# Patient Record
Sex: Female | Born: 1965
Health system: Southern US, Community
[De-identification: ages and names within clinical notes are randomized; demographics above are authoritative.]

## PROBLEM LIST (undated history)

## (undated) DIAGNOSIS — Z72820 Sleep deprivation: Secondary | ICD-10-CM

## (undated) DIAGNOSIS — K589 Irritable bowel syndrome without diarrhea: Secondary | ICD-10-CM

## (undated) DIAGNOSIS — R5382 Chronic fatigue, unspecified: Secondary | ICD-10-CM

## (undated) DIAGNOSIS — K648 Other hemorrhoids: Secondary | ICD-10-CM

## (undated) DIAGNOSIS — N3010 Interstitial cystitis (chronic) without hematuria: Secondary | ICD-10-CM

## (undated) DIAGNOSIS — G8929 Other chronic pain: Secondary | ICD-10-CM

## (undated) DIAGNOSIS — M6289 Other specified disorders of muscle: Secondary | ICD-10-CM

## (undated) DIAGNOSIS — K219 Gastro-esophageal reflux disease without esophagitis: Secondary | ICD-10-CM

## (undated) DIAGNOSIS — F32A Depression, unspecified: Secondary | ICD-10-CM

## (undated) DIAGNOSIS — C539 Malignant neoplasm of cervix uteri, unspecified: Secondary | ICD-10-CM

## (undated) DIAGNOSIS — I10 Essential (primary) hypertension: Secondary | ICD-10-CM

## (undated) DIAGNOSIS — M797 Fibromyalgia: Secondary | ICD-10-CM

## (undated) DIAGNOSIS — F329 Major depressive disorder, single episode, unspecified: Secondary | ICD-10-CM

## (undated) HISTORY — DX: Irritable bowel syndrome without diarrhea: K58.9

## (undated) HISTORY — PX: BLADDER SURGERY: SHX569

## (undated) HISTORY — DX: Essential (primary) hypertension: I10

## (undated) HISTORY — DX: Interstitial cystitis (chronic) without hematuria: N30.10

## (undated) HISTORY — DX: Gastro-esophageal reflux disease without esophagitis: K21.9

## (undated) HISTORY — DX: Sleep deprivation: Z72.820

## (undated) HISTORY — DX: Fibromyalgia: M79.7

## (undated) HISTORY — DX: Chronic fatigue, unspecified: R53.82

## (undated) HISTORY — DX: Malignant neoplasm of cervix uteri, unspecified: C53.9

## (undated) HISTORY — DX: Major depressive disorder, single episode, unspecified: F32.9

## (undated) HISTORY — DX: Other specified disorders of muscle: M62.89

## (undated) HISTORY — PX: ABDOMINAL HYSTERECTOMY: SHX81

## (undated) HISTORY — DX: Irritable bowel syndrome, unspecified: K58.9

## (undated) HISTORY — PX: APPENDECTOMY: SHX54

## (undated) HISTORY — DX: Depression, unspecified: F32.A

## (undated) HISTORY — DX: Other hemorrhoids: K64.8

## (undated) HISTORY — DX: Other chronic pain: G89.29

---

## 1998-12-06 ENCOUNTER — Emergency Department (HOSPITAL_COMMUNITY): Admission: EM | Admit: 1998-12-06 | Discharge: 1998-12-06 | Payer: Self-pay | Admitting: Emergency Medicine

## 1998-12-06 ENCOUNTER — Encounter: Payer: Self-pay | Admitting: Emergency Medicine

## 2001-08-15 ENCOUNTER — Encounter: Payer: Self-pay | Admitting: Urology

## 2001-08-15 ENCOUNTER — Encounter: Admission: RE | Admit: 2001-08-15 | Discharge: 2001-08-15 | Payer: Self-pay | Admitting: Urology

## 2001-12-09 ENCOUNTER — Ambulatory Visit (HOSPITAL_BASED_OUTPATIENT_CLINIC_OR_DEPARTMENT_OTHER): Admission: RE | Admit: 2001-12-09 | Discharge: 2001-12-09 | Payer: Self-pay | Admitting: Urology

## 2001-12-09 ENCOUNTER — Encounter (INDEPENDENT_AMBULATORY_CARE_PROVIDER_SITE_OTHER): Payer: Self-pay | Admitting: *Deleted

## 2004-10-21 ENCOUNTER — Ambulatory Visit (HOSPITAL_COMMUNITY): Admission: RE | Admit: 2004-10-21 | Discharge: 2004-10-21 | Payer: Self-pay | Admitting: Urology

## 2004-10-21 ENCOUNTER — Ambulatory Visit (HOSPITAL_BASED_OUTPATIENT_CLINIC_OR_DEPARTMENT_OTHER): Admission: RE | Admit: 2004-10-21 | Discharge: 2004-10-21 | Payer: Self-pay | Admitting: Urology

## 2007-05-20 ENCOUNTER — Ambulatory Visit (HOSPITAL_BASED_OUTPATIENT_CLINIC_OR_DEPARTMENT_OTHER): Admission: RE | Admit: 2007-05-20 | Discharge: 2007-05-20 | Payer: Self-pay | Admitting: Urology

## 2008-08-07 DIAGNOSIS — I10 Essential (primary) hypertension: Secondary | ICD-10-CM | POA: Diagnosis present

## 2010-01-07 ENCOUNTER — Ambulatory Visit: Payer: Self-pay | Admitting: Internal Medicine

## 2010-01-23 ENCOUNTER — Ambulatory Visit: Payer: Self-pay | Admitting: Internal Medicine

## 2010-01-23 ENCOUNTER — Ambulatory Visit (HOSPITAL_COMMUNITY): Admission: RE | Admit: 2010-01-23 | Discharge: 2010-01-23 | Payer: Self-pay | Admitting: Internal Medicine

## 2010-04-02 ENCOUNTER — Encounter: Payer: Self-pay | Admitting: Cardiology

## 2010-04-17 ENCOUNTER — Encounter: Payer: Self-pay | Admitting: Cardiology

## 2010-05-03 ENCOUNTER — Encounter: Payer: Self-pay | Admitting: Cardiology

## 2010-05-26 ENCOUNTER — Encounter: Payer: Self-pay | Admitting: Cardiology

## 2010-05-27 ENCOUNTER — Ambulatory Visit (INDEPENDENT_AMBULATORY_CARE_PROVIDER_SITE_OTHER): Payer: Medicare Other | Admitting: Cardiology

## 2010-05-27 ENCOUNTER — Encounter: Payer: Self-pay | Admitting: Cardiology

## 2010-05-27 DIAGNOSIS — I498 Other specified cardiac arrhythmias: Secondary | ICD-10-CM

## 2010-05-27 DIAGNOSIS — R002 Palpitations: Secondary | ICD-10-CM

## 2010-05-27 DIAGNOSIS — R000 Tachycardia, unspecified: Secondary | ICD-10-CM | POA: Insufficient documentation

## 2010-05-27 DIAGNOSIS — R001 Bradycardia, unspecified: Secondary | ICD-10-CM | POA: Insufficient documentation

## 2010-05-27 DIAGNOSIS — I10 Essential (primary) hypertension: Secondary | ICD-10-CM | POA: Insufficient documentation

## 2010-05-27 NOTE — Progress Notes (Addendum)
Clinical Summary Carol Thompson is a 45 y.o.female referred for cardiology consultation. She reports a history of hypertension dating back over the last several years, actually evaluated by a hypertension specialist at [facility] back in 2009, by her report. She states that she has been on a combination of Toprol-XL, Cardizem CD, and Maxzide since that time, at different doses over the years. It seems the doses have changed based on both fluctuating heart rate and blood pressure concerns.  She reports a general sense of palpitations, describing fairly sudden onset of fast heartbeats that occur at rest, nearly on a daily basis. She also has occasional dizziness, reports syncope at times, although the description almost sounds more neurocardiogenic in etiology. She states that when her heart rate is in the "40s" she feels dizzy, although on the other hand when she has passed out at times, it seems to be situationally related, and not necessarily associated with frank hypotension.  More recently, she has noted slow heart rates resulting in a change in her Cardizem CD dosing to 120 mg p.o. T.i.d., rather than all at one dose. She seems to be tolerating this better. She states that her systolic blood pressure is typically no higher than "140" and that her diastolic blood pressure is typically in the "90s." She reports no exertional chest pain or unusual shortness of breath, although indicates that she is functionally limited related to her chronic illnesses and pain. She does not exercise regularly.  Today we discussed the importance of sodium restriction and diet as it relates to her blood pressure control. I reviewed her ECG, which shows no clear evidence of conduction system disease. We also discussed placing a Holter monitor to get a better sense of her true heart rate variability, as this may help Dr. Dimas Aguas to further craft her medical regimen going forward. I note that she has undergone screening for  pheochromocytoma, with negative results.  Allergies  Allergen Reactions  . Ciprofloxacin     Current Outpatient Prescriptions on File Prior to Visit  Medication Sig Dispense Refill  . albuterol (VENTOLIN HFA) 108 (90 BASE) MCG/ACT inhaler Inhale 2 puffs into the lungs every 6 (six) hours as needed.        . ALPRAZolam (XANAX) 1 MG tablet Take 1/2 by mouth in morning and afternoon, and 1 at bedtime       . amitriptyline (ELAVIL) 25 MG tablet Take 25 mg by mouth at bedtime.        Marland Kitchen APAP-Isometheptene-Dichloral (MIDRIN) 325-65-100 MG CAPS As needed        . baclofen (LIORESAL) 20 MG tablet Take 20 mg by mouth 3 (three) times daily.        Marland Kitchen estradiol (ESTRACE) 2 MG tablet Take 2 mg by mouth daily.        . Fluticasone-Salmeterol (ADVAIR DISKUS) 100-50 MCG/DOSE AEPB Inhale 1 puff into the lungs every 12 (twelve) hours.        . furosemide (LASIX) 20 MG tablet Take one by mouth daily        . Lidocaine 0.5 % GEL Apply topically.        . metoprolol (TOPROL-XL) 100 MG 24 hr tablet Take 100 mg by mouth daily.        Marland Kitchen omeprazole (PRILOSEC) 20 MG capsule Take one by mouth twice daily        . oxyCODONE (OXYCONTIN) 20 MG 12 hr tablet Take one by mouth three times daily        .  pentosan polysulfate (ELMIRON) 100 MG capsule Take one by mouth four times daily        . sodium chloride 0.9 % SOLN 50 mL with bcg vaccine 81 MG/VIAL SUSR 81 mg 2 times a day       . triamterene-hydrochlorothiazide (MAXZIDE-25) 37.5-25 MG per tablet Take 1 tablet by mouth daily.        Marland Kitchen trimethoprim (TRIMPEX) 100 MG tablet Take one by mouth daily        . zolpidem (AMBIEN) 10 MG tablet Take 10 mg by mouth at bedtime as needed.        Marland Kitchen DISCONTD: diltiazem (CARDIZEM CD) 360 MG 24 hr capsule Take 360 mg by mouth daily.          Past Medical History  Diagnosis Date  . Cystitis, interstitial   . Fibromyalgia   . Hypertension   . Migraine   . Sleep deprivation   . Acid reflux   . Asthma   . Pelvic floor dysfunction    . Vulvodynia   . Chronic pain   . Chronic fatigue   . IBS (irritable bowel syndrome)   . Internal hemorrhoids     Colonoscopy 11/11 - Dr. Karilyn Cota   No past surgical history on file.  Family History  Problem Relation Age of Onset  . Coronary artery disease Father   . Lung cancer Father   . Kidney failure Mother     s/p renal transplantation  . Parkinsonism Brother     Social History Ms. Romaniello reports that she has never smoked. She has never used smokeless tobacco. Ms. Asher reports that she does not drink alcohol.  Review of Systems No fevers, chills, or unusual weight change. No chest pain, progressive shortness of breath, cough, hemoptysis, or wheezing. No dysphasia or odynophagia. Stable appetite with intermittent abdominal pain, No recent melena or hematochezia. No orthopnea or PND. Occasional mild dependent lower extremity edema. She reports intermittent bladder and pelvic pain. No focal motor weakness, memory problems, or speech deficits. Otherwise systems reviewed and negative except as already outlined.  Physical Examination Filed Vitals:   05/27/10 0911  BP: 131/85  Pulse: 80  Overweight woman in no acute distress. HEENT: Conjunctiva and lids normal, oropharynx clear. Neck: Supple, no elevated JVP or carotid bruits, no thyromegaly. Lungs: Clear to auscultation, nonlabored. Cardiac: Regular rate and rhythm, no significant systolic murmur, no gallop or rub. Abdomen: Soft, nontender, bowel sounds present. Skin: Warm and dry. Musculoskeletal: No kyphosis. Extremities: No pitting edema, distal pulses 1-2+. Neuropsychiatric: Alert and oriented x3, affect grossly appropriate.  ECG Normal sinus rhythm at 74 beats per minute with normal intervals.  Problem List and Plan Bradycardia Patient reports bradycardia, heart rates down to the 40s and 50s at times, associated with dizziness. Her heart rate today is essentially normal as is her ECG, showing no clear evidence of  conduction system disease. Certainly, she is on medications that would slow her heart rate, although I suspect that some of her previously described syncopal events are actually more neurocardiogenic in etiology. As noted above, a Holter monitor will be placed to get a better sense of true heart rate variability, and to exclude any pauses.  Unspecified essential hypertension Today's blood pressure is only mildly elevated, and it sounds as if Dr. Dimas Aguas has made careful medication adjustments over time. I am therefore reluctant to make any medication modifications empirically today. Once more heart rate variability information is available, it may be that this will provide useful  information regarding future medication adjustments. Otherwise I did discuss diet and sodium restriction with her.  Palpitations She has a reported history of "tachycardia" based on record review, and she does describe episodic palpitations. It is not clear whether this represents sinus tachycardia, or perhaps some other SVT. A Holter monitor will be placed to get a better sense of this, and her heart rate variability. No medication changes will be made at this point. Our office will contact her with the results, and these will be forwarded to Dr. Dimas Aguas.

## 2010-05-27 NOTE — Assessment & Plan Note (Signed)
She has a reported history of "tachycardia" based on record review, and she does describe episodic palpitations. It is not clear whether this represents sinus tachycardia, or perhaps some other SVT. A Holter monitor will be placed to get a better sense of this, and her heart rate variability. No medication changes will be made at this point. Our office will contact her with the results, and these will be forwarded to Dr. Dimas Aguas.

## 2010-05-27 NOTE — Patient Instructions (Signed)
Your physician has recommended that you wear a holter monitor. Holter monitors are medical devices that record the heart's electrical activity. Doctors most often use these monitors to diagnose arrhythmias. Arrhythmias are problems with the speed or rhythm of the heartbeat. The monitor is a small, portable device. You can wear one while you do your normal daily activities. This is usually used to diagnose what is causing palpitations/syncope (passing out). Your follow up will be based on your test results. If the results of your test are normal or stable, you will receive a letter. If they are abnormal, the nurse will contact you by phone.

## 2010-05-27 NOTE — Assessment & Plan Note (Addendum)
Today's blood pressure is only mildly elevated, and it sounds as if Dr. Dimas Aguas has made careful medication adjustments over time. I am therefore reluctant to make any medication modifications empirically today. Once more heart rate variability information is available, it may be that this will provide useful information regarding future medication adjustments. Otherwise I did discuss diet and sodium restriction with her.

## 2010-05-27 NOTE — Assessment & Plan Note (Signed)
Patient reports bradycardia, heart rates down to the 40s and 50s at times, associated with dizziness. Her heart rate today is essentially normal as is her ECG, showing no clear evidence of conduction system disease. Certainly, she is on medications that would slow her heart rate, although I suspect that some of her previously described syncopal events are actually more neurocardiogenic in etiology. As noted above, a Holter monitor will be placed to get a better sense of true heart rate variability, and to exclude any pauses.

## 2010-06-05 NOTE — Letter (Signed)
Summary: External Correspondence/  [facility]  External Correspondence/  [facility]   Imported By: Dorise Hiss 05/26/2010 16:17:02  _____________________________________________________________________  External Attachment:    Type:   Image     Comment:   External Document

## 2010-06-05 NOTE — Medication Information (Signed)
Summary: RX Folder/  [facility]  RX Folder/  [facility]   Imported By: Dorise Hiss 05/26/2010 16:20:30  _____________________________________________________________________  External Attachment:    Type:   Image     Comment:   External Document

## 2010-06-05 NOTE — Letter (Signed)
Summary: External Correspondence/  [facility]  External Correspondence/  [facility]   Imported By: Dorise Hiss 05/26/2010 16:15:27  _____________________________________________________________________  External Attachment:    Type:   Image     Comment:   External Document

## 2010-06-05 NOTE — Letter (Signed)
Summary: External Correspondence/  [facility]  External Correspondence/  [facility]   Imported By: Dorise Hiss 05/26/2010 16:14:09  _____________________________________________________________________  External Attachment:    Type:   Image     Comment:   External Document

## 2010-07-03 DIAGNOSIS — D649 Anemia, unspecified: Secondary | ICD-10-CM | POA: Insufficient documentation

## 2010-07-03 DIAGNOSIS — K219 Gastro-esophageal reflux disease without esophagitis: Secondary | ICD-10-CM

## 2010-07-03 DIAGNOSIS — G5600 Carpal tunnel syndrome, unspecified upper limb: Secondary | ICD-10-CM | POA: Insufficient documentation

## 2010-07-03 DIAGNOSIS — N946 Dysmenorrhea, unspecified: Secondary | ICD-10-CM | POA: Insufficient documentation

## 2010-07-03 DIAGNOSIS — K5900 Constipation, unspecified: Secondary | ICD-10-CM | POA: Insufficient documentation

## 2010-07-22 NOTE — Op Note (Signed)
Carol Thompson, Carol Thompson NO.:  [id number]   MEDICAL RECORD NO.:  [id number]          PATIENT TYPE:  AMB   LOCATION:  [facility]                         FACILITY:  [facility]   PHYSICIAN:  Jamison Neighbor, M.D.  DATE OF BIRTH:  05-12-1965   DATE OF PROCEDURE:  05/20/2007  DATE OF DISCHARGE:                               OPERATIVE REPORT   PREOPERATIVE DIAGNOSES:  1. Interstitial cystitis.  2. Urgency incontinence.   POSTOPERATIVE DIAGNOSES:  1. Interstitial cystitis.  2. Urgency incontinence.   PROCEDURE:  Cystoscopy, urethral calibration, hydrodistention of the  bladder, Marcaine and Pyridium installation, Marcaine and Kenalog  injection, Botox injections x2.   SURGEON:  Dr. Logan Bores.   ANESTHESIA:  Was general.   COMPLICATIONS:  None.   DRAINS:  None.   BRIEF HISTORY:  This patient has had longstanding problems with not only  interstitial cystitis and associated pelvic pain but very poorly  controlled urgency and frequency.  She has failed to respond  particularly well to oral therapy or installation therapy.  She has  requested repeat hydrodistention be performed, and she has had good  response to that in the past, so far as pain is concerned.  She is also  interested in a trial of Botox injections for her frequency and urgency.  We discussed the various options including neural modulation therapy as  well as ongoing anticholinergic therapy, but she would like to try the  Botox injection.  She had been fully appraised as to the potential risk  of urinary retention but does realize that the Botox will wear off in  the range of 4 to 6 months and that any problem that she has with  retention will certainly not be permanent.  She is willing to self-  catheterization if indicated.  The patient understands the risks and  benefits of the procedure and gave full informed consent.   PROCEDURE:  After successful induction of general anesthesia, the  patient was placed in  the dorsal position, prepped with Betadine, and  draped in the usual sterile fashion.  Careful bimanual examination  revealed an unremarkable urethra.  There were no signs of the  diverticula.  The patient had minimal cystocele.  She had no vault  prolapse or rectocele.  There was no enterocele.  There were no palpable  masses of any kind.  Urethra was calibrated to 32-French with female  urethral sounds with no evidence of stenosis or stricture.  The  cystoscope was inserted.  The bladder was carefully inspected.  No  tumors or stones could be seen.  An old biopsy site was identified but  was unremarkable.  The urine coming out of each ureter was clear .  Hydrodistention of bladder was performed.  The bladder was distended at  a pressure of 170 units of water for 5 minutes.  When the bladder was  drained, glomerulations could be seen throughout the bladder.  Bladder  capacity of 475 was slightly less than the average IC capacity of 575  and markedly less than the expected normal bladder capacity of 1150 mL  under anesthesia.  The patient had nothing that required biopsy.  There  was no sign of any Hunner's ulcers that would require fulguration.  The  patient underwent Botox injection.  She received a total of 2 ampules  injected throughout the base of the bladder.  The patient tolerated the  procedure without difficulty.  Marcaine and Pyridium were left in the  bladder.  Marcaine and Kenalog were injected periurethrally.  The  patient tolerated the procedure well and was taken to the recovery in  good condition.  She was given Pyridium Plus for symptom management as  well as doxycycline for antibiotic coverage.  She will return to see me  in follow-up in 2 weeks' time.  The patient did receive intraoperative  Toradol, Zofran, and a B&O suppository for symptom management.      Jamison Neighbor, M.D.  Electronically Signed     RJE/MEDQ  D:  05/20/2007  T:  05/21/2007  Job:  [id number]

## 2010-07-25 NOTE — Op Note (Signed)
NAME:  Carol Thompson, Carol Thompson                         ACCOUNT NO.:  [id number]   MEDICAL RECORD NO.:  [id number]                   PATIENT TYPE:  AMB   LOCATION:  [facility]                                 FACILITY:  [facility]   PHYSICIAN:  Jamison Neighbor, M.D.               DATE OF BIRTH:  04-Jul-1965   DATE OF PROCEDURE:  12/09/2001  DATE OF DISCHARGE:                                 OPERATIVE REPORT   SERVICE:  Urology.   PREOPERATIVE DIAGNOSES:  Chronic pelvic pain, rule out interstitial  cystitis.   POSTOPERATIVE DIAGNOSES:  Interstitial cystitis.   PROCEDURE:  Cystoscopy, urethral calibration, hydrodistention of the  bladder, Marcaine and Pyridium installation, Marcaine and Kenalog injection.   SURGEON:  Jamison Neighbor, M.D.   ANESTHESIA:  General.   COMPLICATIONS:  None.   DRAINS:  None.   BRIEF HISTORY:  This 45 year old female has had lower urinary tract  symptoms, she had been on antibiotic suppression and is no longer infected  but still has no change in her urgency, frequency, and pelvic pain. The  patient is to undergo diagnostic cystoscopy under anesthesia. She is aware  that this is primarily being done to confirm a diagnosis of IC.  She was  given the opportunity to alternative forms of therapy such as potassium  provocation. The patient understands that there is no guarantee that she  will have any relief from this procedure but there is certainly a reasonably  chance that she may have relief that can last as long as several months, she  gave full and informed consent for the procedure.   DESCRIPTION OF PROCEDURE:  After successful induction of general anesthesia,  the patient was placed in the dorsal lithotomy position, prepped with  Betadine and draped in the usual sterile fashion. Careful bimanual  examination revealed no cystocele, rectocele or enterocele. There were no  masses on bimanual exam. There was no evidence of diverticulum. The urethra  was calibrated to  a 11 [place] with female urethral sounds with no evidence  of stenosis or stricture. The cystoscope was inserted, the bladder was  carefully inspected and was free of any tumor or stones. Both ureteral  orifices were normal in configuration and location. The patient underwent  hydrodistention at a pressure of 100 cm of water for five minutes when the  bladder was drained. Glomerulations could seen throughout the bladder and  bladder capacity was 700 cc. The patient had a bladder biopsy done. This  biopsy was sent for mast cell analysis. The biopsy site was cauterized, the  bladder was drained and a mixture of Marcaine and Pyridium was left within  the bladder, Marcaine and Kenalog were injected periurethrally. The patient  received intraoperative  Toradol and Zofran as well as a B&O suppository. She will be sent home with  Lorcet 10 and UroMax and she will continue on her Macrodantin for  prophylaxis. When she returns  to the office in follow-up, she will be given  the opportunity for installation therapy or oral therapy for the management  of her condition.                                               Jamison Neighbor, M.D.    RJE/MEDQ  D:  12/09/2001  T:  12/09/2001  Job:  [id number]   cc:   Selinda Flavin  [address]  [place]  [place] [address]  Fax: [phone]

## 2010-07-25 NOTE — Op Note (Signed)
Carol Thompson, BINEGAR NO.:  [id number]   MEDICAL RECORD NO.:  [id number]          PATIENT TYPE:  AMB   LOCATION:  [facility]                         [facility]ACILITY:  [facility]   PHYSICIAN:  Jamison Neighbor, M.D.  DATE OF BIRTH:  1965/10/09   DATE OF PROCEDURE:  10/21/2004  DATE OF DISCHARGE:                                 OPERATIVE REPORT   SERVICE:  Urology.   PREOPERATIVE DIAGNOSES:  Interstitial cystitis.   POSTOPERATIVE DIAGNOSES:  Interstitial cystitis.   PROCEDURE:  Cystoscopy, urethral calibration, hydrodistention of the  bladder, Marcaine and Pyridium installation, Marcaine and Kenalog injection.   SURGEON:  Jamison Neighbor, M.D.   ANESTHESIA:  General.   COMPLICATIONS:  None.   DRAINS:  None.   BRIEF HISTORY:  This 45 year old female has well documented interstitial  cystitis having undergone previous cystoscopy and hydrodistention with good  results. The patient has been on a combination of Elmiron and Atarax along  with pain management and has done reasonably well but has had deterioration  in her symptoms. She has requested a repeat hydrodistention be performed to  determine if there has been any change in her bladder as well as to  hopefully decrease her pain. The patient understands the risks and benefits  of the procedure and gave full and informed consent.   DESCRIPTION OF PROCEDURE:  After successful induction of general anesthesia,  the patient was placed in the dorsal lithotomy position, prepped with  Betadine and draped in the usual sterile fashion. Careful bimanual  examination revealed no significant cystocele, rectocele or enterocele.  There were no masses on bimanual exam. The urethra was calibrated to 63  [place] with female urethral sounds with no evidence of stenosis or  stricture. The cystoscope was inserted, the bladder was carefully inspected  and was free of any tumor or stones. Both ureteral orifices were normal in  configuration and  location. There is very minimal unremarkable appearing  squamous metaplasia in the midline. The urine coming out of each ureter was  clear. Hydrodistention of the bladder was then performed, the bladder was  distended at a pressure of 100 cmH2O for 5 minutes and when the bladder was  drained, glomerulations could be seen but they are significantly less than  had been seen previously. The bladder capacity is now at 700 mL which is a  little better than the average for interstitial cystitis which is 575 but  somewhat less than  the normal bladder capacity of 1150. The bladder was drained and a mixture  of Marcaine and Pyridium was left within the bladder,  Marcaine and Kenalog  mixture was injected periurethrally. The patient tolerated the procedure  well and was taken to the recovery room in good condition.           ______________________________  Jamison Neighbor, M.D.  Electronically Signed     RJE/MEDQ  D:  10/21/2004  T:  10/21/2004  Job:  [id number]   cc:   Selinda Flavin  [address]  [place]  [place] [address]  Fax: [phone]

## 2010-12-01 LAB — POCT HEMOGLOBIN-HEMACUE: Hemoglobin: 13.2

## 2010-12-17 DIAGNOSIS — N3941 Urge incontinence: Secondary | ICD-10-CM | POA: Insufficient documentation

## 2011-04-30 DIAGNOSIS — R079 Chest pain, unspecified: Secondary | ICD-10-CM | POA: Insufficient documentation

## 2011-05-07 ENCOUNTER — Encounter: Payer: Self-pay | Admitting: Family Medicine

## 2011-05-07 DIAGNOSIS — R079 Chest pain, unspecified: Secondary | ICD-10-CM

## 2011-06-02 ENCOUNTER — Ambulatory Visit (INDEPENDENT_AMBULATORY_CARE_PROVIDER_SITE_OTHER): Payer: Medicare Other | Admitting: Cardiology

## 2011-06-02 ENCOUNTER — Encounter: Payer: Self-pay | Admitting: Cardiology

## 2011-06-02 VITALS — BP 109/76 | HR 71 | Ht 61.0 in | Wt 148.0 lb

## 2011-06-02 DIAGNOSIS — R002 Palpitations: Secondary | ICD-10-CM

## 2011-06-02 DIAGNOSIS — R0789 Other chest pain: Secondary | ICD-10-CM | POA: Insufficient documentation

## 2011-06-02 DIAGNOSIS — R079 Chest pain, unspecified: Secondary | ICD-10-CM

## 2011-06-02 DIAGNOSIS — I10 Essential (primary) hypertension: Secondary | ICD-10-CM

## 2011-06-02 NOTE — Assessment & Plan Note (Signed)
As noted above, patient feels that these are more prominent. No actual syncope.

## 2011-06-02 NOTE — Assessment & Plan Note (Signed)
Etiology not clear. ECG is reviewed and overall nonspecific. Recent Lexiscan Cardiolite demonstrated no active ischemia. We plan to obtain an event recorder for 7 days to exclude any recurring arrhythmia that might be related in light of her progressive sense of palpitations and fatigue, also a complete echocardiogram for assessment of cardiac structure and function as well as valvular status, also to exclude elevated pulmonary pressures. Office follow up arranged to review.

## 2011-06-02 NOTE — Patient Instructions (Signed)
Follow up in 1 month. Your physician recommends that you continue on your current medications as directed. Please refer to the Current Medication list given to you today. Your physician has requested that you have an echocardiogram. Echocardiography is a painless test that uses sound waves to create images of your heart. It provides your doctor with information about the size and shape of your heart and how well your heart's chambers and valves are working. This procedure takes approximately one hour. There are no restrictions for this procedure. Your physician has recommended that you wear an event monitor. Event monitors are medical devices that record the heart's electrical activity. Doctors most often [place] these monitors to diagnose arrhythmias. Arrhythmias are problems with the speed or rhythm of the heartbeat. The monitor is a small, portable device. You can wear one while you do your normal daily activities. This is usually used to diagnose what is causing palpitations/syncope (passing out). If the results of your test are normal or stable, you will receive a letter. If they are abnormal, the nurse will contact you by phone.

## 2011-06-02 NOTE — Progress Notes (Signed)
Clinical Summary Carol Thompson is a 46 y.o.female referred back to the office by Dr. Dimas Aguas for evaluation of chest pain and palpitations. She was seen with palpitations in March of last year at which time she wore a 48 hour Holter monitor, read by Dr. Andee Lineman to show sinus rhythm with no significant arrhythmias or pauses. She had only rare PACs, heart rates noted in the high 40s to 50s at times in sinus rhythm.  I see that she was also evaluated by Dr. Dimas Aguas with a recent [facility] in February of this year. This study revealed no evidence of ischemia with normal LVEF. Lab work at that time showed BUN 13, creatinine 0.6, sodium 139, potassium 4.1, AST 16, ALT 14, hemoglobin 13.7, platelets 232, cholesterol 206, triglycerides 116, HDL 90, LDL 93, TSH 1.2, ESR normal.  She states that she experienced a chest discomfort in the evening several weeks ago, followed by feeling of fatigue that has persisted since February when she originally saw Dr. Dimas Aguas. She has also had intermittent palpitations. Chest pain symptoms are new by report, somewhat sharp in quality. She reports no cough or hemoptysis.  Today we reviewed the results of her recent testing as well as prior cardiac monitor. She has not had a recent echocardiogram, indicates her palpitations are worse.   Allergies  Allergen Reactions  . Ciprofloxacin Rash    Current Outpatient Prescriptions  Medication Sig Dispense Refill  . albuterol (VENTOLIN HFA) 108 (90 BASE) MCG/ACT inhaler Inhale 2 puffs into the lungs every 6 (six) hours as needed.        . ALPRAZolam (XANAX) 0.5 MG tablet Take 0.5 mg by mouth 2 (two) times daily.      . baclofen (LIORESAL) 20 MG tablet Take 20 mg by mouth 3 (three) times daily.        . cyproheptadine (PERIACTIN) 4 MG tablet Take 4 mg by mouth daily.      Marland Kitchen diltiazem (CARDIZEM) 30 MG tablet Take 30 mg by mouth 3 (three) times daily.      Marland Kitchen estradiol (ESTRACE) 2 MG tablet Take 2 mg by mouth daily.        .  Fluticasone-Salmeterol (ADVAIR DISKUS) 100-50 MCG/DOSE AEPB Inhale 1 puff into the lungs every 12 (twelve) hours.        . furosemide (LASIX) 20 MG tablet Take 20 mg by mouth daily as needed.       . hydrOXYzine (ATARAX) 25 MG tablet Take 50 mg by mouth at bedtime.       . Lidocaine 0.5 % GEL Apply topically.        . metoprolol (TOPROL-XL) 100 MG 24 hr tablet Take 100 mg by mouth daily.        . NON FORMULARY 2 (two) times daily. ANESTHETIC COCKTAIL      . omeprazole (PRILOSEC) 20 MG capsule Take 20 mg by mouth daily.       Marland Kitchen oxyCODONE-acetaminophen (PERCOCET) 10-325 MG per tablet Take 1 tablet by mouth Every 4 hours as needed.      . phenazopyridine (PYRIDIUM) 200 MG tablet Take 200 mg by mouth 3 (three) times daily as needed.      . trimethoprim (TRIMPEX) 100 MG tablet Take one by mouth daily        . zolpidem (AMBIEN) 10 MG tablet Take 10 mg by mouth at bedtime as needed.          Past Medical History  Diagnosis Date  . Cystitis, interstitial   .  Fibromyalgia   . Hypertension   . Migraine   . Sleep deprivation   . Acid reflux   . Asthma   . Pelvic floor dysfunction   . Vulvodynia   . Chronic pain   . Chronic fatigue   . IBS (irritable bowel syndrome)   . Internal hemorrhoids     Colonoscopy 11/11 - Dr. Karilyn Cota    History reviewed. No pertinent past surgical history.  Family History  Problem Relation Age of Onset  . Coronary artery disease Father   . Lung cancer Father   . Kidney failure Mother     s/p renal transplantation  . Parkinsonism Brother     Social History Carol Thompson reports that she has never smoked. She has never used smokeless tobacco. Carol Thompson reports that she does not drink alcohol.  Review of Systems Reports chronic problems with intermittent bladder and pelvic pain. Has occasional mild ankle edema. Feels progressively fatigued. Has had no syncope, occasionally feels lightheaded with palpitations. No bleeding problems. No fevers or chills. Otherwise  negative.  Physical Examination Filed Vitals:   06/02/11 1537  BP: 109/76  Pulse: 71   Overweight woman in no acute distress.  HEENT: Conjunctiva and lids normal, oropharynx clear.  Neck: Supple, no elevated JVP or carotid bruits, no thyromegaly.  Lungs: Clear to auscultation, nonlabored.  Cardiac: Regular rate and rhythm, no significant systolic murmur, no gallop or rub.  Abdomen: Soft, nontender, bowel sounds present.  Skin: Warm and dry.  Musculoskeletal: No kyphosis.  Extremities: No pitting edema, distal pulses 1-2+.  Neuropsychiatric: Alert and oriented x3, affect grossly appropriate.   ECG Normal sinus rhythm at 80 beats per minute, possible right atrial enlargement.   Problem List and Plan

## 2011-06-02 NOTE — Assessment & Plan Note (Signed)
Blood pressure is normal today. She reports that medical therapy is unchanged.

## 2011-06-10 ENCOUNTER — Telehealth: Payer: Self-pay | Admitting: *Deleted

## 2011-06-10 NOTE — Telephone Encounter (Signed)
Pt's insurance information in Epic is incorrect. Pt was asked last week to bring updated insurance card so that insurance information in Epic would be correct and cardiac monitor could be ordered. Spoke with pt today who forgot to bring card. She will bring this tomorrow.

## 2011-06-16 DIAGNOSIS — R002 Palpitations: Secondary | ICD-10-CM

## 2011-06-18 ENCOUNTER — Other Ambulatory Visit: Payer: Self-pay

## 2011-06-18 ENCOUNTER — Other Ambulatory Visit (INDEPENDENT_AMBULATORY_CARE_PROVIDER_SITE_OTHER): Payer: Medicare Other

## 2011-06-18 DIAGNOSIS — R079 Chest pain, unspecified: Secondary | ICD-10-CM

## 2011-06-18 DIAGNOSIS — R002 Palpitations: Secondary | ICD-10-CM

## 2011-06-18 DIAGNOSIS — R0789 Other chest pain: Secondary | ICD-10-CM

## 2011-06-23 ENCOUNTER — Encounter: Payer: Self-pay | Admitting: *Deleted

## 2011-06-25 ENCOUNTER — Encounter: Payer: Self-pay | Admitting: *Deleted

## 2011-07-07 ENCOUNTER — Ambulatory Visit: Payer: Medicare Other | Admitting: Cardiology

## 2011-09-02 DIAGNOSIS — R350 Frequency of micturition: Secondary | ICD-10-CM | POA: Insufficient documentation

## 2011-09-02 DIAGNOSIS — K589 Irritable bowel syndrome without diarrhea: Secondary | ICD-10-CM | POA: Insufficient documentation

## 2011-10-09 DIAGNOSIS — Z8679 Personal history of other diseases of the circulatory system: Secondary | ICD-10-CM | POA: Insufficient documentation

## 2011-10-09 DIAGNOSIS — J45902 Unspecified asthma with status asthmaticus: Secondary | ICD-10-CM | POA: Insufficient documentation

## 2012-09-01 DIAGNOSIS — J019 Acute sinusitis, unspecified: Secondary | ICD-10-CM | POA: Insufficient documentation

## 2012-11-15 DIAGNOSIS — N390 Urinary tract infection, site not specified: Secondary | ICD-10-CM | POA: Insufficient documentation

## 2012-11-15 DIAGNOSIS — R300 Dysuria: Secondary | ICD-10-CM | POA: Insufficient documentation

## 2013-01-23 DIAGNOSIS — J309 Allergic rhinitis, unspecified: Secondary | ICD-10-CM | POA: Insufficient documentation

## 2013-03-28 ENCOUNTER — Emergency Department (HOSPITAL_COMMUNITY): Payer: Medicare PPO

## 2013-03-28 ENCOUNTER — Emergency Department (HOSPITAL_COMMUNITY)
Admission: EM | Admit: 2013-03-28 | Discharge: 2013-03-28 | Disposition: A | Discharge status: Home / Self Care | Payer: Medicare PPO | Attending: Emergency Medicine | Admitting: Emergency Medicine

## 2013-03-28 ENCOUNTER — Encounter (HOSPITAL_COMMUNITY): Payer: Self-pay | Admitting: Emergency Medicine

## 2013-03-28 DIAGNOSIS — Z87448 Personal history of other diseases of urinary system: Secondary | ICD-10-CM | POA: Insufficient documentation

## 2013-03-28 DIAGNOSIS — Y929 Unspecified place or not applicable: Secondary | ICD-10-CM | POA: Insufficient documentation

## 2013-03-28 DIAGNOSIS — G8929 Other chronic pain: Secondary | ICD-10-CM | POA: Insufficient documentation

## 2013-03-28 DIAGNOSIS — S61209A Unspecified open wound of unspecified finger without damage to nail, initial encounter: Secondary | ICD-10-CM | POA: Insufficient documentation

## 2013-03-28 DIAGNOSIS — S61218A Laceration without foreign body of other finger without damage to nail, initial encounter: Secondary | ICD-10-CM

## 2013-03-28 DIAGNOSIS — W260XXA Contact with knife, initial encounter: Secondary | ICD-10-CM | POA: Insufficient documentation

## 2013-03-28 DIAGNOSIS — Z23 Encounter for immunization: Secondary | ICD-10-CM | POA: Insufficient documentation

## 2013-03-28 DIAGNOSIS — G43909 Migraine, unspecified, not intractable, without status migrainosus: Secondary | ICD-10-CM | POA: Insufficient documentation

## 2013-03-28 DIAGNOSIS — Z8742 Personal history of other diseases of the female genital tract: Secondary | ICD-10-CM | POA: Insufficient documentation

## 2013-03-28 DIAGNOSIS — K219 Gastro-esophageal reflux disease without esophagitis: Secondary | ICD-10-CM | POA: Insufficient documentation

## 2013-03-28 DIAGNOSIS — Z79899 Other long term (current) drug therapy: Secondary | ICD-10-CM | POA: Insufficient documentation

## 2013-03-28 DIAGNOSIS — I10 Essential (primary) hypertension: Secondary | ICD-10-CM | POA: Insufficient documentation

## 2013-03-28 DIAGNOSIS — IMO0002 Reserved for concepts with insufficient information to code with codable children: Secondary | ICD-10-CM | POA: Insufficient documentation

## 2013-03-28 DIAGNOSIS — Z72820 Sleep deprivation: Secondary | ICD-10-CM | POA: Insufficient documentation

## 2013-03-28 DIAGNOSIS — W261XXA Contact with sword or dagger, initial encounter: Secondary | ICD-10-CM

## 2013-03-28 DIAGNOSIS — Y939 Activity, unspecified: Secondary | ICD-10-CM | POA: Insufficient documentation

## 2013-03-28 DIAGNOSIS — J45909 Unspecified asthma, uncomplicated: Secondary | ICD-10-CM | POA: Insufficient documentation

## 2013-03-28 DIAGNOSIS — IMO0001 Reserved for inherently not codable concepts without codable children: Secondary | ICD-10-CM | POA: Insufficient documentation

## 2013-03-28 DIAGNOSIS — R51 Headache: Secondary | ICD-10-CM | POA: Insufficient documentation

## 2013-03-28 MED ORDER — OXYCODONE-ACETAMINOPHEN 5-325 MG PO TABS
1.0000 | ORAL_TABLET | Freq: Once | ORAL | Status: AC
  Administered 2013-03-28: 1 via ORAL
  Filled 2013-03-28: qty 1

## 2013-03-28 MED ORDER — LIDOCAINE HCL (PF) 2 % IJ SOLN
INTRAMUSCULAR | Status: AC
  Administered 2013-03-28: 20:00:00
  Filled 2013-03-28: qty 10

## 2013-03-28 MED ORDER — PROMETHAZINE HCL 12.5 MG PO TABS
12.5000 mg | ORAL_TABLET | Freq: Once | ORAL | Status: AC
  Administered 2013-03-28: 12.5 mg via ORAL
  Filled 2013-03-28: qty 1

## 2013-03-28 MED ORDER — TETANUS-DIPHTH-ACELL PERTUSSIS 5-2.5-18.5 LF-MCG/0.5 IM SUSP
0.5000 mL | Freq: Once | INTRAMUSCULAR | Status: AC
  Administered 2013-03-28: 0.5 mL via INTRAMUSCULAR
  Filled 2013-03-28: qty 0.5

## 2013-03-28 MED ORDER — OXYCODONE-ACETAMINOPHEN 5-325 MG PO TABS: 1.0000 | ORAL_TABLET | ORAL | Status: DC | PRN

## 2013-03-28 NOTE — ED Notes (Signed)
Lac to lt middle finger with carpet knife.

## 2013-03-28 NOTE — ED Notes (Signed)
Lac to LMF with carpet knife.  Active bleeding with small pumper.  Alert,

## 2013-03-28 NOTE — ED Provider Notes (Signed)
CSN: [id number]     Arrival date & time 03/28/13  1936 History   First MD Initiated Contact with Patient 03/28/13 2024     Chief Complaint  Patient presents with  . Laceration   (Consider location/radiation/quality/duration/timing/severity/associated sxs/prior Treatment) Patient is a 49 y.o. female presenting with skin laceration. The history is provided by the patient.  Laceration Location:  Hand Hand laceration location:  L finger Length (cm):  2.8 Depth:  Through dermis Quality: straight   Bleeding: arterial   Time since incident:  30 minutes Laceration mechanism:  Knife Pain details:    Quality:  Aching and throbbing   Severity:  Severe   Timing:  Constant   Progression:  Worsening Foreign body present:  No foreign bodies Ineffective treatments:  Pressure Tetanus status:  Out of date   Past Medical History  Diagnosis Date  . Cystitis, interstitial   . Fibromyalgia   . Hypertension   . Migraine   . Sleep deprivation   . Acid reflux   . Asthma   . Pelvic floor dysfunction   . Vulvodynia   . Chronic pain   . Chronic fatigue   . IBS (irritable bowel syndrome)   . Internal hemorrhoids     Colonoscopy 11/11 - Dr. Laural Golden   Past Surgical History  Procedure Laterality Date  . Bladder surgery    . Abdominal hysterectomy    . Appendectomy     Family History  Problem Relation Age of Onset  . Coronary artery disease Father   . Lung cancer Father   . Kidney failure Mother     s/p renal transplantation  . Parkinsonism Brother    History  Substance Use Topics  . Smoking status: Never Smoker   . Smokeless tobacco: Never Used  . Alcohol Use: No   OB History   Grav Para Term Preterm Abortions TAB SAB Ect Mult Living                 Review of Systems  Constitutional: Negative for activity change.       All ROS Neg except as noted in HPI  HENT: Negative for nosebleeds.   Eyes: Negative for photophobia and discharge.  Respiratory: Negative for cough, shortness  of breath and wheezing.   Cardiovascular: Negative for chest pain and palpitations.  Gastrointestinal: Negative for abdominal pain and blood in stool.  Genitourinary: Negative for dysuria, frequency and hematuria.  Musculoskeletal: Positive for myalgias. Negative for arthralgias, back pain and neck pain.  Skin: Negative.   Neurological: Positive for headaches. Negative for dizziness, seizures and speech difficulty.  Psychiatric/Behavioral: Negative for hallucinations and confusion.    Allergies  Ciprofloxacin  Home Medications   Current Outpatient Rx  Name  Route  Sig  Dispense  Refill  . albuterol (VENTOLIN HFA) 108 (90 BASE) MCG/ACT inhaler   Inhalation   Inhale 2 puffs into the lungs every 6 (six) hours as needed.           . ALPRAZolam (XANAX) 0.5 MG tablet   Oral   Take 0.5 mg by mouth 2 (two) times daily.         . baclofen (LIORESAL) 20 MG tablet   Oral   Take 20 mg by mouth 3 (three) times daily.           Marland Kitchen diltiazem (CARDIZEM) 30 MG tablet   Oral   Take 30 mg by mouth 3 (three) times daily.         Marland Kitchen estradiol (  ESTRACE) 2 MG tablet   Oral   Take 2 mg by mouth daily.           . Fluticasone-Salmeterol (ADVAIR DISKUS) 100-50 MCG/DOSE AEPB   Inhalation   Inhale 1 puff into the lungs every 12 (twelve) hours.           . furosemide (LASIX) 20 MG tablet   Oral   Take 20 mg by mouth daily as needed.          . heparin 5000 UNIT/ML injection   Intravenous   Inject 8 mLs into the vein every 4 (four) hours.         . hydrOXYzine (ATARAX) 25 MG tablet   Oral   Take 25 mg by mouth at bedtime.          . lidocaine (XYLOCAINE) 2 % injection   Infiltration   10 mLs by Infiltration route every 4 (four) hours.         . Lidocaine 0.5 % GEL   Apply externally   Apply topically.           . metoprolol (TOPROL-XL) 100 MG 24 hr tablet   Oral   Take 100 mg by mouth daily.           Marland Kitchen omeprazole (PRILOSEC) 20 MG capsule   Oral   Take 20 mg  by mouth daily.          Marland Kitchen oxyCODONE-acetaminophen (PERCOCET) 10-325 MG per tablet   Oral   Take 1 tablet by mouth Every 4 hours as needed for pain.          . phenazopyridine (PYRIDIUM) 200 MG tablet   Oral   Take 200 mg by mouth 3 (three) times daily as needed.         . sodium bicarbonate 1 mEq/mL injection   Intraperitoneal   Inject 5 mLs into the peritoneum every 4 (four) hours.         Marland Kitchen trimethoprim (TRIMPEX) 100 MG tablet      Take one by mouth daily           . zolpidem (AMBIEN) 10 MG tablet   Oral   Take 10 mg by mouth at bedtime as needed for sleep.          Marland Kitchen oxyCODONE-acetaminophen (PERCOCET/ROXICET) 5-325 MG per tablet   Oral   Take 1 tablet by mouth every 4 (four) hours as needed for severe pain.   6 tablet   0    BP 131/90  Pulse 91  Temp(Src) 98.1 F (36.7 C) (Oral)  Resp 18  Ht 5\' 1"  (1.549 m)  Wt 138 lb (62.596 kg)  BMI 26.09 kg/m2  SpO2 99% Physical Exam  Nursing note and vitals reviewed. Constitutional: She is oriented to person, place, and time. She appears well-developed and well-nourished.  Non-toxic appearance.  HENT:  Head: Normocephalic.  Right Ear: Tympanic membrane and external ear normal.  Left Ear: Tympanic membrane and external ear normal.  Eyes: EOM and lids are normal. Pupils are equal, round, and reactive to light.  Neck: Normal range of motion. Neck supple. Carotid bruit is not present.  Cardiovascular: Normal rate, regular rhythm, normal heart sounds, intact distal pulses and normal pulses.   Pulmonary/Chest: Breath sounds normal. No respiratory distress.  Abdominal: Soft. Bowel sounds are normal. There is no tenderness. There is no guarding.  Musculoskeletal: Normal range of motion.  Patient has a laceration to the palmar surface of the left third  finger. There is a small arterial bleeder noted. There is good range of motion of the finger. Visualization is limited at this point, but no tendon involvement seen.   Lymphadenopathy:       Head (right side): No submandibular adenopathy present.       Head (left side): No submandibular adenopathy present.    She has no cervical adenopathy.  Neurological: She is alert and oriented to person, place, and time. She has normal strength. No cranial nerve deficit or sensory deficit.  Skin: Skin is warm and dry.  Psychiatric: She has a normal mood and affect. Her speech is normal.    ED Course  LACERATION REPAIR Date/Time: 03/28/2013 10:31 PM Performed by: Lenox Ahr Authorized by: Lenox Ahr Consent: Verbal consent obtained. Risks and benefits: risks, benefits and alternatives were discussed Consent given by: patient Patient understanding: patient states understanding of the procedure being performed Patient identity confirmed: arm band Time out: Immediately prior to procedure a "time out" was called to verify the correct patient, procedure, equipment, support staff and site/side marked as required. Body area: upper extremity Location details: left long finger Laceration length: 2.8 cm Foreign bodies: no foreign bodies Tendon involvement: none Nerve involvement: none Anesthesia: local infiltration Local anesthetic: lidocaine 2% without epinephrine Patient sedated: no Preparation: Patient was prepped and draped in the usual sterile fashion. Irrigation solution: saline Amount of cleaning: standard Debridement: none Degree of undermining: none Skin closure: 4-0 nylon Wound subcutaneous closure material used: 2 arterial pumpers ligated with four sutures of 5-0 chromic. Number of sutures: 11 Technique: simple Approximation: close Approximation difficulty: complex Dressing: gauze roll Patient tolerance: Patient tolerated the procedure well with no immediate complications.    Labs Review Labs Reviewed - No data to display Imaging Review Dg Finger Middle Left  03/28/2013   CLINICAL DATA:  Laceration to the left third finger.  EXAM:  LEFT MIDDLE FINGER 2+V  COMPARISON:  None.  FINDINGS: There is soft tissue disruption at the third digit, without evidence of osseous disruption. Visualized joint spaces are preserved. No radiopaque foreign bodies are seen.  IMPRESSION: No evidence of osseous disruption. No radiopaque foreign bodies seen.   Electronically Signed   By: Garald Balding M.D.   On: 03/28/2013 21:44    EKG Interpretation   None       MDM  No diagnosis found. **I have reviewed nursing notes, vital signs, and all appropriate lab and imaging results for this patient.*  X-ray of the left third finger is negative for fracture or foreign body. The wound was repaired with 4-0 nylon. The 2 arterial pumpers were ligated with 5-0 chromic sutures.  Patient's tetanus status was updated. Prescription for Percocet one every 6 hours given to the patient. Patient is to have sutures removed in 7 days. She will return solar if any changes or problems. Will  Lenox Ahr, PA-C 03/29/13 1555

## 2013-03-28 NOTE — ED Notes (Signed)
6 pack of percocet disp to pt.

## 2013-03-28 NOTE — Discharge Instructions (Signed)
Please keep the wound clean and dry. Please change dressing daily. Please have sutures removed in 7-10 days. Please use Tylenol or ibuprofen for mild pain, use Percocet for more severe pain. This medication may cause drowsiness, and/or constipation. Please use with caution. Please see your primary physician, or return to the emergency department if any signs of infection. Your x-ray is negative for fracture or bony involvement. Sutured Wound Care Sutures are stitches that can be used to close wounds. Wound care helps prevent pain and infection.  HOME CARE INSTRUCTIONS   Rest and elevate the injured area until all the pain and swelling are gone.  Only take over-the-counter or prescription medicines for pain, discomfort, or fever as directed by your caregiver.  After 48 hours, gently wash the area with mild soap and water once a day, or as directed. Rinse off the soap. Pat the area dry with a clean towel. Do not rub the wound. This may cause bleeding.  Follow your caregiver's instructions for how often to change the bandage (dressing). Stop using a dressing after 2 days or after the wound stops draining.  If the dressing sticks, moisten it with soapy water and gently remove it.  Apply ointment on the wound as directed.  Avoid stretching a sutured wound.  Drink enough fluids to keep your urine clear or pale yellow.  Follow up with your caregiver for suture removal as directed.  Use sunscreen on your wound for the next 3 to 6 months so the scar will not darken. SEEK IMMEDIATE MEDICAL CARE IF:   Your wound becomes red, swollen, hot, or tender.  You have increasing pain in the wound.  You have a red streak that extends from the wound.  There is pus coming from the wound.  You have a fever.  You have shaking chills.  There is a bad smell coming from the wound.  You have persistent bleeding from the wound. MAKE SURE YOU:   Understand these instructions.  Will watch your  condition.  Will get help right away if you are not doing well or get worse. Document Released: 04/02/2004 Document Revised: 05/18/2011 Document Reviewed: 06/29/2010 [facility] Patient Information 2014 [place], [place].

## 2013-03-31 NOTE — ED Provider Notes (Signed)
Medical screening examination/treatment/procedure(s) were performed by non-physician practitioner and as supervising physician I was immediately available for consultation/collaboration.  EKG Interpretation   None         Maudry Diego, MD 03/31/13 1505

## 2013-04-05 DIAGNOSIS — S61209A Unspecified open wound of unspecified finger without damage to nail, initial encounter: Secondary | ICD-10-CM | POA: Insufficient documentation

## 2013-04-05 MED FILL — Oxycodone w/ Acetaminophen Tab 5-325 MG: ORAL | Qty: 6 | Status: AC

## 2013-07-06 DIAGNOSIS — R059 Cough, unspecified: Secondary | ICD-10-CM | POA: Insufficient documentation

## 2013-07-19 DIAGNOSIS — M797 Fibromyalgia: Secondary | ICD-10-CM | POA: Diagnosis present

## 2013-08-07 DIAGNOSIS — S6440XA Injury of digital nerve of unspecified finger, initial encounter: Secondary | ICD-10-CM | POA: Insufficient documentation

## 2013-08-07 DIAGNOSIS — G90512 Complex regional pain syndrome I of left upper limb: Secondary | ICD-10-CM | POA: Insufficient documentation

## 2014-02-19 DIAGNOSIS — R7689 Other specified abnormal immunological findings in serum: Secondary | ICD-10-CM | POA: Insufficient documentation

## 2014-02-19 DIAGNOSIS — R768 Other specified abnormal immunological findings in serum: Secondary | ICD-10-CM | POA: Insufficient documentation

## 2014-03-02 ENCOUNTER — Encounter (HOSPITAL_COMMUNITY): Payer: Self-pay | Admitting: Emergency Medicine

## 2014-03-02 ENCOUNTER — Emergency Department (HOSPITAL_COMMUNITY)
Admission: EM | Admit: 2014-03-02 | Discharge: 2014-03-02 | Disposition: A | Discharge status: Other Acute Inpatient Hospital | Payer: Medicare PPO | Attending: Emergency Medicine | Admitting: Emergency Medicine

## 2014-03-02 DIAGNOSIS — Z79899 Other long term (current) drug therapy: Secondary | ICD-10-CM | POA: Insufficient documentation

## 2014-03-02 DIAGNOSIS — Z791 Long term (current) use of non-steroidal anti-inflammatories (NSAID): Secondary | ICD-10-CM | POA: Insufficient documentation

## 2014-03-02 DIAGNOSIS — Z87448 Personal history of other diseases of urinary system: Secondary | ICD-10-CM | POA: Diagnosis not present

## 2014-03-02 DIAGNOSIS — I959 Hypotension, unspecified: Secondary | ICD-10-CM | POA: Diagnosis not present

## 2014-03-02 DIAGNOSIS — M797 Fibromyalgia: Secondary | ICD-10-CM | POA: Diagnosis not present

## 2014-03-02 DIAGNOSIS — G8929 Other chronic pain: Secondary | ICD-10-CM | POA: Diagnosis not present

## 2014-03-02 DIAGNOSIS — T814XXA Infection following a procedure, initial encounter: Secondary | ICD-10-CM | POA: Insufficient documentation

## 2014-03-02 DIAGNOSIS — J45909 Unspecified asthma, uncomplicated: Secondary | ICD-10-CM | POA: Insufficient documentation

## 2014-03-02 DIAGNOSIS — Y838 Other surgical procedures as the cause of abnormal reaction of the patient, or of later complication, without mention of misadventure at the time of the procedure: Secondary | ICD-10-CM | POA: Diagnosis not present

## 2014-03-02 DIAGNOSIS — Z7951 Long term (current) use of inhaled steroids: Secondary | ICD-10-CM | POA: Diagnosis not present

## 2014-03-02 DIAGNOSIS — T8140XA Infection following a procedure, unspecified, initial encounter: Secondary | ICD-10-CM

## 2014-03-02 DIAGNOSIS — Z793 Long term (current) use of hormonal contraceptives: Secondary | ICD-10-CM | POA: Diagnosis not present

## 2014-03-02 DIAGNOSIS — K589 Irritable bowel syndrome without diarrhea: Secondary | ICD-10-CM | POA: Diagnosis not present

## 2014-03-02 DIAGNOSIS — I10 Essential (primary) hypertension: Secondary | ICD-10-CM | POA: Diagnosis not present

## 2014-03-02 DIAGNOSIS — K219 Gastro-esophageal reflux disease without esophagitis: Secondary | ICD-10-CM | POA: Insufficient documentation

## 2014-03-02 DIAGNOSIS — G43909 Migraine, unspecified, not intractable, without status migrainosus: Secondary | ICD-10-CM | POA: Insufficient documentation

## 2014-03-02 DIAGNOSIS — Z792 Long term (current) use of antibiotics: Secondary | ICD-10-CM | POA: Insufficient documentation

## 2014-03-02 DIAGNOSIS — Z8742 Personal history of other diseases of the female genital tract: Secondary | ICD-10-CM | POA: Diagnosis not present

## 2014-03-02 DIAGNOSIS — G8918 Other acute postprocedural pain: Secondary | ICD-10-CM | POA: Diagnosis present

## 2014-03-02 DIAGNOSIS — R319 Hematuria, unspecified: Secondary | ICD-10-CM | POA: Insufficient documentation

## 2014-03-02 LAB — CBC WITH DIFFERENTIAL/PLATELET
Basophils Absolute: 0.1 10*3/uL (ref 0.0–0.1)
Basophils Relative: 1 % (ref 0–1)
EOS PCT: 4 % (ref 0–5)
Eosinophils Absolute: 0.4 10*3/uL (ref 0.0–0.7)
HEMATOCRIT: 26.8 % — AB (ref 36.0–46.0)
HEMOGLOBIN: 8.6 g/dL — AB (ref 12.0–15.0)
LYMPHS ABS: 2.1 10*3/uL (ref 0.7–4.0)
LYMPHS PCT: 24 % (ref 12–46)
MCH: 28.8 pg (ref 26.0–34.0)
MCHC: 32.1 g/dL (ref 30.0–36.0)
MCV: 89.6 fL (ref 78.0–100.0)
MONOS PCT: 7 % (ref 3–12)
Monocytes Absolute: 0.6 10*3/uL (ref 0.1–1.0)
Neutro Abs: 5.6 10*3/uL (ref 1.7–7.7)
Neutrophils Relative %: 64 % (ref 43–77)
Platelets: 357 10*3/uL (ref 150–400)
RBC: 2.99 MIL/uL — ABNORMAL LOW (ref 3.87–5.11)
RDW: 14.1 % (ref 11.5–15.5)
WBC: 8.8 10*3/uL (ref 4.0–10.5)

## 2014-03-02 LAB — URINALYSIS, ROUTINE W REFLEX MICROSCOPIC
Bilirubin Urine: NEGATIVE
Glucose, UA: NEGATIVE mg/dL
KETONES UR: NEGATIVE mg/dL
NITRITE: NEGATIVE
PH: 7 (ref 5.0–8.0)
PROTEIN: 30 mg/dL — AB
Specific Gravity, Urine: 1.02 (ref 1.005–1.030)
Urobilinogen, UA: 0.2 mg/dL (ref 0.0–1.0)

## 2014-03-02 LAB — COMPREHENSIVE METABOLIC PANEL
ALT: 14 U/L (ref 0–35)
AST: 18 U/L (ref 0–37)
Albumin: 2.6 g/dL — ABNORMAL LOW (ref 3.5–5.2)
Alkaline Phosphatase: 48 U/L (ref 39–117)
Anion gap: 5 (ref 5–15)
BUN: 11 mg/dL (ref 6–23)
CALCIUM: 8 mg/dL — AB (ref 8.4–10.5)
CO2: 26 mmol/L (ref 19–32)
Chloride: 106 mEq/L (ref 96–112)
Creatinine, Ser: 0.84 mg/dL (ref 0.50–1.10)
GFR calc non Af Amer: 81 mL/min — ABNORMAL LOW (ref >90)
GLUCOSE: 98 mg/dL (ref 70–99)
Potassium: 3.8 mmol/L (ref 3.5–5.1)
Sodium: 137 mmol/L (ref 135–145)
Total Bilirubin: 0.3 mg/dL (ref 0.3–1.2)
Total Protein: 6.2 g/dL (ref 6.0–8.3)

## 2014-03-02 LAB — URINE MICROSCOPIC-ADD ON

## 2014-03-02 LAB — LACTIC ACID, PLASMA: Lactic Acid, Venous: 1.2 mmol/L (ref 0.5–2.2)

## 2014-03-02 MED ORDER — SODIUM CHLORIDE 0.9 % IV BOLUS (SEPSIS)
1000.0000 mL | Freq: Once | INTRAVENOUS | Status: AC
  Administered 2014-03-02: 1000 mL via INTRAVENOUS

## 2014-03-02 MED ORDER — HYDROMORPHONE HCL 1 MG/ML IJ SOLN
1.0000 mg | Freq: Once | INTRAMUSCULAR | Status: AC
  Administered 2014-03-02: 1 mg via INTRAVENOUS
  Filled 2014-03-02: qty 1

## 2014-03-02 MED ORDER — HYDROMORPHONE HCL 1 MG/ML IJ SOLN
1.0000 mg | Freq: Once | INTRAMUSCULAR | Status: AC
  Administered 2014-03-02: 1 mg via INTRAVENOUS

## 2014-03-02 MED ORDER — HYDROMORPHONE HCL 1 MG/ML IJ SOLN
INTRAMUSCULAR | Status: AC
  Filled 2014-03-02: qty 1

## 2014-03-02 MED ORDER — SODIUM CHLORIDE 0.9 % IV SOLN
Freq: Once | INTRAVENOUS | Status: AC
  Administered 2014-03-02: 1000 mL via INTRAVENOUS

## 2014-03-02 NOTE — ED Provider Notes (Signed)
CSN: [id number]     Arrival date & time 03/02/14  1652 History  This chart was scribed for [employer]. Alvino Chapel, MD by Edison Simon, ED Scribe. This patient was seen in room APOTF/OTF and the patient's care was started at 5:15 PM.    Chief Complaint  Patient presents with  . Post-op Problem   The history is provided by the patient and the spouse. No language interpreter was used.    HPI Comments: Carol Thompson is a 48 y.o. female who presents to the Emergency Department complaining of fever and hypotension status post complete bladder reconstruction at [facility] 1 week ago. She was referred here today when she called [facility]. She states she has been having pelvic pain continuously, worse today. She has drain tubes and foley tube; drainage is bloody and cloudy. Husband states he flushes her suprapubic tube and it is usually gray and cloudy. She states she is using Tylenol every 4 hours which improves her fevers, and they usually run 99-100, up to 101 a few days ago. Husband states her blood pressure measured lowest at 75/40. She used medication for hypertension previously, some were stopped at [facility]. She states she is currently using Bactrim. She is using Oxycodone for pain; she states she was using pain medication before her surgery as well. She denies dizziness. She is unsure if she has dysuria.  Past Medical History  Diagnosis Date  . Cystitis, interstitial   . Fibromyalgia   . Hypertension   . Migraine   . Sleep deprivation   . Acid reflux   . Asthma   . Pelvic floor dysfunction   . Vulvodynia   . Chronic pain   . Chronic fatigue   . IBS (irritable bowel syndrome)   . Internal hemorrhoids     Colonoscopy 11/11 - Dr. Laural Golden   Past Surgical History  Procedure Laterality Date  . Bladder surgery    . Abdominal hysterectomy    . Appendectomy     Family History  Problem Relation Age of Onset  . Coronary artery disease Father   . Lung cancer Father   . Kidney failure Mother      s/p renal transplantation  . Parkinsonism Brother    History  Substance Use Topics  . Smoking status: Never Smoker   . Smokeless tobacco: Never Used  . Alcohol Use: No   OB History    No data available     Review of Systems  Constitutional: Positive for fever.  Cardiovascular:       Hypotension  Genitourinary: Positive for hematuria and pelvic pain.  Skin: Positive for wound (surgical site).  Neurological: Negative for dizziness.  All other systems reviewed and are negative.     Allergies  Ciprofloxacin  Home Medications   Prior to Admission medications   Medication Sig Start Date End Date Taking? Authorizing Provider  albuterol (VENTOLIN HFA) 108 (90 BASE) MCG/ACT inhaler Inhale 2 puffs into the lungs every 6 (six) hours as needed for wheezing or shortness of breath.    Yes Historical Provider, MD  ALPRAZolam Duanne Moron) 0.5 MG tablet Take 0.25 mg by mouth 2 (two) times daily.    Yes Historical Provider, MD  baclofen (LIORESAL) 20 MG tablet Take 10 mg by mouth 3 (three) times daily.    Yes Historical Provider, MD  beclomethasone (QVAR) 40 MCG/ACT inhaler Inhale 2 puffs into the lungs 2 (two) times daily.   Yes Historical Provider, MD  estradiol (ESTRACE) 2 MG tablet Take 2 mg  by mouth daily.     Yes Historical Provider, MD  fluticasone (FLONASE) 50 MCG/ACT nasal spray Place 1 spray into both nostrils daily. 01/18/14  Yes Historical Provider, MD  furosemide (LASIX) 20 MG tablet Take 20 mg by mouth daily as needed for fluid.    Yes Historical Provider, MD  hyoscyamine (LEVSIN, ANASPAZ) 0.125 MG tablet Take 0.125 mg by mouth every 4 (four) hours as needed for cramping.   Yes Historical Provider, MD  Oxycodone HCl 20 MG TABS Take 1 tablet by mouth 3 (three) times daily as needed (pain).  02/25/14  Yes Historical Provider, MD  promethazine (PHENERGAN) 12.5 MG tablet Take 1 tablet by mouth daily as needed for nausea or vomiting.  02/25/14  Yes Historical Provider, MD   sulfamethoxazole-trimethoprim (BACTRIM DS,SEPTRA DS) 800-160 MG per tablet Take 1 tablet by mouth 2 (two) times daily. Starting 02/25/2014 x 10 days. 02/25/14  Yes Historical Provider, MD  zolpidem (AMBIEN) 10 MG tablet Take 10 mg by mouth at bedtime as needed for sleep.    Yes Historical Provider, MD  diltiazem (CARDIZEM) 30 MG tablet Take 30 mg by mouth 3 (three) times daily.    Historical Provider, MD  heparin 5000 UNIT/ML injection Inject 8 mLs into the vein every 4 (four) hours. 03/13/13   Historical Provider, MD  lidocaine (XYLOCAINE) 2 % injection 10 mLs by Infiltration route every 4 (four) hours. 03/13/13   Historical Provider, MD  Lidocaine 0.5 % GEL Apply 1 application topically 2 (two) times daily. APPLY TO HEPARIN/SODIUM BICARB/LIDOCAINE INJECTION SITE.    Historical Provider, MD  metoprolol (TOPROL-XL) 100 MG 24 hr tablet Take 100 mg by mouth daily.      Historical Provider, MD  omeprazole (PRILOSEC) 20 MG capsule Take 20 mg by mouth daily.     Historical Provider, MD  oxyCODONE-acetaminophen (PERCOCET/ROXICET) 5-325 MG per tablet Take 1 tablet by mouth every 4 (four) hours as needed for severe pain. Patient not taking: Reported on 03/02/2014 03/28/13   Lenox Ahr, PA-C  oxyCODONE-acetaminophen (PERCOCET/ROXICET) 5-325 MG per tablet Take 1 tablet by mouth every 4 (four) hours as needed for severe pain. Patient not taking: Reported on 03/02/2014 03/28/13   Lenox Ahr, PA-C  sodium bicarbonate 1 mEq/mL injection Inject 5 mLs into the peritoneum every 4 (four) hours. 03/13/13   Historical Provider, MD  triamterene-hydrochlorothiazide (MAXZIDE-25) 37.5-25 MG per tablet Take 1 tablet by mouth daily.    Historical Provider, MD   BP 103/53 mmHg  Pulse 94  Temp(Src) 100.5 F (38.1 C) (Rectal)  Resp 16  Ht 5\' 1"  (1.549 m)  Wt 142 lb (64.411 kg)  BMI 26.84 kg/m2  SpO2 95% Physical Exam  Constitutional: She is oriented to person, place, and time. She appears well-developed and  well-nourished.  Awake, appropriate  HENT:  Head: Normocephalic and atraumatic.  Eyes: Conjunctivae are normal.  Neck: Normal range of motion. Neck supple.  Cardiovascular: Normal rate, regular rhythm and normal heart sounds.   No murmur heard. Pulmonary/Chest: Effort normal and breath sounds normal. No respiratory distress. She has no wheezes. She has no rales.  Abdominal: Soft. There is tenderness.  Lower abdominal tenderness, worst in RLQ Stapled lower abdominal horizontal surgical site JP drain in RLQ, suprapubic tube in LLQ  Genitourinary:   Foley catheter in place  Musculoskeletal: Normal range of motion. She exhibits no edema.  No peripheral edema  Neurological: She is alert and oriented to person, place, and time.  Skin: Skin is warm and  dry.  Mildly warm but not febrile  Psychiatric: She has a normal mood and affect.  Nursing note and vitals reviewed.   ED Course  Procedures (including critical care time)  COORDINATION OF CARE: 5:23 PM Discussed treatment plan with patient at beside, the patient agrees with the plan and has no further questions at this time.   Labs Review Labs Reviewed  CBC WITH DIFFERENTIAL - Abnormal; Notable for the following:    RBC 2.99 (*)    Hemoglobin 8.6 (*)    HCT 26.8 (*)    All other components within normal limits  COMPREHENSIVE METABOLIC PANEL - Abnormal; Notable for the following:    Calcium 8.0 (*)    Albumin 2.6 (*)    GFR calc non Af Amer 81 (*)    All other components within normal limits  URINALYSIS, ROUTINE W REFLEX MICROSCOPIC - Abnormal; Notable for the following:    APPearance CLOUDY (*)    Hgb urine dipstick LARGE (*)    Protein, ur 30 (*)    Leukocytes, UA LARGE (*)    All other components within normal limits  URINE MICROSCOPIC-ADD ON - Abnormal; Notable for the following:    Bacteria, UA MANY (*)    All other components within normal limits  CULTURE, BLOOD (ROUTINE X 2)  CULTURE, BLOOD (ROUTINE X 2)  URINE  CULTURE  LACTIC ACID, PLASMA    Imaging Review No results found.   EKG Interpretation None      MDM   Final diagnoses:  Post-operative infection    Patient with fever and lower abdominal pain after complete bladder reconstruction. Has been somewhat hypotensive at home and now here. Lactic acid is normal. Temperature 100.5. Discuss with Dr. Delene Loll at [facility] and patient accepted in transfer. She requests no antibiotics this time.  I personally performed the services described in this documentation, which was scribed in my presence. The recorded information has been reviewed and is accurate.     Jasper Riling. Alvino Chapel, MD 03/02/14 [phone]

## 2014-03-02 NOTE — ED Notes (Addendum)
Pt had complete bladder reconstruction 1wk ago at [facility]. Pt has drain tube and foley tubes. Pt has been experiencing fevers and hypotension. Husband called doctor at [facility] who advised them to come here. Pt has stapled incision from hip to hip in lower abdomen. Pt is currently in pain but A&O. BP 118/81. HR 98.

## 2014-03-05 LAB — URINE CULTURE: Colony Count: 70000

## 2014-03-07 LAB — CULTURE, BLOOD (ROUTINE X 2)
Culture: NO GROWTH
Culture: NO GROWTH

## 2014-04-26 DIAGNOSIS — J340 Abscess, furuncle and carbuncle of nose: Secondary | ICD-10-CM | POA: Insufficient documentation

## 2014-05-24 DIAGNOSIS — J111 Influenza due to unidentified influenza virus with other respiratory manifestations: Secondary | ICD-10-CM | POA: Insufficient documentation

## 2014-07-19 DIAGNOSIS — I10 Essential (primary) hypertension: Secondary | ICD-10-CM | POA: Diagnosis not present

## 2014-07-19 DIAGNOSIS — N3011 Interstitial cystitis (chronic) with hematuria: Secondary | ICD-10-CM | POA: Diagnosis not present

## 2014-07-19 DIAGNOSIS — Z1389 Encounter for screening for other disorder: Secondary | ICD-10-CM | POA: Diagnosis not present

## 2014-07-19 DIAGNOSIS — G9050 Complex regional pain syndrome I, unspecified: Secondary | ICD-10-CM | POA: Diagnosis not present

## 2014-07-31 DIAGNOSIS — I10 Essential (primary) hypertension: Secondary | ICD-10-CM | POA: Diagnosis not present

## 2014-07-31 DIAGNOSIS — N3010 Interstitial cystitis (chronic) without hematuria: Secondary | ICD-10-CM | POA: Diagnosis not present

## 2014-07-31 DIAGNOSIS — J45909 Unspecified asthma, uncomplicated: Secondary | ICD-10-CM | POA: Diagnosis not present

## 2014-07-31 DIAGNOSIS — G90512 Complex regional pain syndrome I of left upper limb: Secondary | ICD-10-CM | POA: Diagnosis not present

## 2014-07-31 DIAGNOSIS — R350 Frequency of micturition: Secondary | ICD-10-CM | POA: Diagnosis not present

## 2014-07-31 DIAGNOSIS — M24542 Contracture, left hand: Secondary | ICD-10-CM | POA: Diagnosis not present

## 2014-07-31 DIAGNOSIS — K219 Gastro-esophageal reflux disease without esophagitis: Secondary | ICD-10-CM | POA: Diagnosis not present

## 2014-07-31 DIAGNOSIS — G8918 Other acute postprocedural pain: Secondary | ICD-10-CM | POA: Diagnosis not present

## 2014-07-31 DIAGNOSIS — S6440XA Injury of digital nerve of unspecified finger, initial encounter: Secondary | ICD-10-CM | POA: Diagnosis not present

## 2014-07-31 DIAGNOSIS — R000 Tachycardia, unspecified: Secondary | ICD-10-CM | POA: Diagnosis not present

## 2014-07-31 DIAGNOSIS — S6992XA Unspecified injury of left wrist, hand and finger(s), initial encounter: Secondary | ICD-10-CM | POA: Insufficient documentation

## 2014-07-31 DIAGNOSIS — M797 Fibromyalgia: Secondary | ICD-10-CM | POA: Diagnosis not present

## 2014-07-31 DIAGNOSIS — S64493A Injury of digital nerve of left middle finger, initial encounter: Secondary | ICD-10-CM | POA: Diagnosis not present

## 2014-08-01 DIAGNOSIS — K219 Gastro-esophageal reflux disease without esophagitis: Secondary | ICD-10-CM | POA: Diagnosis not present

## 2014-08-01 DIAGNOSIS — J45909 Unspecified asthma, uncomplicated: Secondary | ICD-10-CM | POA: Diagnosis not present

## 2014-08-01 DIAGNOSIS — R350 Frequency of micturition: Secondary | ICD-10-CM | POA: Diagnosis not present

## 2014-08-01 DIAGNOSIS — I10 Essential (primary) hypertension: Secondary | ICD-10-CM | POA: Diagnosis not present

## 2014-08-01 DIAGNOSIS — M797 Fibromyalgia: Secondary | ICD-10-CM | POA: Diagnosis not present

## 2014-08-01 DIAGNOSIS — N3010 Interstitial cystitis (chronic) without hematuria: Secondary | ICD-10-CM | POA: Diagnosis not present

## 2014-08-01 DIAGNOSIS — G90512 Complex regional pain syndrome I of left upper limb: Secondary | ICD-10-CM | POA: Diagnosis not present

## 2014-08-01 DIAGNOSIS — S6440XA Injury of digital nerve of unspecified finger, initial encounter: Secondary | ICD-10-CM | POA: Diagnosis not present

## 2014-08-01 DIAGNOSIS — R000 Tachycardia, unspecified: Secondary | ICD-10-CM | POA: Diagnosis not present

## 2014-08-21 DIAGNOSIS — M25642 Stiffness of left hand, not elsewhere classified: Secondary | ICD-10-CM | POA: Diagnosis not present

## 2014-08-21 DIAGNOSIS — M79642 Pain in left hand: Secondary | ICD-10-CM | POA: Diagnosis not present

## 2014-08-21 DIAGNOSIS — M62542 Muscle wasting and atrophy, not elsewhere classified, left hand: Secondary | ICD-10-CM | POA: Diagnosis not present

## 2014-08-21 DIAGNOSIS — M25442 Effusion, left hand: Secondary | ICD-10-CM | POA: Diagnosis not present

## 2014-08-22 DIAGNOSIS — R339 Retention of urine, unspecified: Secondary | ICD-10-CM | POA: Diagnosis not present

## 2014-08-27 DIAGNOSIS — M25642 Stiffness of left hand, not elsewhere classified: Secondary | ICD-10-CM | POA: Diagnosis not present

## 2014-08-27 DIAGNOSIS — M62542 Muscle wasting and atrophy, not elsewhere classified, left hand: Secondary | ICD-10-CM | POA: Diagnosis not present

## 2014-08-27 DIAGNOSIS — M79642 Pain in left hand: Secondary | ICD-10-CM | POA: Diagnosis not present

## 2014-08-27 DIAGNOSIS — M25442 Effusion, left hand: Secondary | ICD-10-CM | POA: Diagnosis not present

## 2014-08-29 DIAGNOSIS — M25442 Effusion, left hand: Secondary | ICD-10-CM | POA: Diagnosis not present

## 2014-08-29 DIAGNOSIS — M79642 Pain in left hand: Secondary | ICD-10-CM | POA: Diagnosis not present

## 2014-08-29 DIAGNOSIS — M25642 Stiffness of left hand, not elsewhere classified: Secondary | ICD-10-CM | POA: Diagnosis not present

## 2014-08-29 DIAGNOSIS — M62542 Muscle wasting and atrophy, not elsewhere classified, left hand: Secondary | ICD-10-CM | POA: Diagnosis not present

## 2014-08-30 DIAGNOSIS — M62542 Muscle wasting and atrophy, not elsewhere classified, left hand: Secondary | ICD-10-CM | POA: Diagnosis not present

## 2014-08-30 DIAGNOSIS — M79642 Pain in left hand: Secondary | ICD-10-CM | POA: Diagnosis not present

## 2014-08-30 DIAGNOSIS — M25642 Stiffness of left hand, not elsewhere classified: Secondary | ICD-10-CM | POA: Diagnosis not present

## 2014-08-30 DIAGNOSIS — M25442 Effusion, left hand: Secondary | ICD-10-CM | POA: Diagnosis not present

## 2014-09-04 DIAGNOSIS — M25642 Stiffness of left hand, not elsewhere classified: Secondary | ICD-10-CM | POA: Diagnosis not present

## 2014-09-04 DIAGNOSIS — M25442 Effusion, left hand: Secondary | ICD-10-CM | POA: Diagnosis not present

## 2014-09-04 DIAGNOSIS — M62542 Muscle wasting and atrophy, not elsewhere classified, left hand: Secondary | ICD-10-CM | POA: Diagnosis not present

## 2014-09-04 DIAGNOSIS — M79642 Pain in left hand: Secondary | ICD-10-CM | POA: Diagnosis not present

## 2014-09-05 DIAGNOSIS — M62542 Muscle wasting and atrophy, not elsewhere classified, left hand: Secondary | ICD-10-CM | POA: Diagnosis not present

## 2014-09-05 DIAGNOSIS — M25442 Effusion, left hand: Secondary | ICD-10-CM | POA: Diagnosis not present

## 2014-09-05 DIAGNOSIS — M25642 Stiffness of left hand, not elsewhere classified: Secondary | ICD-10-CM | POA: Diagnosis not present

## 2014-09-05 DIAGNOSIS — M79642 Pain in left hand: Secondary | ICD-10-CM | POA: Diagnosis not present

## 2014-09-11 DIAGNOSIS — M25642 Stiffness of left hand, not elsewhere classified: Secondary | ICD-10-CM | POA: Diagnosis not present

## 2014-09-11 DIAGNOSIS — M79642 Pain in left hand: Secondary | ICD-10-CM | POA: Diagnosis not present

## 2014-09-11 DIAGNOSIS — M62542 Muscle wasting and atrophy, not elsewhere classified, left hand: Secondary | ICD-10-CM | POA: Diagnosis not present

## 2014-09-11 DIAGNOSIS — M25442 Effusion, left hand: Secondary | ICD-10-CM | POA: Diagnosis not present

## 2014-09-12 DIAGNOSIS — G90512 Complex regional pain syndrome I of left upper limb: Secondary | ICD-10-CM | POA: Diagnosis not present

## 2014-09-12 DIAGNOSIS — J45909 Unspecified asthma, uncomplicated: Secondary | ICD-10-CM | POA: Diagnosis not present

## 2014-09-12 DIAGNOSIS — S64497D Injury of digital nerve of left little finger, subsequent encounter: Secondary | ICD-10-CM | POA: Diagnosis not present

## 2014-09-12 DIAGNOSIS — I10 Essential (primary) hypertension: Secondary | ICD-10-CM | POA: Diagnosis not present

## 2014-09-12 DIAGNOSIS — M797 Fibromyalgia: Secondary | ICD-10-CM | POA: Diagnosis not present

## 2014-09-13 DIAGNOSIS — M25642 Stiffness of left hand, not elsewhere classified: Secondary | ICD-10-CM | POA: Diagnosis not present

## 2014-09-13 DIAGNOSIS — M25442 Effusion, left hand: Secondary | ICD-10-CM | POA: Diagnosis not present

## 2014-09-13 DIAGNOSIS — M79642 Pain in left hand: Secondary | ICD-10-CM | POA: Diagnosis not present

## 2014-09-13 DIAGNOSIS — M62542 Muscle wasting and atrophy, not elsewhere classified, left hand: Secondary | ICD-10-CM | POA: Diagnosis not present

## 2014-09-18 DIAGNOSIS — G90512 Complex regional pain syndrome I of left upper limb: Secondary | ICD-10-CM | POA: Diagnosis not present

## 2014-09-18 DIAGNOSIS — I10 Essential (primary) hypertension: Secondary | ICD-10-CM | POA: Diagnosis not present

## 2014-09-18 DIAGNOSIS — D649 Anemia, unspecified: Secondary | ICD-10-CM | POA: Diagnosis not present

## 2014-09-18 DIAGNOSIS — M25442 Effusion, left hand: Secondary | ICD-10-CM | POA: Diagnosis not present

## 2014-09-18 DIAGNOSIS — M79642 Pain in left hand: Secondary | ICD-10-CM | POA: Diagnosis not present

## 2014-09-18 DIAGNOSIS — M25642 Stiffness of left hand, not elsewhere classified: Secondary | ICD-10-CM | POA: Diagnosis not present

## 2014-09-18 DIAGNOSIS — M62542 Muscle wasting and atrophy, not elsewhere classified, left hand: Secondary | ICD-10-CM | POA: Diagnosis not present

## 2014-09-18 DIAGNOSIS — M797 Fibromyalgia: Secondary | ICD-10-CM | POA: Diagnosis not present

## 2014-09-20 DIAGNOSIS — M25442 Effusion, left hand: Secondary | ICD-10-CM | POA: Diagnosis not present

## 2014-09-20 DIAGNOSIS — M25642 Stiffness of left hand, not elsewhere classified: Secondary | ICD-10-CM | POA: Diagnosis not present

## 2014-09-20 DIAGNOSIS — M79642 Pain in left hand: Secondary | ICD-10-CM | POA: Diagnosis not present

## 2014-09-20 DIAGNOSIS — M62542 Muscle wasting and atrophy, not elsewhere classified, left hand: Secondary | ICD-10-CM | POA: Diagnosis not present

## 2014-09-21 DIAGNOSIS — R339 Retention of urine, unspecified: Secondary | ICD-10-CM | POA: Diagnosis not present

## 2014-09-27 DIAGNOSIS — M79642 Pain in left hand: Secondary | ICD-10-CM | POA: Diagnosis not present

## 2014-09-27 DIAGNOSIS — M25442 Effusion, left hand: Secondary | ICD-10-CM | POA: Diagnosis not present

## 2014-09-27 DIAGNOSIS — M25642 Stiffness of left hand, not elsewhere classified: Secondary | ICD-10-CM | POA: Diagnosis not present

## 2014-09-27 DIAGNOSIS — M62542 Muscle wasting and atrophy, not elsewhere classified, left hand: Secondary | ICD-10-CM | POA: Diagnosis not present

## 2014-10-02 DIAGNOSIS — M25442 Effusion, left hand: Secondary | ICD-10-CM | POA: Diagnosis not present

## 2014-10-02 DIAGNOSIS — M62542 Muscle wasting and atrophy, not elsewhere classified, left hand: Secondary | ICD-10-CM | POA: Diagnosis not present

## 2014-10-02 DIAGNOSIS — M79642 Pain in left hand: Secondary | ICD-10-CM | POA: Diagnosis not present

## 2014-10-02 DIAGNOSIS — M25642 Stiffness of left hand, not elsewhere classified: Secondary | ICD-10-CM | POA: Diagnosis not present

## 2014-10-03 DIAGNOSIS — M25442 Effusion, left hand: Secondary | ICD-10-CM | POA: Diagnosis not present

## 2014-10-03 DIAGNOSIS — M79642 Pain in left hand: Secondary | ICD-10-CM | POA: Diagnosis not present

## 2014-10-03 DIAGNOSIS — M62542 Muscle wasting and atrophy, not elsewhere classified, left hand: Secondary | ICD-10-CM | POA: Diagnosis not present

## 2014-10-03 DIAGNOSIS — M25642 Stiffness of left hand, not elsewhere classified: Secondary | ICD-10-CM | POA: Diagnosis not present

## 2014-10-11 DIAGNOSIS — M79642 Pain in left hand: Secondary | ICD-10-CM | POA: Diagnosis not present

## 2014-10-11 DIAGNOSIS — M25642 Stiffness of left hand, not elsewhere classified: Secondary | ICD-10-CM | POA: Diagnosis not present

## 2014-10-11 DIAGNOSIS — M62542 Muscle wasting and atrophy, not elsewhere classified, left hand: Secondary | ICD-10-CM | POA: Diagnosis not present

## 2014-10-11 DIAGNOSIS — M25442 Effusion, left hand: Secondary | ICD-10-CM | POA: Diagnosis not present

## 2014-10-16 DIAGNOSIS — R339 Retention of urine, unspecified: Secondary | ICD-10-CM | POA: Diagnosis not present

## 2014-10-16 DIAGNOSIS — M79642 Pain in left hand: Secondary | ICD-10-CM | POA: Diagnosis not present

## 2014-10-16 DIAGNOSIS — M25442 Effusion, left hand: Secondary | ICD-10-CM | POA: Diagnosis not present

## 2014-10-16 DIAGNOSIS — M25642 Stiffness of left hand, not elsewhere classified: Secondary | ICD-10-CM | POA: Diagnosis not present

## 2014-10-16 DIAGNOSIS — M62542 Muscle wasting and atrophy, not elsewhere classified, left hand: Secondary | ICD-10-CM | POA: Diagnosis not present

## 2014-10-17 DIAGNOSIS — M79642 Pain in left hand: Secondary | ICD-10-CM | POA: Diagnosis not present

## 2014-10-17 DIAGNOSIS — M25442 Effusion, left hand: Secondary | ICD-10-CM | POA: Diagnosis not present

## 2014-10-17 DIAGNOSIS — M62542 Muscle wasting and atrophy, not elsewhere classified, left hand: Secondary | ICD-10-CM | POA: Diagnosis not present

## 2014-10-17 DIAGNOSIS — M25642 Stiffness of left hand, not elsewhere classified: Secondary | ICD-10-CM | POA: Diagnosis not present

## 2014-10-24 DIAGNOSIS — G90512 Complex regional pain syndrome I of left upper limb: Secondary | ICD-10-CM | POA: Diagnosis not present

## 2014-10-24 DIAGNOSIS — S64493A Injury of digital nerve of left middle finger, initial encounter: Secondary | ICD-10-CM | POA: Diagnosis not present

## 2014-10-30 DIAGNOSIS — G90512 Complex regional pain syndrome I of left upper limb: Secondary | ICD-10-CM | POA: Diagnosis not present

## 2014-10-30 DIAGNOSIS — M797 Fibromyalgia: Secondary | ICD-10-CM | POA: Diagnosis not present

## 2014-10-30 DIAGNOSIS — I10 Essential (primary) hypertension: Secondary | ICD-10-CM | POA: Diagnosis not present

## 2014-10-30 DIAGNOSIS — N3011 Interstitial cystitis (chronic) with hematuria: Secondary | ICD-10-CM | POA: Diagnosis not present

## 2014-10-30 DIAGNOSIS — R000 Tachycardia, unspecified: Secondary | ICD-10-CM | POA: Diagnosis not present

## 2014-11-03 DIAGNOSIS — N12 Tubulo-interstitial nephritis, not specified as acute or chronic: Secondary | ICD-10-CM | POA: Diagnosis not present

## 2014-11-03 DIAGNOSIS — Z9889 Other specified postprocedural states: Secondary | ICD-10-CM | POA: Diagnosis not present

## 2014-11-03 DIAGNOSIS — Z8541 Personal history of malignant neoplasm of cervix uteri: Secondary | ICD-10-CM | POA: Diagnosis not present

## 2014-11-03 DIAGNOSIS — R509 Fever, unspecified: Secondary | ICD-10-CM | POA: Diagnosis not present

## 2014-11-03 DIAGNOSIS — N390 Urinary tract infection, site not specified: Secondary | ICD-10-CM | POA: Diagnosis not present

## 2014-11-16 DIAGNOSIS — R339 Retention of urine, unspecified: Secondary | ICD-10-CM | POA: Diagnosis not present

## 2014-12-11 DIAGNOSIS — R339 Retention of urine, unspecified: Secondary | ICD-10-CM | POA: Diagnosis not present

## 2015-01-02 DIAGNOSIS — D62 Acute posthemorrhagic anemia: Secondary | ICD-10-CM | POA: Diagnosis not present

## 2015-01-04 DIAGNOSIS — R339 Retention of urine, unspecified: Secondary | ICD-10-CM | POA: Diagnosis not present

## 2015-01-08 DIAGNOSIS — R8290 Unspecified abnormal findings in urine: Secondary | ICD-10-CM | POA: Insufficient documentation

## 2015-01-08 DIAGNOSIS — N9489 Other specified conditions associated with female genital organs and menstrual cycle: Secondary | ICD-10-CM | POA: Diagnosis not present

## 2015-01-08 DIAGNOSIS — K580 Irritable bowel syndrome with diarrhea: Secondary | ICD-10-CM | POA: Diagnosis not present

## 2015-01-08 DIAGNOSIS — M6289 Other specified disorders of muscle: Secondary | ICD-10-CM | POA: Insufficient documentation

## 2015-01-08 DIAGNOSIS — M797 Fibromyalgia: Secondary | ICD-10-CM | POA: Diagnosis not present

## 2015-01-08 DIAGNOSIS — Z906 Acquired absence of other parts of urinary tract: Secondary | ICD-10-CM | POA: Insufficient documentation

## 2015-01-08 DIAGNOSIS — Z9889 Other specified postprocedural states: Secondary | ICD-10-CM | POA: Diagnosis not present

## 2015-01-09 DIAGNOSIS — I10 Essential (primary) hypertension: Secondary | ICD-10-CM | POA: Diagnosis not present

## 2015-01-09 DIAGNOSIS — Z23 Encounter for immunization: Secondary | ICD-10-CM | POA: Diagnosis not present

## 2015-01-09 DIAGNOSIS — G90512 Complex regional pain syndrome I of left upper limb: Secondary | ICD-10-CM | POA: Diagnosis not present

## 2015-01-09 DIAGNOSIS — R000 Tachycardia, unspecified: Secondary | ICD-10-CM | POA: Diagnosis not present

## 2015-01-09 DIAGNOSIS — N3011 Interstitial cystitis (chronic) with hematuria: Secondary | ICD-10-CM | POA: Diagnosis not present

## 2015-01-09 DIAGNOSIS — R002 Palpitations: Secondary | ICD-10-CM | POA: Diagnosis not present

## 2015-01-09 DIAGNOSIS — M797 Fibromyalgia: Secondary | ICD-10-CM | POA: Diagnosis not present

## 2015-01-16 DIAGNOSIS — Z8739 Personal history of other diseases of the musculoskeletal system and connective tissue: Secondary | ICD-10-CM | POA: Diagnosis not present

## 2015-01-16 DIAGNOSIS — Z9049 Acquired absence of other specified parts of digestive tract: Secondary | ICD-10-CM | POA: Insufficient documentation

## 2015-01-16 DIAGNOSIS — Z62810 Personal history of physical and sexual abuse in childhood: Secondary | ICD-10-CM | POA: Diagnosis not present

## 2015-01-16 DIAGNOSIS — Z8541 Personal history of malignant neoplasm of cervix uteri: Secondary | ICD-10-CM | POA: Insufficient documentation

## 2015-01-16 DIAGNOSIS — D649 Anemia, unspecified: Secondary | ICD-10-CM | POA: Diagnosis not present

## 2015-01-16 DIAGNOSIS — N3010 Interstitial cystitis (chronic) without hematuria: Secondary | ICD-10-CM | POA: Diagnosis not present

## 2015-01-16 DIAGNOSIS — Z91410 Personal history of adult physical and sexual abuse: Secondary | ICD-10-CM | POA: Insufficient documentation

## 2015-01-22 DIAGNOSIS — D649 Anemia, unspecified: Secondary | ICD-10-CM | POA: Diagnosis not present

## 2015-01-28 DIAGNOSIS — Z9889 Other specified postprocedural states: Secondary | ICD-10-CM | POA: Diagnosis not present

## 2015-01-28 DIAGNOSIS — G90512 Complex regional pain syndrome I of left upper limb: Secondary | ICD-10-CM | POA: Diagnosis not present

## 2015-01-28 DIAGNOSIS — J45909 Unspecified asthma, uncomplicated: Secondary | ICD-10-CM | POA: Diagnosis not present

## 2015-01-28 DIAGNOSIS — S6440XA Injury of digital nerve of unspecified finger, initial encounter: Secondary | ICD-10-CM | POA: Diagnosis not present

## 2015-01-28 DIAGNOSIS — Z881 Allergy status to other antibiotic agents status: Secondary | ICD-10-CM | POA: Diagnosis not present

## 2015-01-28 DIAGNOSIS — Z79899 Other long term (current) drug therapy: Secondary | ICD-10-CM | POA: Diagnosis not present

## 2015-01-28 DIAGNOSIS — Z7951 Long term (current) use of inhaled steroids: Secondary | ICD-10-CM | POA: Diagnosis not present

## 2015-01-28 DIAGNOSIS — I10 Essential (primary) hypertension: Secondary | ICD-10-CM | POA: Diagnosis not present

## 2015-01-29 DIAGNOSIS — D649 Anemia, unspecified: Secondary | ICD-10-CM | POA: Diagnosis not present

## 2015-02-05 DIAGNOSIS — R339 Retention of urine, unspecified: Secondary | ICD-10-CM | POA: Diagnosis not present

## 2015-03-01 DIAGNOSIS — R339 Retention of urine, unspecified: Secondary | ICD-10-CM | POA: Diagnosis not present

## 2015-03-13 DIAGNOSIS — L509 Urticaria, unspecified: Secondary | ICD-10-CM | POA: Insufficient documentation

## 2015-03-13 DIAGNOSIS — D509 Iron deficiency anemia, unspecified: Secondary | ICD-10-CM | POA: Diagnosis not present

## 2015-03-13 DIAGNOSIS — D649 Anemia, unspecified: Secondary | ICD-10-CM | POA: Diagnosis not present

## 2015-03-29 DIAGNOSIS — R339 Retention of urine, unspecified: Secondary | ICD-10-CM | POA: Diagnosis not present

## 2015-04-06 DIAGNOSIS — R350 Frequency of micturition: Secondary | ICD-10-CM | POA: Diagnosis not present

## 2015-04-06 DIAGNOSIS — N390 Urinary tract infection, site not specified: Secondary | ICD-10-CM | POA: Diagnosis not present

## 2015-04-08 DIAGNOSIS — M797 Fibromyalgia: Secondary | ICD-10-CM | POA: Diagnosis not present

## 2015-04-08 DIAGNOSIS — R000 Tachycardia, unspecified: Secondary | ICD-10-CM | POA: Diagnosis not present

## 2015-04-08 DIAGNOSIS — G90512 Complex regional pain syndrome I of left upper limb: Secondary | ICD-10-CM | POA: Diagnosis not present

## 2015-04-08 DIAGNOSIS — I10 Essential (primary) hypertension: Secondary | ICD-10-CM | POA: Diagnosis not present

## 2015-04-08 DIAGNOSIS — N3011 Interstitial cystitis (chronic) with hematuria: Secondary | ICD-10-CM | POA: Diagnosis not present

## 2015-04-15 DIAGNOSIS — M797 Fibromyalgia: Secondary | ICD-10-CM | POA: Diagnosis not present

## 2015-04-15 DIAGNOSIS — R000 Tachycardia, unspecified: Secondary | ICD-10-CM | POA: Diagnosis not present

## 2015-04-15 DIAGNOSIS — N3011 Interstitial cystitis (chronic) with hematuria: Secondary | ICD-10-CM | POA: Diagnosis not present

## 2015-04-15 DIAGNOSIS — I10 Essential (primary) hypertension: Secondary | ICD-10-CM | POA: Diagnosis not present

## 2015-04-15 DIAGNOSIS — G9050 Complex regional pain syndrome I, unspecified: Secondary | ICD-10-CM | POA: Diagnosis not present

## 2015-04-16 DIAGNOSIS — N9489 Other specified conditions associated with female genital organs and menstrual cycle: Secondary | ICD-10-CM | POA: Diagnosis not present

## 2015-04-16 DIAGNOSIS — Z9889 Other specified postprocedural states: Secondary | ICD-10-CM | POA: Diagnosis not present

## 2015-04-16 DIAGNOSIS — M797 Fibromyalgia: Secondary | ICD-10-CM | POA: Diagnosis not present

## 2015-04-16 DIAGNOSIS — Z09 Encounter for follow-up examination after completed treatment for conditions other than malignant neoplasm: Secondary | ICD-10-CM | POA: Insufficient documentation

## 2015-04-16 DIAGNOSIS — R197 Diarrhea, unspecified: Secondary | ICD-10-CM | POA: Diagnosis not present

## 2015-04-18 DIAGNOSIS — R079 Chest pain, unspecified: Secondary | ICD-10-CM | POA: Diagnosis not present

## 2015-04-18 DIAGNOSIS — F419 Anxiety disorder, unspecified: Secondary | ICD-10-CM | POA: Diagnosis not present

## 2015-04-18 DIAGNOSIS — I10 Essential (primary) hypertension: Secondary | ICD-10-CM | POA: Diagnosis not present

## 2015-04-18 DIAGNOSIS — G894 Chronic pain syndrome: Secondary | ICD-10-CM | POA: Diagnosis not present

## 2015-04-18 DIAGNOSIS — R0602 Shortness of breath: Secondary | ICD-10-CM | POA: Diagnosis not present

## 2015-04-18 DIAGNOSIS — R531 Weakness: Secondary | ICD-10-CM | POA: Diagnosis not present

## 2015-04-19 DIAGNOSIS — R079 Chest pain, unspecified: Secondary | ICD-10-CM | POA: Diagnosis not present

## 2015-04-30 DIAGNOSIS — K580 Irritable bowel syndrome with diarrhea: Secondary | ICD-10-CM | POA: Diagnosis not present

## 2015-04-30 DIAGNOSIS — R319 Hematuria, unspecified: Secondary | ICD-10-CM | POA: Diagnosis not present

## 2015-04-30 DIAGNOSIS — Z9889 Other specified postprocedural states: Secondary | ICD-10-CM | POA: Diagnosis not present

## 2015-04-30 DIAGNOSIS — N9489 Other specified conditions associated with female genital organs and menstrual cycle: Secondary | ICD-10-CM | POA: Diagnosis not present

## 2015-04-30 DIAGNOSIS — N3010 Interstitial cystitis (chronic) without hematuria: Secondary | ICD-10-CM | POA: Diagnosis not present

## 2015-04-30 DIAGNOSIS — M797 Fibromyalgia: Secondary | ICD-10-CM | POA: Diagnosis not present

## 2015-05-14 DIAGNOSIS — D649 Anemia, unspecified: Secondary | ICD-10-CM | POA: Diagnosis not present

## 2015-05-14 DIAGNOSIS — D509 Iron deficiency anemia, unspecified: Secondary | ICD-10-CM | POA: Diagnosis not present

## 2015-05-17 DIAGNOSIS — R339 Retention of urine, unspecified: Secondary | ICD-10-CM | POA: Diagnosis not present

## 2015-05-28 DIAGNOSIS — N644 Mastodynia: Secondary | ICD-10-CM | POA: Diagnosis not present

## 2015-06-05 DIAGNOSIS — N644 Mastodynia: Secondary | ICD-10-CM | POA: Diagnosis not present

## 2015-06-05 DIAGNOSIS — N63 Unspecified lump in breast: Secondary | ICD-10-CM | POA: Diagnosis not present

## 2015-06-13 DIAGNOSIS — R339 Retention of urine, unspecified: Secondary | ICD-10-CM | POA: Diagnosis not present

## 2015-07-09 DIAGNOSIS — G9050 Complex regional pain syndrome I, unspecified: Secondary | ICD-10-CM | POA: Diagnosis not present

## 2015-07-09 DIAGNOSIS — R002 Palpitations: Secondary | ICD-10-CM | POA: Diagnosis not present

## 2015-07-09 DIAGNOSIS — I10 Essential (primary) hypertension: Secondary | ICD-10-CM | POA: Diagnosis not present

## 2015-07-09 DIAGNOSIS — M797 Fibromyalgia: Secondary | ICD-10-CM | POA: Diagnosis not present

## 2015-07-09 DIAGNOSIS — R000 Tachycardia, unspecified: Secondary | ICD-10-CM | POA: Diagnosis not present

## 2015-07-09 DIAGNOSIS — N3011 Interstitial cystitis (chronic) with hematuria: Secondary | ICD-10-CM | POA: Diagnosis not present

## 2015-07-09 DIAGNOSIS — R339 Retention of urine, unspecified: Secondary | ICD-10-CM | POA: Diagnosis not present

## 2015-07-09 DIAGNOSIS — N390 Urinary tract infection, site not specified: Secondary | ICD-10-CM | POA: Diagnosis not present

## 2015-08-06 DIAGNOSIS — R339 Retention of urine, unspecified: Secondary | ICD-10-CM | POA: Diagnosis not present

## 2015-09-03 ENCOUNTER — Encounter (HOSPITAL_COMMUNITY): Payer: Self-pay | Admitting: Emergency Medicine

## 2015-09-03 ENCOUNTER — Emergency Department (HOSPITAL_COMMUNITY): Payer: Medicare PPO

## 2015-09-03 ENCOUNTER — Inpatient Hospital Stay (HOSPITAL_COMMUNITY)
Admission: EM | Admit: 2015-09-03 | Discharge: 2015-09-07 | DRG: 389 | Disposition: A | Discharge status: Home / Self Care | Payer: Medicare PPO | Attending: Internal Medicine | Admitting: Internal Medicine

## 2015-09-03 DIAGNOSIS — J45909 Unspecified asthma, uncomplicated: Secondary | ICD-10-CM | POA: Diagnosis present

## 2015-09-03 DIAGNOSIS — R1013 Epigastric pain: Secondary | ICD-10-CM | POA: Diagnosis present

## 2015-09-03 DIAGNOSIS — Z801 Family history of malignant neoplasm of trachea, bronchus and lung: Secondary | ICD-10-CM | POA: Diagnosis not present

## 2015-09-03 DIAGNOSIS — K56609 Unspecified intestinal obstruction, unspecified as to partial versus complete obstruction: Secondary | ICD-10-CM

## 2015-09-03 DIAGNOSIS — K589 Irritable bowel syndrome without diarrhea: Secondary | ICD-10-CM | POA: Diagnosis present

## 2015-09-03 DIAGNOSIS — Z9049 Acquired absence of other specified parts of digestive tract: Secondary | ICD-10-CM

## 2015-09-03 DIAGNOSIS — K5669 Other intestinal obstruction: Secondary | ICD-10-CM | POA: Diagnosis not present

## 2015-09-03 DIAGNOSIS — K56600 Partial intestinal obstruction, unspecified as to cause: Secondary | ICD-10-CM | POA: Diagnosis present

## 2015-09-03 DIAGNOSIS — K219 Gastro-esophageal reflux disease without esophagitis: Secondary | ICD-10-CM | POA: Diagnosis not present

## 2015-09-03 DIAGNOSIS — N3000 Acute cystitis without hematuria: Secondary | ICD-10-CM | POA: Diagnosis not present

## 2015-09-03 DIAGNOSIS — Z841 Family history of disorders of kidney and ureter: Secondary | ICD-10-CM

## 2015-09-03 DIAGNOSIS — M797 Fibromyalgia: Secondary | ICD-10-CM | POA: Diagnosis present

## 2015-09-03 DIAGNOSIS — B9620 Unspecified Escherichia coli [E. coli] as the cause of diseases classified elsewhere: Secondary | ICD-10-CM | POA: Diagnosis present

## 2015-09-03 DIAGNOSIS — N390 Urinary tract infection, site not specified: Secondary | ICD-10-CM | POA: Diagnosis present

## 2015-09-03 DIAGNOSIS — I10 Essential (primary) hypertension: Secondary | ICD-10-CM | POA: Diagnosis present

## 2015-09-03 DIAGNOSIS — G894 Chronic pain syndrome: Secondary | ICD-10-CM | POA: Diagnosis present

## 2015-09-03 DIAGNOSIS — N3010 Interstitial cystitis (chronic) without hematuria: Secondary | ICD-10-CM | POA: Diagnosis present

## 2015-09-03 DIAGNOSIS — Z820 Family history of epilepsy and other diseases of the nervous system: Secondary | ICD-10-CM

## 2015-09-03 DIAGNOSIS — Z8249 Family history of ischemic heart disease and other diseases of the circulatory system: Secondary | ICD-10-CM

## 2015-09-03 LAB — HEPATIC FUNCTION PANEL
ALT: 15 U/L (ref 14–54)
AST: 20 U/L (ref 15–41)
Albumin: 4.5 g/dL (ref 3.5–5.0)
Alkaline Phosphatase: 91 U/L (ref 38–126)
BILIRUBIN DIRECT: 0.1 mg/dL (ref 0.1–0.5)
BILIRUBIN INDIRECT: 0.5 mg/dL (ref 0.3–0.9)
TOTAL PROTEIN: 8.8 g/dL — AB (ref 6.5–8.1)
Total Bilirubin: 0.6 mg/dL (ref 0.3–1.2)

## 2015-09-03 LAB — BASIC METABOLIC PANEL
ANION GAP: 7 (ref 5–15)
BUN: 15 mg/dL (ref 6–20)
CO2: 24 mmol/L (ref 22–32)
CREATININE: 0.9 mg/dL (ref 0.44–1.00)
Calcium: 9.3 mg/dL (ref 8.9–10.3)
Chloride: 107 mmol/L (ref 101–111)
GFR calc Af Amer: >60 mL/min (ref >60)
GLUCOSE: 124 mg/dL — AB (ref 65–99)
Potassium: 4 mmol/L (ref 3.5–5.1)
Sodium: 138 mmol/L (ref 135–145)

## 2015-09-03 LAB — LIPASE, BLOOD: LIPASE: 17 U/L (ref 11–51)

## 2015-09-03 LAB — URINE MICROSCOPIC-ADD ON

## 2015-09-03 LAB — CBC WITH DIFFERENTIAL/PLATELET
BASOS ABS: 0 10*3/uL (ref 0.0–0.1)
Basophils Relative: 0 %
EOS PCT: 1 %
Eosinophils Absolute: 0.1 10*3/uL (ref 0.0–0.7)
HEMATOCRIT: 44.1 % (ref 36.0–46.0)
Hemoglobin: 14.8 g/dL (ref 12.0–15.0)
LYMPHS PCT: 14 %
Lymphs Abs: 1.4 10*3/uL (ref 0.7–4.0)
MCH: 28 pg (ref 26.0–34.0)
MCHC: 33.6 g/dL (ref 30.0–36.0)
MCV: 83.4 fL (ref 78.0–100.0)
MONO ABS: 0.4 10*3/uL (ref 0.1–1.0)
MONOS PCT: 4 %
Neutro Abs: 8.3 10*3/uL — ABNORMAL HIGH (ref 1.7–7.7)
Neutrophils Relative %: 81 %
PLATELETS: 256 10*3/uL (ref 150–400)
RBC: 5.29 MIL/uL — ABNORMAL HIGH (ref 3.87–5.11)
RDW: 13.3 % (ref 11.5–15.5)
WBC: 10.2 10*3/uL (ref 4.0–10.5)

## 2015-09-03 LAB — URINALYSIS, ROUTINE W REFLEX MICROSCOPIC
BILIRUBIN URINE: NEGATIVE
Glucose, UA: NEGATIVE mg/dL
KETONES UR: NEGATIVE mg/dL
Nitrite: POSITIVE — AB
PH: 7 (ref 5.0–8.0)
PROTEIN: NEGATIVE mg/dL
Specific Gravity, Urine: 1.01 (ref 1.005–1.030)

## 2015-09-03 LAB — MRSA PCR SCREENING: MRSA by PCR: NEGATIVE

## 2015-09-03 MED ORDER — SODIUM CHLORIDE 0.9 % IV SOLN
1000.0000 mL | INTRAVENOUS | Status: DC
  Administered 2015-09-03 (×2): 1000 mL via INTRAVENOUS

## 2015-09-03 MED ORDER — SODIUM CHLORIDE 0.9 % IV SOLN: INTRAVENOUS | Status: DC

## 2015-09-03 MED ORDER — ONDANSETRON HCL 4 MG/2ML IJ SOLN
4.0000 mg | Freq: Four times a day (QID) | INTRAMUSCULAR | Status: DC | PRN
  Administered 2015-09-03 – 2015-09-07 (×10): 4 mg via INTRAVENOUS
  Filled 2015-09-03 (×10): qty 2

## 2015-09-03 MED ORDER — METOPROLOL SUCCINATE ER 25 MG PO TB24
25.0000 mg | ORAL_TABLET | Freq: Every day | ORAL | Status: DC
  Administered 2015-09-03 – 2015-09-07 (×5): 25 mg via ORAL
  Filled 2015-09-03 (×5): qty 1

## 2015-09-03 MED ORDER — DIPHENHYDRAMINE HCL 50 MG/ML IJ SOLN
25.0000 mg | Freq: Once | INTRAMUSCULAR | Status: AC
  Administered 2015-09-03: 25 mg via INTRAVENOUS
  Filled 2015-09-03: qty 1

## 2015-09-03 MED ORDER — ACETAMINOPHEN 325 MG PO TABS: 650.0000 mg | ORAL_TABLET | Freq: Four times a day (QID) | ORAL | Status: DC | PRN

## 2015-09-03 MED ORDER — POTASSIUM CHLORIDE IN NACL 20-0.9 MEQ/L-% IV SOLN
INTRAVENOUS | Status: DC
  Administered 2015-09-03: 1000 mL via INTRAVENOUS
  Administered 2015-09-04 – 2015-09-07 (×7): via INTRAVENOUS

## 2015-09-03 MED ORDER — FAMOTIDINE IN NACL 20-0.9 MG/50ML-% IV SOLN
20.0000 mg | Freq: Two times a day (BID) | INTRAVENOUS | Status: DC
  Administered 2015-09-03 – 2015-09-07 (×9): 20 mg via INTRAVENOUS
  Filled 2015-09-03 (×13): qty 50

## 2015-09-03 MED ORDER — ZOLPIDEM TARTRATE 5 MG PO TABS
5.0000 mg | ORAL_TABLET | Freq: Every evening | ORAL | Status: DC | PRN
  Administered 2015-09-06 (×2): 5 mg via ORAL
  Filled 2015-09-03: qty 1

## 2015-09-03 MED ORDER — HYDRALAZINE HCL 20 MG/ML IJ SOLN: 5.0000 mg | Freq: Four times a day (QID) | INTRAMUSCULAR | Status: DC | PRN

## 2015-09-03 MED ORDER — METOCLOPRAMIDE HCL 5 MG/ML IJ SOLN
10.0000 mg | Freq: Once | INTRAMUSCULAR | Status: AC
  Administered 2015-09-03: 10 mg via INTRAVENOUS
  Filled 2015-09-03: qty 2

## 2015-09-03 MED ORDER — ONDANSETRON HCL 4 MG/2ML IJ SOLN
4.0000 mg | Freq: Once | INTRAMUSCULAR | Status: AC
  Administered 2015-09-03: 4 mg via INTRAVENOUS
  Filled 2015-09-03: qty 2

## 2015-09-03 MED ORDER — ALBUTEROL SULFATE (2.5 MG/3ML) 0.083% IN NEBU: 2.5000 mg | INHALATION_SOLUTION | RESPIRATORY_TRACT | Status: DC | PRN

## 2015-09-03 MED ORDER — IOPAMIDOL (ISOVUE-300) INJECTION 61%
100.0000 mL | Freq: Once | INTRAVENOUS | Status: AC | PRN
  Administered 2015-09-03: 100 mL via INTRAVENOUS

## 2015-09-03 MED ORDER — FLUTICASONE PROPIONATE 50 MCG/ACT NA SUSP
1.0000 | Freq: Every day | NASAL | Status: DC
  Administered 2015-09-03 – 2015-09-06 (×4): 1 via NASAL
  Filled 2015-09-03 (×3): qty 16

## 2015-09-03 MED ORDER — PROMETHAZINE HCL 25 MG/ML IJ SOLN
12.5000 mg | Freq: Four times a day (QID) | INTRAMUSCULAR | Status: DC | PRN
  Administered 2015-09-03 – 2015-09-06 (×9): 12.5 mg via INTRAVENOUS
  Filled 2015-09-03 (×9): qty 1

## 2015-09-03 MED ORDER — CEFTRIAXONE SODIUM 1 G IJ SOLR
1.0000 g | INTRAMUSCULAR | Status: DC
  Administered 2015-09-03 – 2015-09-07 (×5): 1 g via INTRAVENOUS
  Filled 2015-09-03 (×7): qty 10

## 2015-09-03 MED ORDER — ONDANSETRON HCL 4 MG PO TABS: 4.0000 mg | ORAL_TABLET | Freq: Four times a day (QID) | ORAL | Status: DC | PRN

## 2015-09-03 MED ORDER — MORPHINE SULFATE (PF) 4 MG/ML IV SOLN
4.0000 mg | Freq: Once | INTRAVENOUS | Status: AC
Start: 2015-09-03 — End: 2015-09-03
  Administered 2015-09-03: 4 mg via INTRAVENOUS
  Filled 2015-09-03: qty 1

## 2015-09-03 MED ORDER — BUDESONIDE 0.25 MG/2ML IN SUSP
0.2500 mg | Freq: Two times a day (BID) | RESPIRATORY_TRACT | Status: DC
  Administered 2015-09-03 – 2015-09-06 (×4): 0.25 mg via RESPIRATORY_TRACT
  Filled 2015-09-03 (×8): qty 2

## 2015-09-03 MED ORDER — HYDROMORPHONE HCL 1 MG/ML IJ SOLN
0.5000 mg | INTRAMUSCULAR | Status: DC | PRN
  Administered 2015-09-03 – 2015-09-07 (×22): 0.5 mg via INTRAVENOUS
  Filled 2015-09-03 (×22): qty 1

## 2015-09-03 MED ORDER — SODIUM CHLORIDE 0.9 % IV SOLN
1000.0000 mL | Freq: Once | INTRAVENOUS | Status: AC
  Administered 2015-09-03: 1000 mL via INTRAVENOUS

## 2015-09-03 MED ORDER — MORPHINE SULFATE (PF) 4 MG/ML IV SOLN
4.0000 mg | Freq: Once | INTRAVENOUS | Status: AC
  Administered 2015-09-03: 4 mg via INTRAVENOUS
  Filled 2015-09-03: qty 1

## 2015-09-03 MED ORDER — ACETAMINOPHEN 650 MG RE SUPP: 650.0000 mg | Freq: Four times a day (QID) | RECTAL | Status: DC | PRN

## 2015-09-03 MED ORDER — HEPARIN SODIUM (PORCINE) 5000 UNIT/ML IJ SOLN
5000.0000 [IU] | Freq: Three times a day (TID) | INTRAMUSCULAR | Status: DC
  Administered 2015-09-03 – 2015-09-07 (×13): 5000 [IU] via SUBCUTANEOUS
  Filled 2015-09-03 (×13): qty 1

## 2015-09-03 NOTE — Consult Note (Addendum)
SURGICAL CONSULTATION NOTE (initial)  HISTORY OF PRESENT ILLNESS (HPI):  50 y.o. female presented with abdominal pain, nausea and vomiting after no BM or flatus x2 days. Patient denies any prior similar episodes, but has undergone bladder reconstruction for interstitial cystitis (01/2014 at [facility]), abdominal hysterectomy, and appendectomy in the past and reports chronic diarrhea (until 2 days ago) since her bladder reconstruction. Patient currently reports pain is somewhat improved and denies nausea. She refuses NGT at this time, but states she is willing to reconsider if her abdominal pain and nausea recur/worsen without resumption of bowel function, flatus. Patient denies fever/chills, CP, or SOB.  PAST MEDICAL HISTORY (PMH):  Past Medical History  Diagnosis Date  . Cystitis, interstitial   . Fibromyalgia   . Hypertension   . Migraine   . Sleep deprivation   . Acid reflux   . Asthma   . Pelvic floor dysfunction   . Vulvodynia   . Chronic pain     Fibromyalgia  . Chronic fatigue   . IBS (irritable bowel syndrome)   . Internal hemorrhoids     Colonoscopy 11/11 - Dr. Laural Golden     PAST SURGICAL HISTORY [facility]):  Past Surgical History  Procedure Laterality Date  . Bladder surgery    . Abdominal hysterectomy    . Appendectomy       MEDICATIONS:  Prior to Admission medications   Medication Sig Start Date End Date Taking? Authorizing Provider  albuterol (VENTOLIN HFA) 108 (90 BASE) MCG/ACT inhaler Inhale 2 puffs into the lungs every 6 (six) hours as needed for wheezing or shortness of breath.    Yes Historical Provider, MD  ALPRAZolam Duanne Moron) 0.5 MG tablet Take 0.25 mg by mouth 2 (two) times daily.    Yes Historical Provider, MD  baclofen (LIORESAL) 20 MG tablet Take 10-20 mg by mouth 3 (three) times daily.    Yes Historical Provider, MD  beclomethasone (QVAR) 40 MCG/ACT inhaler Inhale 2 puffs into the lungs 2 (two) times daily.   Yes Historical Provider, MD   diltiazem (CARDIZEM) 30 MG tablet Take 30 mg by mouth 3 (three) times daily.   Yes Historical Provider, MD  estradiol (ESTRACE) 2 MG tablet Take 2 mg by mouth daily.     Yes Historical Provider, MD  fluticasone (FLONASE) 50 MCG/ACT nasal spray Place 1 spray into both nostrils daily. 01/18/14  Yes Historical Provider, MD  furosemide (LASIX) 20 MG tablet Take 20 mg by mouth daily as needed for fluid.    Yes Historical Provider, MD  hyoscyamine (LEVSIN, ANASPAZ) 0.125 MG tablet Take 0.125 mg by mouth every 4 (four) hours as needed for cramping.   Yes Historical Provider, MD  lidocaine (XYLOCAINE) 2 % jelly Apply 1 application topically daily as needed (cath).  08/20/15  Yes Historical Provider, MD  metoprolol (TOPROL-XL) 100 MG 24 hr tablet Take 100 mg by mouth daily.     Yes Historical Provider, MD  omeprazole (PRILOSEC) 20 MG capsule Take 20 mg by mouth daily.    Yes Historical Provider, MD  Oxycodone HCl 10 MG TABS Take 1 tablet by mouth 4 (four) times daily. 08/25/15  Yes Historical Provider, MD  promethazine (PHENERGAN) 12.5 MG tablet Take 1 tablet by mouth daily as needed for nausea or vomiting.  02/25/14  Yes Historical Provider, MD  triamterene-hydrochlorothiazide (MAXZIDE-25) 37.5-25 MG per tablet Take 1 tablet by mouth daily.   Yes Historical Provider, MD  zolpidem (AMBIEN) 10 MG tablet Take 10 mg by mouth at  bedtime.    Yes Historical Provider, MD     ALLERGIES:  Allergies  Allergen Reactions  . Ciprofloxacin Rash     SOCIAL HISTORY:  Social History   Social History  . Marital Status: Married    Spouse Name: N/A  . Number of Children: 3  . Years of Education: N/A   Occupational History  . Disabled    Social History Main Topics  . Smoking status: Never Smoker   . Smokeless tobacco: Never Used  . Alcohol Use: No  . Drug Use: No  . Sexual Activity: Yes    Birth Control/ Protection: Surgical   Other Topics Concern  . Not on file   Social History Narrative    The  patient currently resides (home / rehab facility / nursing home): Home  The patient normally is (ambulatory / bedbound): Ambulatory   FAMILY HISTORY:  Family History  Problem Relation Age of Onset  . Coronary artery disease Father   . Lung cancer Father   . Kidney failure Mother     s/p renal transplantation  . Parkinsonism Brother    REVIEW OF SYSTEMS:  Constitutional: denies weight loss, fever, chills, or sweats  Eyes: denies any other vision changes, history of eye injury  ENT: denies sore throat, hearing problems  Respiratory: denies shortness of breath, wheezing  Cardiovascular: denies chest pain, palpitations  Gastrointestinal: abdominal pain, N/V, and diarrhea as per HPI  Musculoskeletal: denies any other joint pains or cramps  Skin: denies any other rashes or skin discolorations  Neurological: denies any other headache, dizziness, weakness  Psychiatric: denies any other depression, anxiety   All other review of systems were negative   VITAL SIGNS:  Temp:  [98 F (36.7 C)-99.1 F (37.3 C)] 98.3 F (36.8 C) (06/27 1202) Pulse Rate:  [76-93] 91 (06/27 1200) Resp:  [11-21] 20 (06/27 1200) BP: (103-148)/(73-95) 137/90 mmHg (06/27 1100) SpO2:  [96 %-100 %] 98 % (06/27 1200) Weight:  [61.5 kg (135 lb 9.3 oz)-62.596 kg (138 lb)] 61.5 kg (135 lb 9.3 oz) (06/27 0730)     Height: 5\' 1"  (154.9 cm) Weight: 61.5 kg (135 lb 9.3 oz) BMI (Calculated): 25.7   INTAKE/OUTPUT:  This shift: Total I/O In: -  Out: [id number] [Urine:875]  Last 2 shifts: @IOLAST2SHIFTS @   PHYSICAL EXAM:  Constitutional:  -- Normal body habitus  -- Awake, alert, and oriented x3  Eyes:  -- Pupils equally round and reactive to light  -- No scleral icterus  Ear, nose, and throat:  -- No jugular venous distension  Pulmonary:  -- No crackles  -- Equal breath sounds bilaterally  Cardiovascular:  -- S1, S2 present  -- No pericardial rubs GI/Abdomen:  -- Soft and nondistended with mild LLQ abdominal pain and  no guarding/rebound  -- No abdominal masses appreciated, pulsatile or otherwise  Musculoskeletal / Integumentary:  -- Wounds or skin discoloration: None  -- Extremities: B/L UE and LE FROM, hands and feet warm, no edema  Neurologic:  -- Motor function: intact and symmetric -- Sensation: intact and symmetric  Labs:  CBC:  Lab Results  Component Value Date   WBC 10.2 09/03/2015   RBC 5.29* 09/03/2015   BMP:  Lab Results  Component Value Date   GLUCOSE 124* 09/03/2015   CO2 24 09/03/2015   BUN 15 09/03/2015   CREATININE 0.90 09/03/2015   CALCIUM 9.3 09/03/2015     Imaging studies:  CT Abdomen and Pelvis with Contrast (09/03/2015) Low-grade small bowel obstruction with transition  point at the anastomosis in the mid- to distal- ileum.  Assessment/Plan:  50 y.o. female with small bowel obstruction, complicated by pertinent comorbidities including prior abdominal surgery including bladder reconstruction with small bowel anastomosis, fibromyalgia, irritable bowel syndrome, HTN, and GERD.   - NPO, IVF  - avoid narcotics  - nasogastric decompression advised   - serial abdominal exams, medical management of co-morbidities per medical team   - ambulation encouraged  - DVT prophylaxis  All of the above findings and recommendations were discussed with the patient and her family, and all of her and family's questions were answered to their expressed satisfaction.  Thank you for the opportunity to participate in the care for this patient.   -- Marilynne Drivers Rosana Hoes, [place]: [place] [facility] Office: [phone]

## 2015-09-03 NOTE — ED Notes (Signed)
Pt self caths at home. I& O catheter kit given to pt after making sure pt understood how to use. Pt verbalized understanding.

## 2015-09-03 NOTE — Progress Notes (Signed)
Consult for general surgery. Called and talked to doctor and acknowledged.

## 2015-09-03 NOTE — ED Provider Notes (Signed)
CSN: [id number]     Arrival date & time 09/03/15  0216 History   First MD Initiated Contact with Patient 09/03/15 0325     Chief Complaint  Patient presents with  . Abdominal Pain     (Consider location/radiation/quality/duration/timing/severity/associated sxs/prior Treatment) Patient is a 50 y.o. female presenting with abdominal pain. The history is provided by the patient.  Abdominal Pain She had onset this evening at about 6 PM of upper abdominal pain which has gone progressively more severe starting to radiate around to the back. She rates pain at 9/10. Nothing makes it better nothing makes it worse. She has had associated nausea with vomiting and there was some temporary improvement following emesis. There's been no constipation or diarrhea. She denies fever or chills. She is status post bladder reconstruction and has chronic discomfort in the suprapubic area which is unchanged from her baseline. She is status post appendectomy.  Past Medical History  Diagnosis Date  . Cystitis, interstitial   . Fibromyalgia   . Hypertension   . Migraine   . Sleep deprivation   . Acid reflux   . Asthma   . Pelvic floor dysfunction   . Vulvodynia   . Chronic pain   . Chronic fatigue   . IBS (irritable bowel syndrome)   . Internal hemorrhoids     Colonoscopy 11/11 - Dr. Laural Golden   Past Surgical History  Procedure Laterality Date  . Bladder surgery    . Abdominal hysterectomy    . Appendectomy     Family History  Problem Relation Age of Onset  . Coronary artery disease Father   . Lung cancer Father   . Kidney failure Mother     s/p renal transplantation  . Parkinsonism Brother    Social History  Substance Use Topics  . Smoking status: Never Smoker   . Smokeless tobacco: Never Used  . Alcohol Use: No   OB History    No data available     Review of Systems  Gastrointestinal: Positive for abdominal pain.  All other systems reviewed and are negative.     Allergies   Ciprofloxacin  Home Medications   Prior to Admission medications   Medication Sig Start Date End Date Taking? Authorizing Provider  albuterol (VENTOLIN HFA) 108 (90 BASE) MCG/ACT inhaler Inhale 2 puffs into the lungs every 6 (six) hours as needed for wheezing or shortness of breath.    Yes Historical Provider, MD  ALPRAZolam Duanne Moron) 0.5 MG tablet Take 0.25 mg by mouth 2 (two) times daily.    Yes Historical Provider, MD  baclofen (LIORESAL) 20 MG tablet Take 10 mg by mouth 3 (three) times daily.    Yes Historical Provider, MD  beclomethasone (QVAR) 40 MCG/ACT inhaler Inhale 2 puffs into the lungs 2 (two) times daily.   Yes Historical Provider, MD  diltiazem (CARDIZEM) 30 MG tablet Take 30 mg by mouth 3 (three) times daily.   Yes Historical Provider, MD  estradiol (ESTRACE) 2 MG tablet Take 2 mg by mouth daily.     Yes Historical Provider, MD  fluticasone (FLONASE) 50 MCG/ACT nasal spray Place 1 spray into both nostrils daily. 01/18/14  Yes Historical Provider, MD  furosemide (LASIX) 20 MG tablet Take 20 mg by mouth daily as needed for fluid.    Yes Historical Provider, MD  hyoscyamine (LEVSIN, ANASPAZ) 0.125 MG tablet Take 0.125 mg by mouth every 4 (four) hours as needed for cramping.   Yes Historical Provider, MD  lidocaine (XYLOCAINE) 2 %  injection 10 mLs by Infiltration route every 4 (four) hours. 03/13/13  Yes Historical Provider, MD  Lidocaine 0.5 % GEL Apply 1 application topically 2 (two) times daily. APPLY TO HEPARIN/SODIUM BICARB/LIDOCAINE INJECTION SITE.   Yes Historical Provider, MD  metoprolol (TOPROL-XL) 100 MG 24 hr tablet Take 100 mg by mouth daily.     Yes Historical Provider, MD  omeprazole (PRILOSEC) 20 MG capsule Take 20 mg by mouth daily.    Yes Historical Provider, MD  Oxycodone HCl 20 MG TABS Take 1 tablet by mouth 3 (three) times daily as needed (pain).  02/25/14  Yes Historical Provider, MD  promethazine (PHENERGAN) 12.5 MG tablet Take 1 tablet by mouth daily as needed for  nausea or vomiting.  02/25/14  Yes Historical Provider, MD  sulfamethoxazole-trimethoprim (BACTRIM DS,SEPTRA DS) 800-160 MG per tablet Take 1 tablet by mouth 2 (two) times daily. Starting 02/25/2014 x 10 days. 02/25/14  Yes Historical Provider, MD  triamterene-hydrochlorothiazide (MAXZIDE-25) 37.5-25 MG per tablet Take 1 tablet by mouth daily.   Yes Historical Provider, MD  zolpidem (AMBIEN) 10 MG tablet Take 10 mg by mouth at bedtime as needed for sleep.    Yes Historical Provider, MD  heparin 5000 UNIT/ML injection Inject 8 mLs into the vein every 4 (four) hours. 03/13/13   Historical Provider, MD  oxyCODONE-acetaminophen (PERCOCET/ROXICET) 5-325 MG per tablet Take 1 tablet by mouth every 4 (four) hours as needed for severe pain. Patient not taking: Reported on 03/02/2014 03/28/13   Lily Kocher, PA-C  oxyCODONE-acetaminophen (PERCOCET/ROXICET) 5-325 MG per tablet Take 1 tablet by mouth every 4 (four) hours as needed for severe pain. Patient not taking: Reported on 03/02/2014 03/28/13   Lily Kocher, PA-C  sodium bicarbonate 1 mEq/mL injection Inject 5 mLs into the peritoneum every 4 (four) hours. 03/13/13   Historical Provider, MD   BP 130/89 mmHg  Pulse 84  Temp(Src) 98 F (36.7 C)  Resp 18  Ht 5\' 1"  (1.549 m)  Wt 138 lb (62.596 kg)  BMI 26.09 kg/m2  SpO2 99% Physical Exam  Nursing note and vitals reviewed.  50 year old female, resting comfortably and in no acute distress. Vital signs are normal. Oxygen saturation is 99%, which is normal. Head is normocephalic and atraumatic. PERRLA, EOMI. Oropharynx is clear. Neck is nontender and supple without adenopathy or JVD. Back is nontender and there is no CVA tenderness. Lungs are clear without rales, wheezes, or rhonchi. Chest is nontender. Heart has regular rate and rhythm without murmur. Abdomen is soft, flat, with moderate to severe tenderness across the subcostal area with plus/minus Murphy sign. There is no rebound or guarding. Mild  suprapubic tenderness is present. There are no masses or hepatosplenomegaly and peristalsis is hypoactive. Extremities have no cyanosis or edema, full range of motion is present. Skin is warm and dry without rash. Neurologic: Mental status is normal, cranial nerves are intact, there are no motor or sensory deficits.  ED Course  Procedures (including critical care time) Labs Review Results for orders placed or performed during the hospital encounter of 09/03/15  CBC with Differential  Result Value Ref Range   WBC 10.2 4.0 - 10.5 K/uL   RBC 5.29 (H) 3.87 - 5.11 MIL/uL   Hemoglobin 14.8 12.0 - 15.0 g/dL   HCT 44.1 36.0 - 46.0 %   MCV 83.4 78.0 - 100.0 fL   MCH 28.0 26.0 - 34.0 pg   MCHC 33.6 30.0 - 36.0 g/dL   RDW 13.3 11.5 - 15.5 %  Platelets 256 150 - 400 K/uL   Neutrophils Relative % 81 %   Neutro Abs 8.3 (H) 1.7 - 7.7 K/uL   Lymphocytes Relative 14 %   Lymphs Abs 1.4 0.7 - 4.0 K/uL   Monocytes Relative 4 %   Monocytes Absolute 0.4 0.1 - 1.0 K/uL   Eosinophils Relative 1 %   Eosinophils Absolute 0.1 0.0 - 0.7 K/uL   Basophils Relative 0 %   Basophils Absolute 0.0 0.0 - 0.1 K/uL  Basic metabolic panel  Result Value Ref Range   Sodium 138 135 - 145 mmol/L   Potassium 4.0 3.5 - 5.1 mmol/L   Chloride 107 101 - 111 mmol/L   CO2 24 22 - 32 mmol/L   Glucose, Bld 124 (H) 65 - 99 mg/dL   BUN 15 6 - 20 mg/dL   Creatinine, Ser 0.90 0.44 - 1.00 mg/dL   Calcium 9.3 8.9 - 10.3 mg/dL   GFR calc non Af Amer >60 >60 mL/min   GFR calc Af Amer >60 >60 mL/min   Anion gap 7 5 - 15  Urinalysis, Routine w reflex microscopic (not at [facility])  Result Value Ref Range   Color, Urine YELLOW YELLOW   APPearance HAZY (A) CLEAR   Specific Gravity, Urine 1.010 1.005 - 1.030   pH 7.0 5.0 - 8.0   Glucose, UA NEGATIVE NEGATIVE mg/dL   Hgb urine dipstick TRACE (A) NEGATIVE   Bilirubin Urine NEGATIVE NEGATIVE   Ketones, ur NEGATIVE NEGATIVE mg/dL   Protein, ur NEGATIVE NEGATIVE mg/dL   Nitrite  POSITIVE (A) NEGATIVE   Leukocytes, UA MODERATE (A) NEGATIVE  Urine microscopic-add on  Result Value Ref Range   Squamous Epithelial / LPF 0-5 (A) NONE SEEN   WBC, UA 6-30 0 - 5 WBC/hpf   RBC / HPF 0-5 0 - 5 RBC/hpf   Bacteria, UA MANY (A) NONE SEEN  Lipase, blood  Result Value Ref Range   Lipase 17 11 - 51 U/L  Hepatic function panel  Result Value Ref Range   Total Protein 8.8 (H) 6.5 - 8.1 g/dL   Albumin 4.5 3.5 - 5.0 g/dL   AST 20 15 - 41 U/L   ALT 15 14 - 54 U/L   Alkaline Phosphatase 91 38 - 126 U/L   Total Bilirubin 0.6 0.3 - 1.2 mg/dL   Bilirubin, Direct 0.1 0.1 - 0.5 mg/dL   Indirect Bilirubin 0.5 0.3 - 0.9 mg/dL    Imaging Review Ct Abdomen Pelvis W Contrast  09/03/2015  CLINICAL DATA:  Lower abdominal pain for several days per EXAM: CT ABDOMEN AND PELVIS WITH CONTRAST TECHNIQUE: Multidetector CT imaging of the abdomen and pelvis was performed using the standard protocol following bolus administration of intravenous contrast. CONTRAST:  138mL ISOVUE-300 IOPAMIDOL (ISOVUE-300) INJECTION 61% COMPARISON:  None. FINDINGS: Lower chest:  No significant abnormality Hepatobiliary: There are normal appearances of the liver, gallbladder and bile ducts. Pancreas: Normal Spleen: Normal Adrenals/Urinary Tract: The kidneys and adrenals are normal. Proximal ureters are normal. There is a neobladder, unremarkable. Stomach/Bowel: There is abnormal dilatation of small bowel with transition point at the small bowel anastomosis of the mid to distal ileum. Terminal ileum is normal. Colon is unremarkable. Stomach is unremarkable. Vascular/Lymphatic: The abdominal aorta is normal in caliber. There is no atherosclerotic calcification. There is no adenopathy in the abdomen or pelvis. Reproductive: Hysterectomy.  No adnexal abnormalities. Other: Small volume ascites. No extraluminal air. Incidental small fat containing umbilical hernia. Musculoskeletal: No significant skeletal lesions. IMPRESSION: Low-grade  small bowel obstruction with transition point at the anastomosis in in the mid to distal ileum. Electronically Signed   By: Andreas Newport M.D.   On: 09/03/2015 05:23   I have personally reviewed and evaluated these images and lab results as part of my medical decision-making.    MDM   Final diagnoses:  Partial small bowel obstruction (HCC)    Abdominal pain of uncertain cause. Consider biliary colic, diverticulitis, pancreatitis. Screening labs are obtained and are unremarkable except for presence of bacteria in the urine which is not unexpected. Review of old records shows no relevant past visits. She will be sent for CT of abdomen and pelvis.  CT shows partial small bowel obstruction. Case is discussed with Dr. Marin Comment [facility] who agrees to admit the patient.  Delora Fuel, MD [phone]

## 2015-09-03 NOTE — Care Management Note (Signed)
Case Management Note  Patient Details  Name: Carol Thompson MRN: [id number] Date of Birth: 06/10/1965  Subjective/Objective:                  Pt admitted with partial bowel obstruction. Pt is from home, lives with her husband and is ind with ADL's. Pt has PCP, drives herself to appointments and has no difficulty affording her medications. Pt plans to return home with self care at DC.   Action/Plan: No CM needs anticipated.   Expected Discharge Date:  09/06/15               Expected Discharge Plan:  Home/Self Care  In-House Referral:  NA  Discharge planning Services  CM Consult  Post Acute Care Choice:  NA Choice offered to:  NA  DME Arranged:    DME Agency:     HH Arranged:    HH Agency:     Status of Service:  Completed, signed off  If discussed at [employer] of Stay Meetings, dates discussed:    Additional Comments:  Sherald Barge, RN 09/03/2015, 1:54 PM

## 2015-09-03 NOTE — H&P (Signed)
History and Physical    Carol Thompson [id number] DOB: [birth year] DOA: 09/03/2015  PCP: Selinda Flavin, MD     Urologist Dr. Logan Bores Patient coming from: Home  Chief Complaint: Abdominal pain  HPI: Carol Thompson is a 50 y.o. female with medical history significant for chronic interstitial cystitis, status post bladder reconstruction surgery, status post appendectomy, hypertension, and fibromyalgia, who presents to the ED with a chief complaint of abdominal pain. The pain started yesterday afternoon. It was initially located in the epigastrium, but it has now radiated to pain all over her abdomen. There is some radiation to her lower back. She describes the pain as sharp. It was intermittent, but it has been constant over the past 24 hours. Nothing she does makes the pain worse or better. She has some discomfort with urination. She denies bloody urine or tea-colored urine. She has subjective chills but no fever. She has chronically loose stools. She has had one episode of emesis.  ED Course: In the ED, she was afebrile and hemodynamically stable. CT scan of the abdomen/pelvis revealed low-grade small bowel obstruction with transition point at the anastomosis in the mid to distal ileum. Her lab data were significant for a mildly elevated glucose of 124. Later, her urinalysis revealed many bacteria, positive nitrite, and 6-30 WBCs. She is being admitted for further evaluation and management.  Review of Systems: As per HPI otherwise; she has chronic pain at the fibroid out her trigger points; she has chronic lower abdominal/pelvic pain from previous surgeries and symptoms from interstitial cystitis; otherwise 10 point review of systems negative.    Past Medical History  Diagnosis Date  . Cystitis, interstitial   . Fibromyalgia   . Hypertension   . Migraine   . Sleep deprivation   . Acid reflux   . Asthma   . Pelvic floor dysfunction   . Vulvodynia   . Chronic pain     Fibromyalgia  .  Chronic fatigue   . IBS (irritable bowel syndrome)   . Internal hemorrhoids     Colonoscopy 11/11 - Dr. Karilyn Cota    Past Surgical History  Procedure Laterality Date  . Bladder surgery    . Abdominal hysterectomy    . Appendectomy      Social history: She is married. She has 3 sons. She reports that she has never smoked. She has never used smokeless tobacco. She reports that she does not drink alcohol or use illicit drugs.  Allergies  Allergen Reactions  . Ciprofloxacin Rash    Family History  Problem Relation Age of Onset  . Coronary artery disease Father   . Lung cancer Father   . Kidney failure Mother     s/p renal transplantation  . Parkinsonism Brother      Prior to Admission medications   Medication Sig Start Date End Date Taking? Authorizing Provider  albuterol (VENTOLIN HFA) 108 (90 BASE) MCG/ACT inhaler Inhale 2 puffs into the lungs every 6 (six) hours as needed for wheezing or shortness of breath.    Yes Historical Provider, MD  ALPRAZolam Prudy Feeler) 0.5 MG tablet Take 0.25 mg by mouth 2 (two) times daily.    Yes Historical Provider, MD  baclofen (LIORESAL) 20 MG tablet Take 10 mg by mouth 3 (three) times daily.    Yes Historical Provider, MD  beclomethasone (QVAR) 40 MCG/ACT inhaler Inhale 2 puffs into the lungs 2 (two) times daily.   Yes Historical Provider, MD  diltiazem (CARDIZEM) 30 MG tablet Take 30 mg  by mouth 3 (three) times daily.   Yes Historical Provider, MD  estradiol (ESTRACE) 2 MG tablet Take 2 mg by mouth daily.     Yes Historical Provider, MD  fluticasone (FLONASE) 50 MCG/ACT nasal spray Place 1 spray into both nostrils daily. 01/18/14  Yes Historical Provider, MD  furosemide (LASIX) 20 MG tablet Take 20 mg by mouth daily as needed for fluid.    Yes Historical Provider, MD  hyoscyamine (LEVSIN, ANASPAZ) 0.125 MG tablet Take 0.125 mg by mouth every 4 (four) hours as needed for cramping.   Yes Historical Provider, MD  lidocaine (XYLOCAINE) 2 % injection 10  mLs by Infiltration route every 4 (four) hours. 03/13/13  Yes Historical Provider, MD  Lidocaine 0.5 % GEL Apply 1 application topically 2 (two) times daily. APPLY TO HEPARIN/SODIUM BICARB/LIDOCAINE INJECTION SITE.   Yes Historical Provider, MD  metoprolol (TOPROL-XL) 100 MG 24 hr tablet Take 100 mg by mouth daily.     Yes Historical Provider, MD  omeprazole (PRILOSEC) 20 MG capsule Take 20 mg by mouth daily.    Yes Historical Provider, MD  Oxycodone HCl 20 MG TABS Take 1 tablet by mouth 3 (three) times daily as needed (pain).  02/25/14  Yes Historical Provider, MD  promethazine (PHENERGAN) 12.5 MG tablet Take 1 tablet by mouth daily as needed for nausea or vomiting.  02/25/14  Yes Historical Provider, MD  sulfamethoxazole-trimethoprim (BACTRIM DS,SEPTRA DS) 800-160 MG per tablet Take 1 tablet by mouth 2 (two) times daily. Starting 02/25/2014 x 10 days. 02/25/14  Yes Historical Provider, MD  triamterene-hydrochlorothiazide (MAXZIDE-25) 37.5-25 MG per tablet Take 1 tablet by mouth daily.   Yes Historical Provider, MD  zolpidem (AMBIEN) 10 MG tablet Take 10 mg by mouth at bedtime as needed for sleep.    Yes Historical Provider, MD  heparin 5000 UNIT/ML injection Inject 8 mLs into the vein every 4 (four) hours. 03/13/13   Historical Provider, MD  oxyCODONE-acetaminophen (PERCOCET/ROXICET) 5-325 MG per tablet Take 1 tablet by mouth every 4 (four) hours as needed for severe pain. Patient not taking: Reported on 03/02/2014 03/28/13   Ivery Quale, PA-C  oxyCODONE-acetaminophen (PERCOCET/ROXICET) 5-325 MG per tablet Take 1 tablet by mouth every 4 (four) hours as needed for severe pain. Patient not taking: Reported on 03/02/2014 03/28/13   Ivery Quale, PA-C  sodium bicarbonate 1 mEq/mL injection Inject 5 mLs into the peritoneum every 4 (four) hours. 03/13/13   Historical Provider, MD    Physical Exam: Filed Vitals:   09/03/15 0600 09/03/15 0630 09/03/15 0706 09/03/15 0730  BP: 136/95 128/86  124/81  Pulse:  88 78  76  Temp:   99.1 F (37.3 C)   TempSrc:   Oral   Resp:      Height:   5\' 1"  (1.549 m) 5\' 1"  (1.549 m)  Weight:   61.5 kg (135 lb 9.3 oz) 61.5 kg (135 lb 9.3 oz)  SpO2: 99% 96%  100%      Constitutional: NAD, calm, comfortable Filed Vitals:   09/03/15 0600 09/03/15 0630 09/03/15 0706 09/03/15 0730  BP: 136/95 128/86  124/81  Pulse: 88 78  76  Temp:   99.1 F (37.3 C)   TempSrc:   Oral   Resp:      Height:   5\' 1"  (1.549 m) 5\' 1"  (1.549 m)  Weight:   61.5 kg (135 lb 9.3 oz) 61.5 kg (135 lb 9.3 oz)  SpO2: 99% 96%  100%   Eyes: PERRL, lids and conjunctivae  normal ENMT: Mucous membranes are mildly dry. Posterior pharynx clear of any exudate or lesions.Normal dentition.  Neck: normal, supple, no masses, no thyromegaly Respiratory: clear to auscultation bilaterally, no wheezing, no crackles. Normal respiratory effort. No accessory muscle use.  Cardiovascular: Regular rate and rhythm, no murmurs / rubs / gallops. No extremity edema. 2+ pedal pulses. No carotid bruits.  Abdomen: Positive bowel sounds, soft, minimally distended, diffusely tender mildly to moderately;  no masses palpated. No hepatosplenomegaly.  Well-healed suprapubic scar Musculoskeletal: no clubbing / cyanosis. No joint deformity upper and lower extremities. Good ROM, no contractures. Normal muscle tone.  Skin: no rashes, lesions, ulcers. No induration Neurologic: CN 2-12 grossly intact. Sensation intact, DTR normal. Strength 5/5 in all 4.  Psychiatric: Normal judgment and insight. Alert and oriented x 3. Normal mood.     Labs on Admission: I have personally reviewed following labs and imaging studies  CBC:  Recent Labs Lab 09/03/15 0227  WBC 10.2  NEUTROABS 8.3*  HGB 14.8  HCT 44.1  MCV 83.4  PLT 256   Basic Metabolic Panel:  Recent Labs Lab 09/03/15 0227  NA 138  K 4.0  CL 107  CO2 24  GLUCOSE 124*  BUN 15  CREATININE 0.90  CALCIUM 9.3   GFR: Estimated Creatinine Clearance: 62.9  mL/min (by C-G formula based on Cr of 0.9). Liver Function Tests:  Recent Labs Lab 09/03/15 0227  AST 20  ALT 15  ALKPHOS 91  BILITOT 0.6  PROT 8.8*  ALBUMIN 4.5    Recent Labs Lab 09/03/15 0227  LIPASE 17   No results for input(s): AMMONIA in the last 168 hours. Coagulation Profile: No results for input(s): INR, PROTIME in the last 168 hours. Cardiac Enzymes: No results for input(s): CKTOTAL, CKMB, CKMBINDEX, TROPONINI in the last 168 hours. BNP (last 3 results) No results for input(s): PROBNP in the last 8760 hours. HbA1C: No results for input(s): HGBA1C in the last 72 hours. CBG: No results for input(s): GLUCAP in the last 168 hours. Lipid Profile: No results for input(s): CHOL, HDL, LDLCALC, TRIG, CHOLHDL, LDLDIRECT in the last 72 hours. Thyroid Function Tests: No results for input(s): TSH, T4TOTAL, FREET4, T3FREE, THYROIDAB in the last 72 hours. Anemia Panel: No results for input(s): VITAMINB12, FOLATE, FERRITIN, TIBC, IRON, RETICCTPCT in the last 72 hours. Urine analysis:    Component Value Date/Time   COLORURINE YELLOW 09/03/2015 0241   APPEARANCEUR HAZY* 09/03/2015 0241   LABSPEC 1.010 09/03/2015 0241   PHURINE 7.0 09/03/2015 0241   GLUCOSEU NEGATIVE 09/03/2015 0241   HGBUR TRACE* 09/03/2015 0241   BILIRUBINUR NEGATIVE 09/03/2015 0241   KETONESUR NEGATIVE 09/03/2015 0241   PROTEINUR NEGATIVE 09/03/2015 0241   UROBILINOGEN 0.2 03/02/2014 1951   NITRITE POSITIVE* 09/03/2015 0241   LEUKOCYTESUR MODERATE* 09/03/2015 0241   Sepsis Labs: !!!!!!!!!!!!!!!!!!!!!!!!!!!!!!!!!!!!!!!!!!!! @LABRCNTIP (procalcitonin:4,lacticidven:4) )No results found for this or any previous visit (from the past 240 hour(s)).   Radiological Exams on Admission: Ct Abdomen Pelvis W Contrast  09/03/2015  CLINICAL DATA:  Lower abdominal pain for several days per EXAM: CT ABDOMEN AND PELVIS WITH CONTRAST TECHNIQUE: Multidetector CT imaging of the abdomen and pelvis was performed using the  standard protocol following bolus administration of intravenous contrast. CONTRAST:  ISOVUE-300 IOPAMIDOL (ISOVUE-300) INJECTION 61% COMPARISON:  None. FINDINGS: Lower chest:  No significant abnormality Hepatobiliary: There are normal appearances of the liver, gallbladder and bile ducts. Pancreas: Normal Spleen: Normal Adrenals/Urinary Tract: The kidneys and adrenals are normal. Proximal ureters are normal. There is a neobladder,  unremarkable. Stomach/Bowel: There is abnormal dilatation of small bowel with transition point at the small bowel anastomosis of the mid to distal ileum. Terminal ileum is normal. Colon is unremarkable. Stomach is unremarkable. Vascular/Lymphatic: The abdominal aorta is normal in caliber. There is no atherosclerotic calcification. There is no adenopathy in the abdomen or pelvis. Reproductive: Hysterectomy.  No adnexal abnormalities. Other: Small volume ascites. No extraluminal air. Incidental small fat containing umbilical hernia. Musculoskeletal: No significant skeletal lesions. IMPRESSION: Low-grade small bowel obstruction with transition point at the anastomosis in in the mid to distal ileum. Electronically Signed   By: Ellery Plunk M.D.   On: 09/03/2015 05:23    EKG: Not applicable  Assessment/Plan Principal Problem:   Partial small bowel obstruction (HCC) Active Problems:   Infection of urinary tract   Chronic interstitial cystitis   Essential hypertension   Fibromyalgia    1. Partial small bowel obstruction. The patient does not appear to be toxic. CT scan results noted. Her LFTs and lipase were within normal limits. -We'll start treatment with IV fluids, IV hydromorphone as needed, Zofran/Phenergan as needed for nausea, and scheduled IV Pepcid. Will make her virtually nothing by mouth with exception of sips of clear liquids and ice chips. -Gen. surgery has been consulted.  Urinary tract infection in a patient with chronic interstitial cystitis, status  post bladder reconstruction surgery. Patient urinalysis is consistent with infection. -Urine culture was sent. We'll start IV Rocephin.  Essential hypertension. Patient is treated with several blood pressure medications. Her blood pressure is on the low-normal side. We'll restart metoprolol at a lower dose and hold the other blood pressure medications.  Chronic pain syndrome. We'll hold her oral medications. Will treat her pain with as needed IV Dilaudid.  Asthma. We'll continue her chronic inhaler. Will add as needed albuterol nebulizer.   DVT prophylaxis: Subcutaneous heparin Code Status: Full code Family Communication: Discussed with mother and son Disposition Plan: Discharge to home in 2-3 days Consults called: General surgeon, Dr. Earlene Plater Admission status: Inpatient Joya Martyr MD [facility] Pager [phone]  If 7PM-7AM, please contact night-coverage [email or web address] Password [facility]  09/03/2015, 8:28 AM

## 2015-09-03 NOTE — ED Notes (Signed)
Pt c/o lower abd pain that radiates to left flank. Pt states she self caths.

## 2015-09-04 DIAGNOSIS — N3010 Interstitial cystitis (chronic) without hematuria: Secondary | ICD-10-CM

## 2015-09-04 DIAGNOSIS — K5669 Other intestinal obstruction: Secondary | ICD-10-CM

## 2015-09-04 DIAGNOSIS — N3000 Acute cystitis without hematuria: Secondary | ICD-10-CM

## 2015-09-04 LAB — CBC
HCT: 39.7 % (ref 36.0–46.0)
HEMOGLOBIN: 12.8 g/dL (ref 12.0–15.0)
MCH: 27.4 pg (ref 26.0–34.0)
MCHC: 32.2 g/dL (ref 30.0–36.0)
MCV: 85 fL (ref 78.0–100.0)
PLATELETS: 238 10*3/uL (ref 150–400)
RBC: 4.67 MIL/uL (ref 3.87–5.11)
RDW: 13.6 % (ref 11.5–15.5)
WBC: 7.5 10*3/uL (ref 4.0–10.5)

## 2015-09-04 LAB — BASIC METABOLIC PANEL
ANION GAP: 6 (ref 5–15)
BUN: 11 mg/dL (ref 6–20)
CALCIUM: 8.6 mg/dL — AB (ref 8.9–10.3)
CHLORIDE: 112 mmol/L — AB (ref 101–111)
CO2: 24 mmol/L (ref 22–32)
CREATININE: 0.8 mg/dL (ref 0.44–1.00)
GFR calc non Af Amer: >60 mL/min (ref >60)
Glucose, Bld: 103 mg/dL — ABNORMAL HIGH (ref 65–99)
Potassium: 4 mmol/L (ref 3.5–5.1)
SODIUM: 142 mmol/L (ref 135–145)

## 2015-09-04 NOTE — Care Management Important Message (Signed)
Important Message  Patient Details  Name: Carol Thompson MRN: [id number] Date of Birth: May 08, 1965   Medicare Important Message Given:  Yes    Delayni Streed, Chauncey Reading, RN 09/04/2015, 1:58 PM

## 2015-09-04 NOTE — Progress Notes (Signed)
PROGRESS NOTE    Carol Thompson  [id number] DOB: 1965-05-13 DOA: 09/03/2015 PCP: Selinda Flavin, MD     Brief Narrative:  50 year old woman admitted on 6/27 with complaints of abdominal pain. On CT scan of abdomen she was found to have a small bowel obstruction with transition point at the anastomosis in the mid to distal ileum. In addition she was found to have a UTI and was started on Rocephin. Surgical consultation has been obtained.   Assessment & Plan:   Principal Problem:   Partial small bowel obstruction (HCC) Active Problems:   Infection of urinary tract   Chronic interstitial cystitis   Essential hypertension   Fibromyalgia   Small bowel obstruction -NG in place, still nauseous, has passed flatus today. -Surgery is on board, hope to treat conservatively for now. -Recheck abdominal x-ray in a.m.  UTI -Culture with Escherichia coli, sensitivities pending.  -continue Rocephin for now.  Hypertension -Well controlled off medications given nothing by mouth state.  Asthma -Appears well controlled, urine albuterol.   DVT prophylaxis: Subcutaneous heparin Code Status: Full code Family Communication: Multiple family members at bedside updated on plan of care and all questions answered Disposition Plan: To be determined, home once medically ready  Consultants:   Surgery  Procedures:   None  Antimicrobials:   Rocephin    Subjective: Still nauseous, states she has been passing some gas this morning no bowel movement yet  Objective: Filed Vitals:   09/03/15 2005 09/03/15 2125 09/04/15 0528 09/04/15 0712  BP:  142/72 121/78   Pulse:  85 81   Temp:  98.7 F (37.1 C) 98.6 F (37 C)   TempSrc:  Oral Oral   Resp:  20 20   Height:      Weight:      SpO2: 97% 98% 97% 97%    Intake/Output Summary (Last 24 hours) at 09/04/15 1059 Last data filed at 09/04/15 0600  Gross per 24 hour  Intake      0 ml  Output   1900 ml  Net  -1900 ml   Filed  Weights   09/03/15 0224 09/03/15 0706 09/03/15 0730  Weight: 62.596 kg (138 lb) 61.5 kg (135 lb 9.3 oz) 61.5 kg (135 lb 9.3 oz)    Examination:  General exam: Alert, awake, oriented x 3 Respiratory system: Clear to auscultation. Respiratory effort normal. Cardiovascular system:RRR. No murmurs, rubs, gallops. Gastrointestinal system: Abdomen is nondistended, soft and nontender.Hypoactive bowel sounds  Central nervous system: Alert and oriented. No focal neurological deficits. Extremities: No C/C/E, +pedal pulses Skin: No rashes, lesions or ulcers Psychiatry: Judgement and insight appear normal. Mood & affect appropriate.     Data Reviewed: I have personally reviewed following labs and imaging studies  CBC:  Recent Labs Lab 09/03/15 0227 09/04/15 0542  WBC 10.2 7.5  NEUTROABS 8.3*  --   HGB 14.8 12.8  HCT 44.1 39.7  MCV 83.4 85.0  PLT 256 238   Basic Metabolic Panel:  Recent Labs Lab 09/03/15 0227 09/04/15 0542  NA 138 142  K 4.0 4.0  CL 107 112*  CO2 24 24  GLUCOSE 124* 103*  BUN 15 11  CREATININE 0.90 0.80  CALCIUM 9.3 8.6*   GFR: Estimated Creatinine Clearance: 70.8 mL/min (by C-G formula based on Cr of 0.8). Liver Function Tests:  Recent Labs Lab 09/03/15 0227  AST 20  ALT 15  ALKPHOS 91  BILITOT 0.6  PROT 8.8*  ALBUMIN 4.5    Recent Labs Lab 09/03/15  8657  LIPASE 17   No results for input(s): AMMONIA in the last 168 hours. Coagulation Profile: No results for input(s): INR, PROTIME in the last 168 hours. Cardiac Enzymes: No results for input(s): CKTOTAL, CKMB, CKMBINDEX, TROPONINI in the last 168 hours. BNP (last 3 results) No results for input(s): PROBNP in the last 8760 hours. HbA1C: No results for input(s): HGBA1C in the last 72 hours. CBG: No results for input(s): GLUCAP in the last 168 hours. Lipid Profile: No results for input(s): CHOL, HDL, LDLCALC, TRIG, CHOLHDL, LDLDIRECT in the last 72 hours. Thyroid Function Tests: No  results for input(s): TSH, T4TOTAL, FREET4, T3FREE, THYROIDAB in the last 72 hours. Anemia Panel: No results for input(s): VITAMINB12, FOLATE, FERRITIN, TIBC, IRON, RETICCTPCT in the last 72 hours. Urine analysis:    Component Value Date/Time   COLORURINE YELLOW 09/03/2015 0241   APPEARANCEUR HAZY* 09/03/2015 0241   LABSPEC 1.010 09/03/2015 0241   PHURINE 7.0 09/03/2015 0241   GLUCOSEU NEGATIVE 09/03/2015 0241   HGBUR TRACE* 09/03/2015 0241   BILIRUBINUR NEGATIVE 09/03/2015 0241   KETONESUR NEGATIVE 09/03/2015 0241   PROTEINUR NEGATIVE 09/03/2015 0241   UROBILINOGEN 0.2 03/02/2014 1951   NITRITE POSITIVE* 09/03/2015 0241   LEUKOCYTESUR MODERATE* 09/03/2015 0241   Sepsis Labs: @LABRCNTIP (procalcitonin:4,lacticidven:4)  ) Recent Results (from the past 240 hour(s))  Urine culture     Status: Abnormal (Preliminary result)   Collection Time: 09/03/15  2:41 AM  Result Value Ref Range Status   Specimen Description URINE, CLEAN CATCH  Final   Special Requests NONE  Final   Culture >=100,000 COLONIES/mL ESCHERICHIA COLI (A)  Final   Report Status PENDING  Incomplete  MRSA PCR Screening     Status: None   Collection Time: 09/03/15  7:12 AM  Result Value Ref Range Status   MRSA by PCR NEGATIVE NEGATIVE Final    Comment:        The GeneXpert MRSA Assay (FDA approved for NASAL specimens only), is one component of a comprehensive MRSA colonization surveillance program. It is not intended to diagnose MRSA infection nor to guide or monitor treatment for MRSA infections.          Radiology Studies: Ct Abdomen Pelvis W Contrast  09/03/2015  CLINICAL DATA:  Lower abdominal pain for several days per EXAM: CT ABDOMEN AND PELVIS WITH CONTRAST TECHNIQUE: Multidetector CT imaging of the abdomen and pelvis was performed using the standard protocol following bolus administration of intravenous contrast. CONTRAST:  ISOVUE-300 IOPAMIDOL (ISOVUE-300) INJECTION 61% COMPARISON:  None.  FINDINGS: Lower chest:  No significant abnormality Hepatobiliary: There are normal appearances of the liver, gallbladder and bile ducts. Pancreas: Normal Spleen: Normal Adrenals/Urinary Tract: The kidneys and adrenals are normal. Proximal ureters are normal. There is a neobladder, unremarkable. Stomach/Bowel: There is abnormal dilatation of small bowel with transition point at the small bowel anastomosis of the mid to distal ileum. Terminal ileum is normal. Colon is unremarkable. Stomach is unremarkable. Vascular/Lymphatic: The abdominal aorta is normal in caliber. There is no atherosclerotic calcification. There is no adenopathy in the abdomen or pelvis. Reproductive: Hysterectomy.  No adnexal abnormalities. Other: Small volume ascites. No extraluminal air. Incidental small fat containing umbilical hernia. Musculoskeletal: No significant skeletal lesions. IMPRESSION: Low-grade small bowel obstruction with transition point at the anastomosis in in the mid to distal ileum. Electronically Signed   By: Ellery Plunk M.D.   On: 09/03/2015 05:23        Scheduled Meds: . budesonide (PULMICORT) nebulizer solution  0.25 mg  Nebulization BID  . cefTRIAXone (ROCEPHIN)  IV  1 g Intravenous Q24H  . famotidine (PEPCID) IV  20 mg Intravenous Q12H  . fluticasone  1 spray Each Nare Daily  . heparin  5,000 Units Subcutaneous Q8H  . metoprolol succinate  25 mg Oral Daily   Continuous Infusions: . 0.9 % NaCl with KCl 20 mEq / L 100 mL/hr at 09/04/15 0745     LOS: 1 day    Time spent: 25 minutes. Greater than 50% of this time was spent in direct contact with the patient coordinating care.     Chaya Jan, MD [facility] Pager [phone]  If 7PM-7AM, please contact night-coverage [email or web address] Password [facility] 09/04/2015, 10:59 AM

## 2015-09-04 NOTE — Progress Notes (Signed)
[facility] Hospital Day(s): 1.   Post op day(s):  Marland Kitchen   Interval History: Patient seen and examined, no acute events or new complaints overnight. Patient reports her pain and bloating have improved significantly with a small amount of flatus once or twice, though she still complains of mild intermittent nausea and an episode of emesis at 3 am last night. She also has ambulated and denies any fever/chills, CP, or SOB.  Review of Systems:  Constitutional: denies fever, chills  HEENT: denies cough or congestion  Respiratory: denies any shortness of breath  Cardiovascular: denies chest pain or palpitations  Gastrointestinal: abdominal pain and N/V as per HPI  Musculoskeletal: denies pain, decreased motor or sensation  Neurological: denies HA or vision/hearing changes   Vital signs in last 24 hours: [min-max] current  Temp:  [98.3 F (36.8 C)-98.7 F (37.1 C)] 98.6 F (37 C) (06/28 0528) Pulse Rate:  [79-91] 81 (06/28 0528) Resp:  [11-20] 20 (06/28 0528) BP: (103-142)/(72-103) 121/78 mmHg (06/28 0528) SpO2:  [95 %-100 %] 97 % (06/28 0712)     Height: 5\' 1"  (154.9 cm) Weight: 61.5 kg (135 lb 9.3 oz) BMI (Calculated): 25.7   Intake/Output this shift:      Intake/Output last 2 shifts:  @IOLAST2SHIFTS @   Physical Exam:  Constitutional: alert, cooperative and no distress  HENT: normocephalic without obvious abnormality  Eyes: PERRL, EOM's grossly intact and symmetric  Neuro: CN II - XII grossly intact and symmetric without deficit  Respiratory: breathing non-labored at rest  Cardiovascular: regular rate and sinus rhythm  Gastrointestinal: soft, less tender, and noticeably less distention  Musculoskeletal: UE and LE FROM, no wounds with motor and sensation grossly intact  Labs:  CBC:  Lab Results  Component Value Date   WBC 7.5 09/04/2015   RBC 4.67 09/04/2015   BMP:  Lab Results  Component Value Date   GLUCOSE 103* 09/04/2015   CO2 24 09/04/2015   BUN 11  09/04/2015   CREATININE 0.80 09/04/2015   CALCIUM 8.6* 09/04/2015     Imaging studies: No new pertinent imaging studies    Assessment/Plan:  50 y.o. female with small bowel obstruction, complicated by pertinent comorbidities including prior bladder reconstruction with small bowel anastomosis, fibromyalgia, irritable bowel syndrome, HTN, and GERD.  - NPO, IVF - minimize narcotics (chronic) - nasogastric decompression for now  - serial abdominal exams, medical management of co-morbidities per medical team - ambulation encouraged - DVT prophylaxis  All of the above findings and recommendations were discussed with the patient and her family, and all of her and family's questions were answered to their expressed satisfaction.  Thank you for the opportunity to participate in the care for this patient.   -- Marilynne Drivers Rosana Hoes, [place]: [place] [facility] Office: [phone]

## 2015-09-05 ENCOUNTER — Inpatient Hospital Stay (HOSPITAL_COMMUNITY): Payer: Medicare PPO

## 2015-09-05 LAB — URINE CULTURE: Culture: >=100000 — AB

## 2015-09-05 NOTE — Progress Notes (Signed)
[facility] Hospital Day(s): 2.   Post op day(s):  Marland Kitchen   Interval History: Patient seen and examined, no acute events or new complaints overnight. Patient reports abdominal pain has nearly resolved with substantially less bloating, no further emesis, and abdominal "rumbling", but only minimal flatus last night. She also says she still feels something that she thinks is nausea, but attributes the sensation to her NG tube and increased hunger. Patient denies any fever/chills, CP, or SOB and has been ambulating.  Review of Systems:  Constitutional: denies fever, chills  HEENT: denies cough or congestion  Respiratory: denies any shortness of breath  Cardiovascular: denies chest pain or palpitations  Gastrointestinal: abdominal pain, bloating, and nausea as per HPI  Musculoskeletal: denies pain, decreased motor or sensation  Neurological: denies HA or vision/hearing changes   Vital signs in last 24 hours: [min-max] current  Temp:  [97.9 F (36.6 C)-98.7 F (37.1 C)] 98.2 F (36.8 C) (06/29 0835) Pulse Rate:  [74-80] 76 (06/29 0835) Resp:  [18-20] 18 (06/29 0835) BP: (112-117)/(75-77) 112/75 mmHg (06/29 0835) SpO2:  [96 %-100 %] 98 % (06/29 0835)     Height: 5\' 1"  (154.9 cm) Weight: 61.5 kg (135 lb 9.3 oz) BMI (Calculated): 25.7   Intake/Output this shift:  Total I/O In: -  Out: 650 [Emesis/NG output:650]   Intake/Output last 2 shifts:  @IOLAST2SHIFTS @   Physical Exam:  Constitutional: alert, cooperative and no distress  HENT: normocephalic without obvious abnormality  Eyes: PERRL, EOM's grossly intact and symmetric  Neuro: CN II - XII grossly intact and symmetric without deficit  Respiratory: breathing non-labored at rest  Cardiovascular: regular rate and sinus rhythm  Gastrointestinal: completely soft, non-tender, and non-distended  Musculoskeletal: UE and LE FROM, no edema or wounds, motor and sensation grossly intact, NT   Labs:  CBC:  Lab Results   Component Value Date   WBC 7.5 09/04/2015   RBC 4.67 09/04/2015   BMP:  Lab Results  Component Value Date   GLUCOSE 103* 09/04/2015   CO2 24 09/04/2015   BUN 11 09/04/2015   CREATININE 0.80 09/04/2015   CALCIUM 8.6* 09/04/2015     Assessment/Plan:  50 y.o. female with small bowel obstruction, complicated by pertinent comorbidities including prior bladder reconstruction with small bowel anastomosis, fibromyalgia, irritable bowel syndrome, HTN, and GERD.   - NPO for now, IVF - minimize narcotics (chronic) - nasogastric decompression for now   - will reassess pm and will d/c NGT and start CLD as soon as passing flatus - medical management of co-morbidities per medical team - ambulation encouraged - DVT prophylaxis  All of the above findings and recommendations were discussed with the patient and her family, and all of her and family's questions were answered to their expressed satisfaction.  Thank you for the opportunity to participate in the care for this patient.   -- Marilynne Drivers Rosana Hoes, [place]: [place] [facility] Office: [phone]

## 2015-09-05 NOTE — Progress Notes (Signed)
PROGRESS NOTE    Carol Thompson  [id number] DOB: 1965/05/27 DOA: 09/03/2015 PCP: Selinda Flavin, MD     Brief Narrative:  50 year old woman admitted on 6/27 with complaints of abdominal pain. On CT scan of abdomen she was found to have a small bowel obstruction with transition point at the anastomosis in the mid to distal ileum. In addition she was found to have a UTI and was started on Rocephin. Surgical consultation has been obtained.   Assessment & Plan:   Principal Problem:   Partial small bowel obstruction (HCC) Active Problems:   Infection of urinary tract   Chronic interstitial cystitis   Essential hypertension   Fibromyalgia   Small bowel obstruction -NG in place, still nauseous, has passed flatus today. Has had significant NG output overnight. -Surgery is on board, hope to treat conservatively for now. -Abdominal film with persistence of SBO. -Will defer management of NG tube to surgery.  Escherichia coli UTI  -Continue Rocephin.  Hypertension -Well controlled off medications given nothing by mouth state.  Asthma -Appears well controlled, continue albuterol.   DVT prophylaxis: Subcutaneous heparin Code Status: Full code Family Communication: Patient only Disposition Plan: To be determined, home once medically ready  Consultants:   Surgery  Procedures:   None  Antimicrobials:   Rocephin    Subjective: Still nauseous, states she has been passing some gas this morning as well as 2 small bowel movements  Objective: Filed Vitals:   09/04/15 2211 09/05/15 0736 09/05/15 0835 09/05/15 1300  BP: 113/76  112/75 126/63  Pulse: 74  76 75  Temp: 97.9 F (36.6 C)  98.2 F (36.8 C) 97.6 F (36.4 C)  TempSrc: Oral  Oral Oral  Resp: 20  18 18   Height:      Weight:      SpO2: 96% 98% 98% 100%    Intake/Output Summary (Last 24 hours) at 09/05/15 1653 Last data filed at 09/05/15 1600  Gross per 24 hour  Intake      0 ml  Output   3950 ml  Net   -3950 ml   Filed Weights   09/03/15 0224 09/03/15 0706 09/03/15 0730  Weight: 62.596 kg (138 lb) 61.5 kg (135 lb 9.3 oz) 61.5 kg (135 lb 9.3 oz)    Examination:  General exam: Alert, awake, oriented x 3 Respiratory system: Clear to auscultation. Respiratory effort normal. Cardiovascular system:RRR. No murmurs, rubs, gallops. Gastrointestinal system: Slightly distended, slightly tender to palpation diffusely, hypoactive bowel sounds  Central nervous system: Alert and oriented. No focal neurological deficits. Extremities: No C/C/E, +pedal pulses Skin: No rashes, lesions or ulcers Psychiatry: Judgement and insight appear normal. Mood & affect appropriate.     Data Reviewed: I have personally reviewed following labs and imaging studies  CBC:  Recent Labs Lab 09/03/15 0227 09/04/15 0542  WBC 10.2 7.5  NEUTROABS 8.3*  --   HGB 14.8 12.8  HCT 44.1 39.7  MCV 83.4 85.0  PLT 256 238   Basic Metabolic Panel:  Recent Labs Lab 09/03/15 0227 09/04/15 0542  NA 138 142  K 4.0 4.0  CL 107 112*  CO2 24 24  GLUCOSE 124* 103*  BUN 15 11  CREATININE 0.90 0.80  CALCIUM 9.3 8.6*   GFR: Estimated Creatinine Clearance: 70.8 mL/min (by C-G formula based on Cr of 0.8). Liver Function Tests:  Recent Labs Lab 09/03/15 0227  AST 20  ALT 15  ALKPHOS 91  BILITOT 0.6  PROT 8.8*  ALBUMIN 4.5  Recent Labs Lab 09/03/15 0227  LIPASE 17   No results for input(s): AMMONIA in the last 168 hours. Coagulation Profile: No results for input(s): INR, PROTIME in the last 168 hours. Cardiac Enzymes: No results for input(s): CKTOTAL, CKMB, CKMBINDEX, TROPONINI in the last 168 hours. BNP (last 3 results) No results for input(s): PROBNP in the last 8760 hours. HbA1C: No results for input(s): HGBA1C in the last 72 hours. CBG: No results for input(s): GLUCAP in the last 168 hours. Lipid Profile: No results for input(s): CHOL, HDL, LDLCALC, TRIG, CHOLHDL, LDLDIRECT in the last 72  hours. Thyroid Function Tests: No results for input(s): TSH, T4TOTAL, FREET4, T3FREE, THYROIDAB in the last 72 hours. Anemia Panel: No results for input(s): VITAMINB12, FOLATE, FERRITIN, TIBC, IRON, RETICCTPCT in the last 72 hours. Urine analysis:    Component Value Date/Time   COLORURINE YELLOW 09/03/2015 0241   APPEARANCEUR HAZY* 09/03/2015 0241   LABSPEC 1.010 09/03/2015 0241   PHURINE 7.0 09/03/2015 0241   GLUCOSEU NEGATIVE 09/03/2015 0241   HGBUR TRACE* 09/03/2015 0241   BILIRUBINUR NEGATIVE 09/03/2015 0241   KETONESUR NEGATIVE 09/03/2015 0241   PROTEINUR NEGATIVE 09/03/2015 0241   UROBILINOGEN 0.2 03/02/2014 1951   NITRITE POSITIVE* 09/03/2015 0241   LEUKOCYTESUR MODERATE* 09/03/2015 0241   Sepsis Labs: @LABRCNTIP (procalcitonin:4,lacticidven:4)  ) Recent Results (from the past 240 hour(s))  Urine culture     Status: Abnormal   Collection Time: 09/03/15  2:41 AM  Result Value Ref Range Status   Specimen Description URINE, CLEAN CATCH  Final   Special Requests NONE  Final   Culture >=100,000 COLONIES/mL ESCHERICHIA COLI (A)  Final   Report Status 09/05/2015 FINAL  Final   Organism ID, Bacteria ESCHERICHIA COLI (A)  Final      Susceptibility   Escherichia coli - MIC*    AMPICILLIN >=32 RESISTANT Resistant     CEFAZOLIN >=64 RESISTANT Resistant     CEFTRIAXONE 8 SENSITIVE Sensitive     CIPROFLOXACIN <=0.25 SENSITIVE Sensitive     GENTAMICIN <=1 SENSITIVE Sensitive     IMIPENEM <=0.25 SENSITIVE Sensitive     NITROFURANTOIN <=16 SENSITIVE Sensitive     TRIMETH/SULFA <=20 SENSITIVE Sensitive     AMPICILLIN/SULBACTAM >=32 RESISTANT Resistant     PIP/TAZO <=4 SENSITIVE Sensitive     * >=100,000 COLONIES/mL ESCHERICHIA COLI  MRSA PCR Screening     Status: None   Collection Time: 09/03/15  7:12 AM  Result Value Ref Range Status   MRSA by PCR NEGATIVE NEGATIVE Final    Comment:        The GeneXpert MRSA Assay (FDA approved for NASAL specimens only), is one component of  a comprehensive MRSA colonization surveillance program. It is not intended to diagnose MRSA infection nor to guide or monitor treatment for MRSA infections.          Radiology Studies: Dg Abd 2 Views  09/05/2015  CLINICAL DATA:  Small bowel obstruction. abd pain EXAM: ABDOMEN - 2 VIEW COMPARISON:  09/03/2015 FINDINGS: Nasogastric tube is in place, tip overlying the level of the distal stomach. Contrast is identified within a mildly dilated central abdominal small bowel loop. Surgical clips are noted in the right lower quadrant stool is identified within nondilated loops of colon. IMPRESSION: Persistent dilatation of central small bowel loop, containing retained contrast. Findings are consistent with partial small bowel obstruction. Electronically Signed   By: Norva Pavlov M.D.   On: 09/05/2015 09:27        Scheduled Meds: . budesonide (PULMICORT)  nebulizer solution  0.25 mg Nebulization BID  . cefTRIAXone (ROCEPHIN)  IV  1 g Intravenous Q24H  . famotidine (PEPCID) IV  20 mg Intravenous Q12H  . fluticasone  1 spray Each Nare Daily  . heparin  5,000 Units Subcutaneous Q8H  . metoprolol succinate  25 mg Oral Daily   Continuous Infusions: . 0.9 % NaCl with KCl 20 mEq / L 100 mL/hr at 09/05/15 1559     LOS: 2 days    Time spent: 25 minutes. Greater than 50% of this time was spent in direct contact with the patient coordinating care.     Chaya Jan, MD [facility] Pager [phone]  If 7PM-7AM, please contact night-coverage [email or web address] Password [facility] 09/05/2015, 4:53 PM

## 2015-09-06 DIAGNOSIS — I10 Essential (primary) hypertension: Secondary | ICD-10-CM

## 2015-09-06 MED ORDER — BUDESONIDE 0.25 MG/2ML IN SUSP
0.2500 mg | Freq: Every day | RESPIRATORY_TRACT | Status: DC
  Administered 2015-09-07: 0.25 mg via RESPIRATORY_TRACT
  Filled 2015-09-06: qty 2

## 2015-09-06 NOTE — Progress Notes (Signed)
[facility] Hospital Day(s): 3.   Post op day(s):  Marland Kitchen   Interval History: Patient seen and examined, no acute events or new complaints overnight. Patient reports more frequent flatus, 2 small bowel movements, and much improved appetite, denies pain, nausea/vomiting, fever/chills, CP, or SOB.  Review of Systems:  Constitutional: denies fever, chills  HEENT: denies cough or congestion  Respiratory: denies any shortness of breath  Cardiovascular: denies chest pain or palpitations  Gastrointestinal: denies N/V, diarrhea or constipation  Musculoskeletal: denies pain, decreased motor or sensation  Neurological: denies HA or vision/hearing changes   Vital signs in last 24 hours: [min-max] current  Temp:  [97.6 F (36.4 C)-98.2 F (36.8 C)] 98.1 F (36.7 C) (06/30 0534) Pulse Rate:  [71-78] 71 (06/30 0534) Resp:  [18-20] 20 (06/30 0534) BP: (112-133)/(63-82) 133/82 mmHg (06/30 0534) SpO2:  [98 %-100 %] 98 % (06/30 0732)     Height: 5\' 1"  (154.9 cm) Weight: 61.5 kg (135 lb 9.3 oz) BMI (Calculated): 25.7   Intake/Output this shift:      Intake/Output last 2 shifts:  @IOLAST2SHIFTS @   Physical Exam:  Constitutional: alert, cooperative and no distress  HENT: normocephalic without obvious abnormality  Eyes: PERRL, EOM's grossly intact and symmetric  Neuro: CN II - XII grossly intact and symmetric without deficit  Respiratory: breathing non-labored at rest  Cardiovascular: regular rate and sinus rhythm  Gastrointestinal: soft, non-tender, and non-distended  Musculoskeletal: UE and LE FROM, no edema or wounds, motor and sensation grossly intact, NT   Labs:  CBC:  Lab Results  Component Value Date   WBC 7.5 09/04/2015   RBC 4.67 09/04/2015   BMP:  Lab Results  Component Value Date   GLUCOSE 103* 09/04/2015   CO2 24 09/04/2015   BUN 11 09/04/2015   CREATININE 0.80 09/04/2015   CALCIUM 8.6* 09/04/2015     Assessment/Plan:  50 y.o. female with resolving small  bowel obstruction, complicated by pertinent comorbidities including prior bladder reconstruction with small bowel anastomosis, fibromyalgia, irritable bowel syndrome, HTN, and GERD.   - discontinue NGT  - okay to start clear liquids - minimize narcotics (chronic) - medical management of co-morbidities per medical team - ambulation encouraged - DVT prophylaxis  All of the above findings and recommendations were discussed with the patient and her family, and all of her and family's questions were answered to their expressed satisfaction.  Thank you for the opportunity to participate in the care for this patient.   -- Marilynne Drivers Rosana Hoes, [place]: [facility] Office: [phone]

## 2015-09-06 NOTE — Progress Notes (Signed)
PROGRESS NOTE    Carol VENECIA  Thompson:[id number] DOB: [birth year] DOA: 09/03/2015 PCP: Selinda Flavin, MD     Brief Narrative:  50 year old woman admitted on 6/27 with complaints of abdominal pain. On CT scan of abdomen she was found to have a small bowel obstruction with transition point at the anastomosis in the mid to distal ileum. In addition she was found to have a UTI and was started on Rocephin. Surgical consultation has been obtained.   Assessment & Plan:   Principal Problem:   Partial small bowel obstruction (HCC) Active Problems:   Infection of urinary tract   Chronic interstitial cystitis   Essential hypertension   Fibromyalgia   Small bowel obstruction -NG removed 6/30. -Advancing diet. -Hope for DC in 24 hours.  Escherichia coli UTI  -Continue Rocephin. -Can transition to cipro at DC if abx still required.  Hypertension -Well controlled off medications given nothing by mouth state.  Asthma -Appears well controlled, continue albuterol.   DVT prophylaxis: Subcutaneous heparin Code Status: Full code Family Communication: Multiple family members at bedside updated on plan of care and questions answered Disposition Plan: Hope for DC home in 24 hours.  Consultants:   Surgery  Procedures:   None  Antimicrobials:   Rocephin    Subjective: Feels better, NG is out.  Objective: Filed Vitals:   09/05/15 2144 09/06/15 0534 09/06/15 0732 09/06/15 1324  BP: 115/74 133/82  125/71  Pulse: 78 71  74  Temp: 98.1 F (36.7 C) 98.1 F (36.7 C)  98.7 F (37.1 C)  TempSrc: Oral Oral  Oral  Resp: 20 20  18   Height:      Weight:      SpO2: 100% 99% 98% 97%    Intake/Output Summary (Last 24 hours) at 09/06/15 1711 Last data filed at 09/06/15 1242  Gross per 24 hour  Intake    340 ml  Output   1550 ml  Net  -1210 ml   Filed Weights   09/03/15 0224 09/03/15 0706 09/03/15 0730  Weight: 62.596 kg (138 lb) 61.5 kg (135 lb 9.3 oz) 61.5 kg (135 lb  9.3 oz)    Examination:  General exam: Alert, awake, oriented x 3 Respiratory system: Clear to auscultation. Respiratory effort normal. Cardiovascular system:RRR. No murmurs, rubs, gallops. Gastrointestinal system: Slightly distended, slightly tender to palpation diffusely, hypoactive bowel sounds  Central nervous system: Alert and oriented. No focal neurological deficits. Extremities: No C/C/E, +pedal pulses Skin: No rashes, lesions or ulcers Psychiatry: Judgement and insight appear normal. Mood & affect appropriate.     Data Reviewed: I have personally reviewed following labs and imaging studies  CBC:  Recent Labs Lab 09/03/15 0227 09/04/15 0542  WBC 10.2 7.5  NEUTROABS 8.3*  --   HGB 14.8 12.8  HCT 44.1 39.7  MCV 83.4 85.0  PLT 256 238   Basic Metabolic Panel:  Recent Labs Lab 09/03/15 0227 09/04/15 0542  NA 138 142  K 4.0 4.0  CL 107 112*  CO2 24 24  GLUCOSE 124* 103*  BUN 15 11  CREATININE 0.90 0.80  CALCIUM 9.3 8.6*   GFR: Estimated Creatinine Clearance: 70.8 mL/min (by C-G formula based on Cr of 0.8). Liver Function Tests:  Recent Labs Lab 09/03/15 0227  AST 20  ALT 15  ALKPHOS 91  BILITOT 0.6  PROT 8.8*  ALBUMIN 4.5    Recent Labs Lab 09/03/15 0227  LIPASE 17   No results for input(s): AMMONIA in the last 168  hours. Coagulation Profile: No results for input(s): INR, PROTIME in the last 168 hours. Cardiac Enzymes: No results for input(s): CKTOTAL, CKMB, CKMBINDEX, TROPONINI in the last 168 hours. BNP (last 3 results) No results for input(s): PROBNP in the last 8760 hours. HbA1C: No results for input(s): HGBA1C in the last 72 hours. CBG: No results for input(s): GLUCAP in the last 168 hours. Lipid Profile: No results for input(s): CHOL, HDL, LDLCALC, TRIG, CHOLHDL, LDLDIRECT in the last 72 hours. Thyroid Function Tests: No results for input(s): TSH, T4TOTAL, FREET4, T3FREE, THYROIDAB in the last 72 hours. Anemia Panel: No results  for input(s): VITAMINB12, FOLATE, FERRITIN, TIBC, IRON, RETICCTPCT in the last 72 hours. Urine analysis:    Component Value Date/Time   COLORURINE YELLOW 09/03/2015 0241   APPEARANCEUR HAZY* 09/03/2015 0241   LABSPEC 1.010 09/03/2015 0241   PHURINE 7.0 09/03/2015 0241   GLUCOSEU NEGATIVE 09/03/2015 0241   HGBUR TRACE* 09/03/2015 0241   BILIRUBINUR NEGATIVE 09/03/2015 0241   KETONESUR NEGATIVE 09/03/2015 0241   PROTEINUR NEGATIVE 09/03/2015 0241   UROBILINOGEN 0.2 03/02/2014 1951   NITRITE POSITIVE* 09/03/2015 0241   LEUKOCYTESUR MODERATE* 09/03/2015 0241   Sepsis Labs: @LABRCNTIP (procalcitonin:4,lacticidven:4)  ) Recent Results (from the past 240 hour(s))  Urine culture     Status: Abnormal   Collection Time: 09/03/15  2:41 AM  Result Value Ref Range Status   Specimen Description URINE, CLEAN CATCH  Final   Special Requests NONE  Final   Culture >=100,000 COLONIES/mL ESCHERICHIA COLI (A)  Final   Report Status 09/05/2015 FINAL  Final   Organism ID, Bacteria ESCHERICHIA COLI (A)  Final      Susceptibility   Escherichia coli - MIC*    AMPICILLIN >=32 RESISTANT Resistant     CEFAZOLIN >=64 RESISTANT Resistant     CEFTRIAXONE 8 SENSITIVE Sensitive     CIPROFLOXACIN <=0.25 SENSITIVE Sensitive     GENTAMICIN <=1 SENSITIVE Sensitive     IMIPENEM <=0.25 SENSITIVE Sensitive     NITROFURANTOIN <=16 SENSITIVE Sensitive     TRIMETH/SULFA <=20 SENSITIVE Sensitive     AMPICILLIN/SULBACTAM >=32 RESISTANT Resistant     PIP/TAZO <=4 SENSITIVE Sensitive     * >=100,000 COLONIES/mL ESCHERICHIA COLI  MRSA PCR Screening     Status: None   Collection Time: 09/03/15  7:12 AM  Result Value Ref Range Status   MRSA by PCR NEGATIVE NEGATIVE Final    Comment:        The GeneXpert MRSA Assay (FDA approved for NASAL specimens only), is one component of a comprehensive MRSA colonization surveillance program. It is not intended to diagnose MRSA infection nor to guide or monitor treatment  for MRSA infections.          Radiology Studies: Dg Abd 2 Views  09/05/2015  CLINICAL DATA:  Small bowel obstruction. abd pain EXAM: ABDOMEN - 2 VIEW COMPARISON:  09/03/2015 FINDINGS: Nasogastric tube is in place, tip overlying the level of the distal stomach. Contrast is identified within a mildly dilated central abdominal small bowel loop. Surgical clips are noted in the right lower quadrant stool is identified within nondilated loops of colon. IMPRESSION: Persistent dilatation of central small bowel loop, containing retained contrast. Findings are consistent with partial small bowel obstruction. Electronically Signed   By: Norva Pavlov M.D.   On: 09/05/2015 09:27        Scheduled Meds: . budesonide (PULMICORT) nebulizer solution  0.25 mg Nebulization BID  . cefTRIAXone (ROCEPHIN)  IV  1 g Intravenous Q24H  .  famotidine (PEPCID) IV  20 mg Intravenous Q12H  . fluticasone  1 spray Each Nare Daily  . heparin  5,000 Units Subcutaneous Q8H  . metoprolol succinate  25 mg Oral Daily   Continuous Infusions: . 0.9 % NaCl with KCl 20 mEq / L 100 mL/hr at 09/06/15 0537     LOS: 3 days    Time spent: 25 minutes. Greater than 50% of this time was spent in direct contact with the patient coordinating care.     Chaya Jan, MD [facility] Pager [phone]  If 7PM-7AM, please contact night-coverage [email or web address] Password [email or web address] 09/06/2015, 5:11 PM

## 2015-09-07 MED ORDER — CIPROFLOXACIN HCL 250 MG PO TABS: 250.0000 mg | ORAL_TABLET | Freq: Two times a day (BID) | ORAL | Status: DC

## 2015-09-07 NOTE — Progress Notes (Signed)
D/c instructions and prescription reviewed w/pt, she has no questions at this time.  IV catheter removed without complication, site WNL.  Pt escorted out in [facility] and discharged home w/family.

## 2015-09-07 NOTE — Progress Notes (Signed)
[place] Hospital Day(s): 4.   Post op day(s):  Marland Kitchen   Interval History: Patient seen and examined, no acute events or new complaints overnight. Patient reports tolerating regular diet with flatus and BM, without pain or N/V and denies fever/chills, CP, and SOB. She has been ambulating.  Review of Systems:  Constitutional: denies fever, chills  HEENT: denies cough or congestion  Respiratory: denies any shortness of breath  Cardiovascular: denies chest pain or palpitations  Gastrointestinal: abdominal pain and N/V as per HPI  Musculoskeletal: denies pain, decreased motor or sensation  Neurological: denies HA or vision/hearing changes   Vital signs in last 24 hours: [min-max] current  Temp:  [98.7 F (37.1 C)-98.9 F (37.2 C)] 98.9 F (37.2 C) (07/01 0650) Pulse Rate:  [71-74] 71 (07/01 0650) Resp:  [18-20] 20 (07/01 0650) BP: (118-125)/(65-71) 118/65 mmHg (07/01 0650) SpO2:  [97 %-98 %] 97 % (07/01 0750)     Height: 5\' 1"  (154.9 cm) Weight: 61.5 kg (135 lb 9.3 oz) BMI (Calculated): 25.7   Intake/Output this shift:  Total I/O In: 340 [P.O.:340] Out: -    Intake/Output last 2 shifts:  @IOLAST2SHIFTS @   Physical Exam:  Constitutional: alert, cooperative and no distress  HENT: normocephalic without obvious abnormality  Eyes: PERRL, EOM's grossly intact and symmetric  Neuro: CN II - XII grossly intact and symmetric without deficit  Respiratory: breathing non-labored at rest  Cardiovascular: regular rate and sinus rhythm  Gastrointestinal: soft, non-tender, and non-distended  Musculoskeletal: UE and LE FROM, no edema or wounds, motor and sensation grossly intact, NT   Labs:  CBC:  Lab Results  Component Value Date   WBC 7.5 09/04/2015   RBC 4.67 09/04/2015   BMP:  Lab Results  Component Value Date   GLUCOSE 103* 09/04/2015   CO2 24 09/04/2015   BUN 11 09/04/2015   CREATININE 0.80 09/04/2015   CALCIUM 8.6* 09/04/2015     Assessment/Plan:  50  y.o. female with resolving small bowel obstruction, complicated by pertinent comorbidities including prior bladder reconstruction with small bowel anastomosis, fibromyalgia, irritable bowel syndrome, HTN, and GERD.   - PO diet as tolerated - minimize narcotics (chronic)  - agree with discharge home, can provide surgery office contact info, can follow-up up as needed-  - medical management of co-morbidities as per medical team - ambulation encouraged - DVT prophylaxis  All of the above findings and recommendations were discussed with the patient and her family, and all of her and family's questions were answered to their expressed satisfaction.  Thank you for the opportunity to participate in the care for this patient.   -- Marilynne Drivers Rosana Hoes, [place]: [place] [facility] Office: [phone]

## 2015-09-07 NOTE — Discharge Summary (Signed)
Physician Discharge Summary  [name]:[id number] DOB: [birth year] DOA: 09/03/2015  PCP: Selinda Flavin, MD  Admit date: 09/03/2015 Discharge date: 09/07/2015  Time spent: 45 minutes  Recommendations for Outpatient Follow-up:  -We'll be discharged home today. -Advised to follow-up with primary care provider in 2 weeks.   Discharge Diagnoses:  Principal Problem:   Partial small bowel obstruction (HCC) Active Problems:   Infection of urinary tract   Chronic interstitial cystitis   Essential hypertension   Fibromyalgia   Discharge Condition: Stable and improved  Filed Weights   09/03/15 0224 09/03/15 0706 09/03/15 0730  Weight: 62.596 kg (138 lb) 61.5 kg (135 lb 9.3 oz) 61.5 kg (135 lb 9.3 oz)    History of present illness:  As per Dr. Sherrie Mustache on 6/27: Carol Thompson is a 50 y.o. female with medical history significant for chronic interstitial cystitis, status post bladder reconstruction surgery, status post appendectomy, hypertension, and fibromyalgia, who presents to the ED with a chief complaint of abdominal pain. The pain started yesterday afternoon. It was initially located in the epigastrium, but it has now radiated to pain all over her abdomen. There is some radiation to her lower back. She describes the pain as sharp. It was intermittent, but it has been constant over the past 24 hours. Nothing she does makes the pain worse or better. She has some discomfort with urination. She denies bloody urine or tea-colored urine. She has subjective chills but no fever. She has chronically loose stools. She has had one episode of emesis.  ED Course: In the ED, she was afebrile and hemodynamically stable. CT scan of the abdomen/pelvis revealed low-grade small bowel obstruction with transition point at the anastomosis in the mid to distal ileum. Her lab data were significant for a mildly elevated glucose of 124. Later, her urinalysis revealed many bacteria, positive nitrite, and  6-30 WBCs. She is being admitted for further evaluation and management.  Hospital Course:   Small bowel obstruction -NG removed 6/30. -Tolerating diet without issues.  Escherichia coli UTI  -Continue Cipro for 3 more days on discharge.  Hypertension -Well controlled continue home medicationsalbuterol.   Procedures:  None   Consultations:  Surgery, Dr. Earlene Plater  Discharge Instructions  Discharge Instructions    Diet - low sodium heart healthy    Complete by:  As directed      Increase activity slowly    Complete by:  As directed             Medication List    STOP taking these medications        lidocaine 2 % jelly  Commonly known as:  XYLOCAINE      TAKE these medications        ALPRAZolam 0.5 MG tablet  Commonly known as:  XANAX  Take 0.25 mg by mouth 2 (two) times daily.     AMBIEN 10 MG tablet  Generic drug:  zolpidem  Take 10 mg by mouth at bedtime.     baclofen 20 MG tablet  Commonly known as:  LIORESAL  Take 10-20 mg by mouth 3 (three) times daily.     beclomethasone 40 MCG/ACT inhaler  Commonly known as:  QVAR  Inhale 2 puffs into the lungs 2 (two) times daily.     ciprofloxacin 250 MG tablet  Commonly known as:  CIPRO  Take 1 tablet (250 mg total) by mouth 2 (two) times daily.     diltiazem 30 MG tablet  Commonly known  as:  CARDIZEM  Take 30 mg by mouth 3 (three) times daily.     estradiol 2 MG tablet  Commonly known as:  ESTRACE  Take 2 mg by mouth daily.     fluticasone 50 MCG/ACT nasal spray  Commonly known as:  FLONASE  Place 1 spray into both nostrils daily.     hyoscyamine 0.125 MG tablet  Commonly known as:  LEVSIN, ANASPAZ  Take 0.125 mg by mouth every 4 (four) hours as needed for cramping.     LASIX 20 MG tablet  Generic drug:  furosemide  Take 20 mg by mouth daily as needed for fluid.     metoprolol succinate 100 MG 24 hr tablet  Commonly known as:  TOPROL-XL  Take 100 mg by mouth daily.     Oxycodone HCl 10 MG  Tabs  Take 1 tablet by mouth 4 (four) times daily.     PRILOSEC 20 MG capsule  Generic drug:  omeprazole  Take 20 mg by mouth daily.     promethazine 12.5 MG tablet  Commonly known as:  PHENERGAN  Take 1 tablet by mouth daily as needed for nausea or vomiting.     triamterene-hydrochlorothiazide 37.5-25 MG tablet  Commonly known as:  MAXZIDE-25  Take 1 tablet by mouth daily.     VENTOLIN HFA 108 (90 Base) MCG/ACT inhaler  Generic drug:  albuterol  Inhale 2 puffs into the lungs every 6 (six) hours as needed for wheezing or shortness of breath.       Allergies  Allergen Reactions  . Ciprofloxacin Rash       Follow-up Information    Follow up with Selinda Flavin, MD. Schedule an appointment as soon as possible for a visit in 2 weeks.   Specialty:  Family Medicine   Contact information:   [address] [place] [address] [phone]        The results of significant diagnostics from this hospitalization (including imaging, microbiology, ancillary and laboratory) are listed below for reference.    Significant Diagnostic Studies: Ct Abdomen Pelvis W Contrast  09/03/2015  CLINICAL DATA:  Lower abdominal pain for several days per EXAM: CT ABDOMEN AND PELVIS WITH CONTRAST TECHNIQUE: Multidetector CT imaging of the abdomen and pelvis was performed using the standard protocol following bolus administration of intravenous contrast. CONTRAST:  ISOVUE-300 IOPAMIDOL (ISOVUE-300) INJECTION 61% COMPARISON:  None. FINDINGS: Lower chest:  No significant abnormality Hepatobiliary: There are normal appearances of the liver, gallbladder and bile ducts. Pancreas: Normal Spleen: Normal Adrenals/Urinary Tract: The kidneys and adrenals are normal. Proximal ureters are normal. There is a neobladder, unremarkable. Stomach/Bowel: There is abnormal dilatation of small bowel with transition point at the small bowel anastomosis of the mid to distal ileum. Terminal ileum is normal. Colon is unremarkable.  Stomach is unremarkable. Vascular/Lymphatic: The abdominal aorta is normal in caliber. There is no atherosclerotic calcification. There is no adenopathy in the abdomen or pelvis. Reproductive: Hysterectomy.  No adnexal abnormalities. Other: Small volume ascites. No extraluminal air. Incidental small fat containing umbilical hernia. Musculoskeletal: No significant skeletal lesions. IMPRESSION: Low-grade small bowel obstruction with transition point at the anastomosis in in the mid to distal ileum. Electronically Signed   By: Ellery Plunk M.D.   On: 09/03/2015 05:23   Dg Abd 2 Views  09/05/2015  CLINICAL DATA:  Small bowel obstruction. abd pain EXAM: ABDOMEN - 2 VIEW COMPARISON:  09/03/2015 FINDINGS: Nasogastric tube is in place, tip overlying the level of the distal stomach.  Contrast is identified within a mildly dilated central abdominal small bowel loop. Surgical clips are noted in the right lower quadrant stool is identified within nondilated loops of colon. IMPRESSION: Persistent dilatation of central small bowel loop, containing retained contrast. Findings are consistent with partial small bowel obstruction. Electronically Signed   By: Norva Pavlov M.D.   On: 09/05/2015 09:27    Microbiology: Recent Results (from the past 240 hour(s))  Urine culture     Status: Abnormal   Collection Time: 09/03/15  2:41 AM  Result Value Ref Range Status   Specimen Description URINE, CLEAN CATCH  Final   Special Requests NONE  Final   Culture >=100,000 COLONIES/mL ESCHERICHIA COLI (A)  Final   Report Status 09/05/2015 FINAL  Final   Organism ID, Bacteria ESCHERICHIA COLI (A)  Final      Susceptibility   Escherichia coli - MIC*    AMPICILLIN >=32 RESISTANT Resistant     CEFAZOLIN >=64 RESISTANT Resistant     CEFTRIAXONE 8 SENSITIVE Sensitive     CIPROFLOXACIN <=0.25 SENSITIVE Sensitive     GENTAMICIN <=1 SENSITIVE Sensitive     IMIPENEM <=0.25 SENSITIVE Sensitive     NITROFURANTOIN <=16 SENSITIVE  Sensitive     TRIMETH/SULFA <=20 SENSITIVE Sensitive     AMPICILLIN/SULBACTAM >=32 RESISTANT Resistant     PIP/TAZO <=4 SENSITIVE Sensitive     * >=100,000 COLONIES/mL ESCHERICHIA COLI  MRSA PCR Screening     Status: None   Collection Time: 09/03/15  7:12 AM  Result Value Ref Range Status   MRSA by PCR NEGATIVE NEGATIVE Final    Comment:        The GeneXpert MRSA Assay (FDA approved for NASAL specimens only), is one component of a comprehensive MRSA colonization surveillance program. It is not intended to diagnose MRSA infection nor to guide or monitor treatment for MRSA infections.      Labs: Basic Metabolic Panel:  Recent Labs Lab 09/03/15 0227 09/04/15 0542  NA 138 142  K 4.0 4.0  CL 107 112*  CO2 24 24  GLUCOSE 124* 103*  BUN 15 11  CREATININE 0.90 0.80  CALCIUM 9.3 8.6*   Liver Function Tests:  Recent Labs Lab 09/03/15 0227  AST 20  ALT 15  ALKPHOS 91  BILITOT 0.6  PROT 8.8*  ALBUMIN 4.5    Recent Labs Lab 09/03/15 0227  LIPASE 17   No results for input(s): AMMONIA in the last 168 hours. CBC:  Recent Labs Lab 09/03/15 0227 09/04/15 0542  WBC 10.2 7.5  NEUTROABS 8.3*  --   HGB 14.8 12.8  HCT 44.1 39.7  MCV 83.4 85.0  PLT 256 238   Cardiac Enzymes: No results for input(s): CKTOTAL, CKMB, CKMBINDEX, TROPONINI in the last 168 hours. BNP: BNP (last 3 results) No results for input(s): BNP in the last 8760 hours.  ProBNP (last 3 results) No results for input(s): PROBNP in the last 8760 hours.  CBG: No results for input(s): GLUCAP in the last 168 hours.     SignedChaya Jan  [facility] Pager: [phone] 09/07/2015, 11:59 AM

## 2015-10-03 DIAGNOSIS — R339 Retention of urine, unspecified: Secondary | ICD-10-CM | POA: Diagnosis not present

## 2015-10-20 DIAGNOSIS — D62 Acute posthemorrhagic anemia: Secondary | ICD-10-CM | POA: Insufficient documentation

## 2015-10-21 DIAGNOSIS — R002 Palpitations: Secondary | ICD-10-CM | POA: Diagnosis not present

## 2015-10-21 DIAGNOSIS — I10 Essential (primary) hypertension: Secondary | ICD-10-CM | POA: Diagnosis not present

## 2015-10-21 DIAGNOSIS — R000 Tachycardia, unspecified: Secondary | ICD-10-CM | POA: Diagnosis not present

## 2015-10-21 DIAGNOSIS — N3011 Interstitial cystitis (chronic) with hematuria: Secondary | ICD-10-CM | POA: Diagnosis not present

## 2015-10-21 DIAGNOSIS — D62 Acute posthemorrhagic anemia: Secondary | ICD-10-CM | POA: Diagnosis not present

## 2015-10-21 DIAGNOSIS — M797 Fibromyalgia: Secondary | ICD-10-CM | POA: Diagnosis not present

## 2015-10-23 DIAGNOSIS — N3011 Interstitial cystitis (chronic) with hematuria: Secondary | ICD-10-CM | POA: Diagnosis not present

## 2015-10-23 DIAGNOSIS — G90512 Complex regional pain syndrome I of left upper limb: Secondary | ICD-10-CM | POA: Diagnosis not present

## 2015-10-23 DIAGNOSIS — R002 Palpitations: Secondary | ICD-10-CM | POA: Diagnosis not present

## 2015-10-23 DIAGNOSIS — M797 Fibromyalgia: Secondary | ICD-10-CM | POA: Diagnosis not present

## 2015-10-23 DIAGNOSIS — Z6824 Body mass index (BMI) 24.0-24.9, adult: Secondary | ICD-10-CM | POA: Diagnosis not present

## 2015-10-23 DIAGNOSIS — R000 Tachycardia, unspecified: Secondary | ICD-10-CM | POA: Diagnosis not present

## 2015-10-23 DIAGNOSIS — Z1389 Encounter for screening for other disorder: Secondary | ICD-10-CM | POA: Diagnosis not present

## 2015-12-03 DIAGNOSIS — R339 Retention of urine, unspecified: Secondary | ICD-10-CM | POA: Diagnosis not present

## 2015-12-19 DIAGNOSIS — C539 Malignant neoplasm of cervix uteri, unspecified: Secondary | ICD-10-CM | POA: Diagnosis not present

## 2015-12-19 DIAGNOSIS — Z79899 Other long term (current) drug therapy: Secondary | ICD-10-CM | POA: Diagnosis not present

## 2015-12-19 DIAGNOSIS — Z79891 Long term (current) use of opiate analgesic: Secondary | ICD-10-CM | POA: Diagnosis not present

## 2015-12-19 DIAGNOSIS — M47816 Spondylosis without myelopathy or radiculopathy, lumbar region: Secondary | ICD-10-CM | POA: Diagnosis not present

## 2015-12-19 DIAGNOSIS — M797 Fibromyalgia: Secondary | ICD-10-CM | POA: Diagnosis not present

## 2015-12-19 DIAGNOSIS — M47812 Spondylosis without myelopathy or radiculopathy, cervical region: Secondary | ICD-10-CM | POA: Diagnosis not present

## 2015-12-19 DIAGNOSIS — G9050 Complex regional pain syndrome I, unspecified: Secondary | ICD-10-CM | POA: Diagnosis not present

## 2015-12-19 DIAGNOSIS — G8929 Other chronic pain: Secondary | ICD-10-CM | POA: Diagnosis not present

## 2015-12-26 IMAGING — CR DG FINGER MIDDLE 2+V*L*
1 series · 1 of 1 positions shown · non-contrast
Comparison: None.

CLINICAL DATA: Laceration to the left third finger.

EXAM:
LEFT MIDDLE FINGER 2+V

[view not recorded]
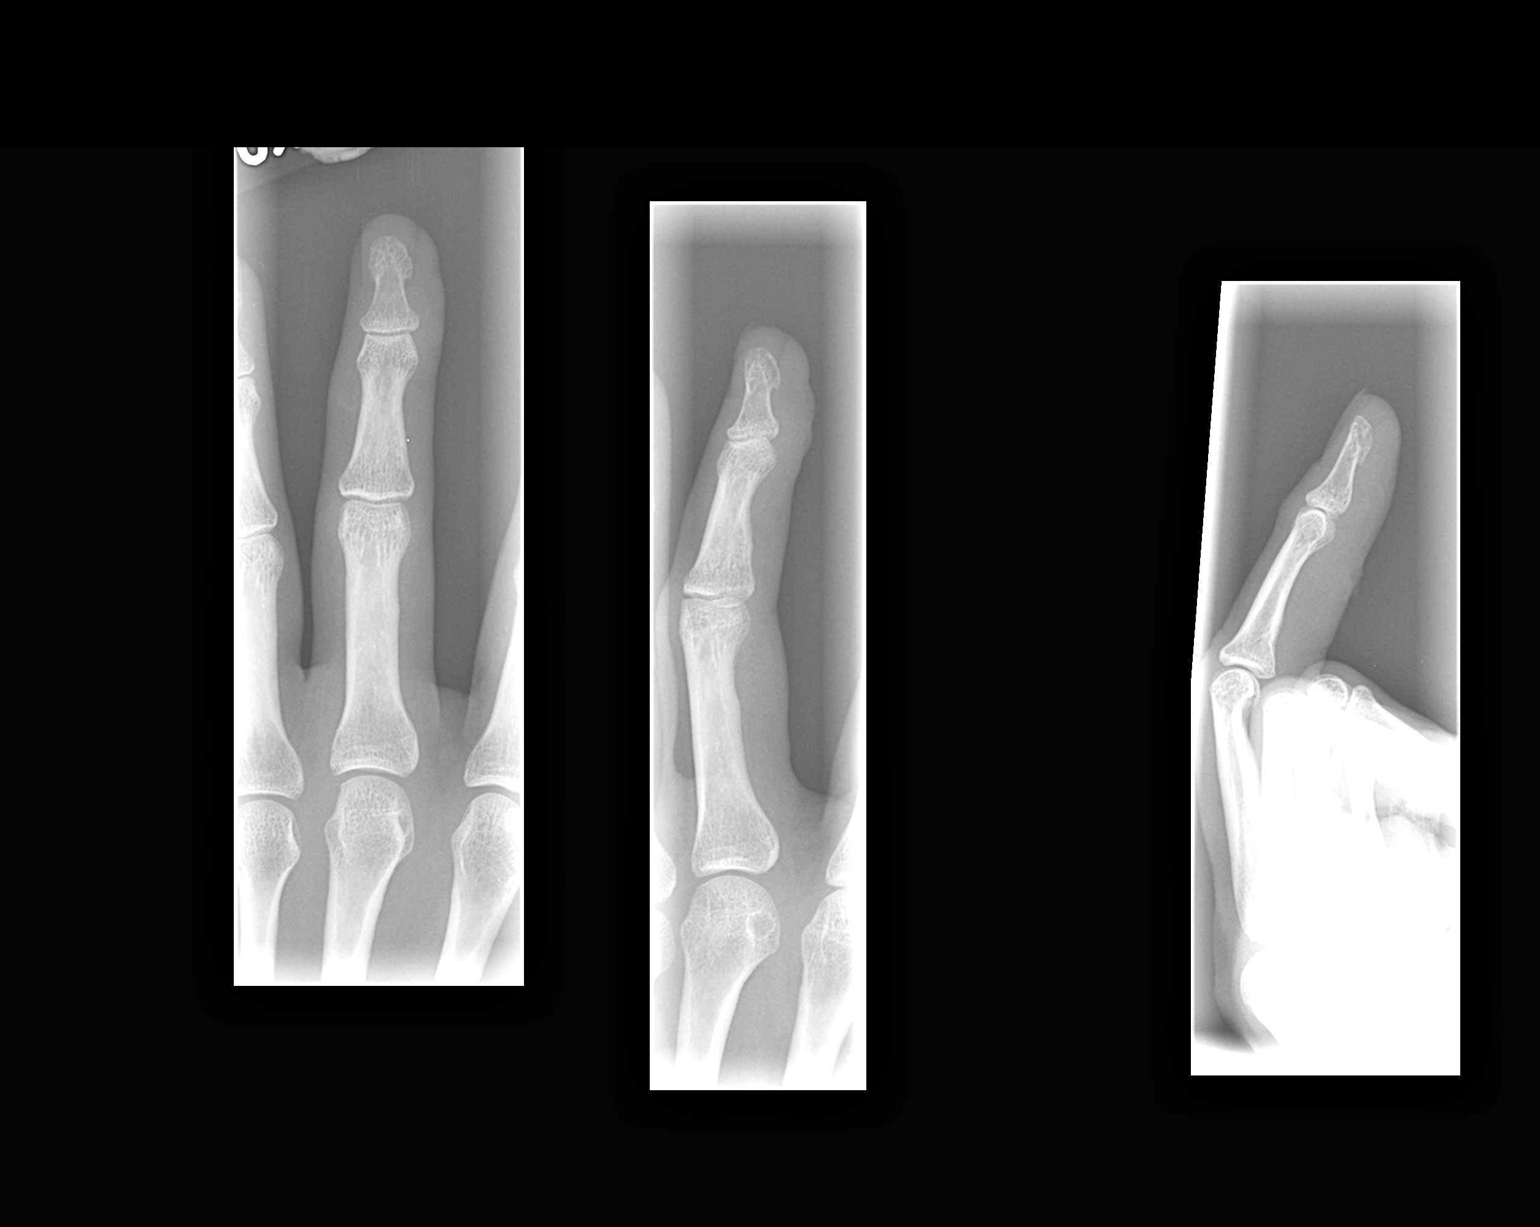

[1 of 1 positions shown; findings below may reference images not displayed]

FINDINGS: There is soft tissue disruption at the third digit, without evidence
of osseous disruption. Visualized joint spaces are preserved. No
radiopaque foreign bodies are seen.
IMPRESSION: No evidence of osseous disruption. No radiopaque foreign bodies
seen.

## 2016-01-02 DIAGNOSIS — R339 Retention of urine, unspecified: Secondary | ICD-10-CM | POA: Diagnosis not present

## 2016-01-09 DIAGNOSIS — M797 Fibromyalgia: Secondary | ICD-10-CM | POA: Diagnosis not present

## 2016-01-09 DIAGNOSIS — G90512 Complex regional pain syndrome I of left upper limb: Secondary | ICD-10-CM | POA: Diagnosis not present

## 2016-01-09 DIAGNOSIS — N3011 Interstitial cystitis (chronic) with hematuria: Secondary | ICD-10-CM | POA: Diagnosis not present

## 2016-01-09 DIAGNOSIS — R000 Tachycardia, unspecified: Secondary | ICD-10-CM | POA: Diagnosis not present

## 2016-01-09 DIAGNOSIS — I10 Essential (primary) hypertension: Secondary | ICD-10-CM | POA: Diagnosis not present

## 2016-01-09 DIAGNOSIS — Z23 Encounter for immunization: Secondary | ICD-10-CM | POA: Diagnosis not present

## 2016-01-09 DIAGNOSIS — Z6824 Body mass index (BMI) 24.0-24.9, adult: Secondary | ICD-10-CM | POA: Diagnosis not present

## 2016-01-14 DIAGNOSIS — F329 Major depressive disorder, single episode, unspecified: Secondary | ICD-10-CM | POA: Diagnosis not present

## 2016-01-14 DIAGNOSIS — G9050 Complex regional pain syndrome I, unspecified: Secondary | ICD-10-CM | POA: Diagnosis not present

## 2016-01-14 DIAGNOSIS — Z79891 Long term (current) use of opiate analgesic: Secondary | ICD-10-CM | POA: Diagnosis not present

## 2016-01-14 DIAGNOSIS — M47812 Spondylosis without myelopathy or radiculopathy, cervical region: Secondary | ICD-10-CM | POA: Diagnosis not present

## 2016-01-14 DIAGNOSIS — Z8541 Personal history of malignant neoplasm of cervix uteri: Secondary | ICD-10-CM | POA: Diagnosis not present

## 2016-01-14 DIAGNOSIS — G894 Chronic pain syndrome: Secondary | ICD-10-CM | POA: Diagnosis not present

## 2016-02-03 DIAGNOSIS — R339 Retention of urine, unspecified: Secondary | ICD-10-CM | POA: Diagnosis not present

## 2016-02-11 DIAGNOSIS — Z79891 Long term (current) use of opiate analgesic: Secondary | ICD-10-CM | POA: Diagnosis not present

## 2016-02-11 DIAGNOSIS — Z8541 Personal history of malignant neoplasm of cervix uteri: Secondary | ICD-10-CM | POA: Diagnosis not present

## 2016-02-11 DIAGNOSIS — G9050 Complex regional pain syndrome I, unspecified: Secondary | ICD-10-CM | POA: Diagnosis not present

## 2016-02-11 DIAGNOSIS — M47812 Spondylosis without myelopathy or radiculopathy, cervical region: Secondary | ICD-10-CM | POA: Diagnosis not present

## 2016-02-11 DIAGNOSIS — G894 Chronic pain syndrome: Secondary | ICD-10-CM | POA: Diagnosis not present

## 2016-03-04 DIAGNOSIS — R339 Retention of urine, unspecified: Secondary | ICD-10-CM | POA: Diagnosis not present

## 2016-03-12 DIAGNOSIS — M47812 Spondylosis without myelopathy or radiculopathy, cervical region: Secondary | ICD-10-CM | POA: Diagnosis not present

## 2016-03-12 DIAGNOSIS — Z79891 Long term (current) use of opiate analgesic: Secondary | ICD-10-CM | POA: Diagnosis not present

## 2016-03-12 DIAGNOSIS — G894 Chronic pain syndrome: Secondary | ICD-10-CM | POA: Diagnosis not present

## 2016-03-12 DIAGNOSIS — Z8541 Personal history of malignant neoplasm of cervix uteri: Secondary | ICD-10-CM | POA: Diagnosis not present

## 2016-03-12 DIAGNOSIS — G9050 Complex regional pain syndrome I, unspecified: Secondary | ICD-10-CM | POA: Diagnosis not present

## 2016-04-03 DIAGNOSIS — R339 Retention of urine, unspecified: Secondary | ICD-10-CM | POA: Diagnosis not present

## 2016-04-10 ENCOUNTER — Encounter: Payer: Self-pay | Admitting: Internal Medicine

## 2016-05-04 DIAGNOSIS — R339 Retention of urine, unspecified: Secondary | ICD-10-CM | POA: Diagnosis not present

## 2016-05-05 DIAGNOSIS — Z6824 Body mass index (BMI) 24.0-24.9, adult: Secondary | ICD-10-CM | POA: Insufficient documentation

## 2016-05-06 DIAGNOSIS — M797 Fibromyalgia: Secondary | ICD-10-CM | POA: Diagnosis not present

## 2016-05-06 DIAGNOSIS — Z6824 Body mass index (BMI) 24.0-24.9, adult: Secondary | ICD-10-CM | POA: Diagnosis not present

## 2016-05-06 DIAGNOSIS — I10 Essential (primary) hypertension: Secondary | ICD-10-CM | POA: Diagnosis not present

## 2016-05-06 DIAGNOSIS — G9050 Complex regional pain syndrome I, unspecified: Secondary | ICD-10-CM | POA: Diagnosis not present

## 2016-05-06 DIAGNOSIS — R000 Tachycardia, unspecified: Secondary | ICD-10-CM | POA: Diagnosis not present

## 2016-05-06 DIAGNOSIS — G90512 Complex regional pain syndrome I of left upper limb: Secondary | ICD-10-CM | POA: Diagnosis not present

## 2016-05-18 DIAGNOSIS — J069 Acute upper respiratory infection, unspecified: Secondary | ICD-10-CM | POA: Insufficient documentation

## 2016-05-19 DIAGNOSIS — J069 Acute upper respiratory infection, unspecified: Secondary | ICD-10-CM | POA: Diagnosis not present

## 2016-05-19 DIAGNOSIS — N390 Urinary tract infection, site not specified: Secondary | ICD-10-CM | POA: Diagnosis not present

## 2016-06-09 DIAGNOSIS — Z79891 Long term (current) use of opiate analgesic: Secondary | ICD-10-CM | POA: Diagnosis not present

## 2016-06-09 DIAGNOSIS — M797 Fibromyalgia: Secondary | ICD-10-CM | POA: Diagnosis not present

## 2016-06-09 DIAGNOSIS — N3010 Interstitial cystitis (chronic) without hematuria: Secondary | ICD-10-CM | POA: Diagnosis not present

## 2016-06-09 DIAGNOSIS — G894 Chronic pain syndrome: Secondary | ICD-10-CM | POA: Diagnosis not present

## 2016-06-09 DIAGNOSIS — R102 Pelvic and perineal pain: Secondary | ICD-10-CM | POA: Diagnosis not present

## 2016-06-09 DIAGNOSIS — Z8541 Personal history of malignant neoplasm of cervix uteri: Secondary | ICD-10-CM | POA: Diagnosis not present

## 2016-06-09 DIAGNOSIS — G9050 Complex regional pain syndrome I, unspecified: Secondary | ICD-10-CM | POA: Diagnosis not present

## 2016-07-03 DIAGNOSIS — R339 Retention of urine, unspecified: Secondary | ICD-10-CM | POA: Diagnosis not present

## 2016-08-11 DIAGNOSIS — G894 Chronic pain syndrome: Secondary | ICD-10-CM | POA: Diagnosis not present

## 2016-08-11 DIAGNOSIS — R102 Pelvic and perineal pain: Secondary | ICD-10-CM | POA: Diagnosis not present

## 2016-08-11 DIAGNOSIS — G9050 Complex regional pain syndrome I, unspecified: Secondary | ICD-10-CM | POA: Diagnosis not present

## 2016-08-11 DIAGNOSIS — M797 Fibromyalgia: Secondary | ICD-10-CM | POA: Diagnosis not present

## 2016-08-11 DIAGNOSIS — Z79891 Long term (current) use of opiate analgesic: Secondary | ICD-10-CM | POA: Diagnosis not present

## 2016-08-27 DIAGNOSIS — R339 Retention of urine, unspecified: Secondary | ICD-10-CM | POA: Diagnosis not present

## 2016-09-10 DIAGNOSIS — R102 Pelvic and perineal pain: Secondary | ICD-10-CM | POA: Diagnosis not present

## 2016-09-10 DIAGNOSIS — M47816 Spondylosis without myelopathy or radiculopathy, lumbar region: Secondary | ICD-10-CM | POA: Diagnosis not present

## 2016-09-10 DIAGNOSIS — M47812 Spondylosis without myelopathy or radiculopathy, cervical region: Secondary | ICD-10-CM | POA: Diagnosis not present

## 2016-09-10 DIAGNOSIS — G894 Chronic pain syndrome: Secondary | ICD-10-CM | POA: Diagnosis not present

## 2016-09-10 DIAGNOSIS — Z79891 Long term (current) use of opiate analgesic: Secondary | ICD-10-CM | POA: Diagnosis not present

## 2016-09-10 DIAGNOSIS — M797 Fibromyalgia: Secondary | ICD-10-CM | POA: Diagnosis not present

## 2016-09-19 DIAGNOSIS — Z6824 Body mass index (BMI) 24.0-24.9, adult: Secondary | ICD-10-CM | POA: Diagnosis not present

## 2016-09-19 DIAGNOSIS — N10 Acute tubulo-interstitial nephritis: Secondary | ICD-10-CM | POA: Insufficient documentation

## 2016-09-24 DIAGNOSIS — I10 Essential (primary) hypertension: Secondary | ICD-10-CM | POA: Diagnosis not present

## 2016-09-24 DIAGNOSIS — J45909 Unspecified asthma, uncomplicated: Secondary | ICD-10-CM | POA: Diagnosis not present

## 2016-09-24 DIAGNOSIS — N390 Urinary tract infection, site not specified: Secondary | ICD-10-CM | POA: Diagnosis not present

## 2016-09-24 DIAGNOSIS — M797 Fibromyalgia: Secondary | ICD-10-CM | POA: Diagnosis not present

## 2016-09-24 DIAGNOSIS — R110 Nausea: Secondary | ICD-10-CM | POA: Diagnosis not present

## 2016-09-24 DIAGNOSIS — Z79899 Other long term (current) drug therapy: Secondary | ICD-10-CM | POA: Diagnosis not present

## 2016-09-24 DIAGNOSIS — Z8541 Personal history of malignant neoplasm of cervix uteri: Secondary | ICD-10-CM | POA: Diagnosis not present

## 2016-09-24 DIAGNOSIS — R1030 Lower abdominal pain, unspecified: Secondary | ICD-10-CM | POA: Diagnosis not present

## 2016-09-24 DIAGNOSIS — R109 Unspecified abdominal pain: Secondary | ICD-10-CM | POA: Diagnosis not present

## 2016-09-24 DIAGNOSIS — T83511A Infection and inflammatory reaction due to indwelling urethral catheter, initial encounter: Secondary | ICD-10-CM | POA: Diagnosis not present

## 2016-09-28 DIAGNOSIS — R339 Retention of urine, unspecified: Secondary | ICD-10-CM | POA: Diagnosis not present

## 2016-10-28 DIAGNOSIS — R339 Retention of urine, unspecified: Secondary | ICD-10-CM | POA: Diagnosis not present

## 2016-11-12 DIAGNOSIS — R000 Tachycardia, unspecified: Secondary | ICD-10-CM | POA: Diagnosis not present

## 2016-11-12 DIAGNOSIS — M797 Fibromyalgia: Secondary | ICD-10-CM | POA: Diagnosis not present

## 2016-11-12 DIAGNOSIS — I10 Essential (primary) hypertension: Secondary | ICD-10-CM | POA: Diagnosis not present

## 2016-11-12 DIAGNOSIS — G9050 Complex regional pain syndrome I, unspecified: Secondary | ICD-10-CM | POA: Diagnosis not present

## 2016-11-12 DIAGNOSIS — G90512 Complex regional pain syndrome I of left upper limb: Secondary | ICD-10-CM | POA: Diagnosis not present

## 2016-11-12 DIAGNOSIS — Z6824 Body mass index (BMI) 24.0-24.9, adult: Secondary | ICD-10-CM | POA: Diagnosis not present

## 2016-11-18 DIAGNOSIS — M47816 Spondylosis without myelopathy or radiculopathy, lumbar region: Secondary | ICD-10-CM | POA: Diagnosis not present

## 2016-11-18 DIAGNOSIS — G90512 Complex regional pain syndrome I of left upper limb: Secondary | ICD-10-CM | POA: Diagnosis not present

## 2016-11-18 DIAGNOSIS — Z79891 Long term (current) use of opiate analgesic: Secondary | ICD-10-CM | POA: Diagnosis not present

## 2016-11-18 DIAGNOSIS — M47812 Spondylosis without myelopathy or radiculopathy, cervical region: Secondary | ICD-10-CM | POA: Diagnosis not present

## 2016-11-18 DIAGNOSIS — G894 Chronic pain syndrome: Secondary | ICD-10-CM | POA: Diagnosis not present

## 2016-11-18 DIAGNOSIS — M797 Fibromyalgia: Secondary | ICD-10-CM | POA: Diagnosis not present

## 2016-11-18 DIAGNOSIS — M2550 Pain in unspecified joint: Secondary | ICD-10-CM | POA: Diagnosis not present

## 2016-12-16 DIAGNOSIS — G90512 Complex regional pain syndrome I of left upper limb: Secondary | ICD-10-CM | POA: Diagnosis not present

## 2016-12-16 DIAGNOSIS — Z79891 Long term (current) use of opiate analgesic: Secondary | ICD-10-CM | POA: Diagnosis not present

## 2016-12-16 DIAGNOSIS — M47816 Spondylosis without myelopathy or radiculopathy, lumbar region: Secondary | ICD-10-CM | POA: Diagnosis not present

## 2016-12-16 DIAGNOSIS — M797 Fibromyalgia: Secondary | ICD-10-CM | POA: Diagnosis not present

## 2016-12-16 DIAGNOSIS — G894 Chronic pain syndrome: Secondary | ICD-10-CM | POA: Diagnosis not present

## 2017-01-21 DIAGNOSIS — Z1231 Encounter for screening mammogram for malignant neoplasm of breast: Secondary | ICD-10-CM | POA: Diagnosis not present

## 2017-02-24 DIAGNOSIS — M47816 Spondylosis without myelopathy or radiculopathy, lumbar region: Secondary | ICD-10-CM | POA: Diagnosis not present

## 2017-02-24 DIAGNOSIS — M797 Fibromyalgia: Secondary | ICD-10-CM | POA: Diagnosis not present

## 2017-02-24 DIAGNOSIS — M2550 Pain in unspecified joint: Secondary | ICD-10-CM | POA: Insufficient documentation

## 2017-02-24 DIAGNOSIS — M47812 Spondylosis without myelopathy or radiculopathy, cervical region: Secondary | ICD-10-CM | POA: Diagnosis not present

## 2017-02-24 DIAGNOSIS — G894 Chronic pain syndrome: Secondary | ICD-10-CM | POA: Diagnosis not present

## 2017-02-24 DIAGNOSIS — G90512 Complex regional pain syndrome I of left upper limb: Secondary | ICD-10-CM | POA: Diagnosis not present

## 2017-02-24 DIAGNOSIS — Z79891 Long term (current) use of opiate analgesic: Secondary | ICD-10-CM | POA: Diagnosis not present

## 2017-04-07 DIAGNOSIS — M797 Fibromyalgia: Secondary | ICD-10-CM | POA: Diagnosis not present

## 2017-04-07 DIAGNOSIS — G894 Chronic pain syndrome: Secondary | ICD-10-CM | POA: Diagnosis not present

## 2017-04-07 DIAGNOSIS — M47816 Spondylosis without myelopathy or radiculopathy, lumbar region: Secondary | ICD-10-CM | POA: Diagnosis not present

## 2017-04-07 DIAGNOSIS — M47812 Spondylosis without myelopathy or radiculopathy, cervical region: Secondary | ICD-10-CM | POA: Diagnosis not present

## 2017-04-07 DIAGNOSIS — G90512 Complex regional pain syndrome I of left upper limb: Secondary | ICD-10-CM | POA: Diagnosis not present

## 2017-05-05 DIAGNOSIS — M797 Fibromyalgia: Secondary | ICD-10-CM | POA: Diagnosis not present

## 2017-05-05 DIAGNOSIS — G90512 Complex regional pain syndrome I of left upper limb: Secondary | ICD-10-CM | POA: Diagnosis not present

## 2017-05-05 DIAGNOSIS — G894 Chronic pain syndrome: Secondary | ICD-10-CM | POA: Diagnosis not present

## 2017-05-07 DIAGNOSIS — E559 Vitamin D deficiency, unspecified: Secondary | ICD-10-CM | POA: Insufficient documentation

## 2017-05-14 DIAGNOSIS — G9050 Complex regional pain syndrome I, unspecified: Secondary | ICD-10-CM | POA: Diagnosis not present

## 2017-05-14 DIAGNOSIS — R000 Tachycardia, unspecified: Secondary | ICD-10-CM | POA: Diagnosis not present

## 2017-05-14 DIAGNOSIS — M797 Fibromyalgia: Secondary | ICD-10-CM | POA: Diagnosis not present

## 2017-05-14 DIAGNOSIS — D519 Vitamin B12 deficiency anemia, unspecified: Secondary | ICD-10-CM | POA: Diagnosis not present

## 2017-05-14 DIAGNOSIS — I10 Essential (primary) hypertension: Secondary | ICD-10-CM | POA: Diagnosis not present

## 2017-05-17 DIAGNOSIS — N10 Acute tubulo-interstitial nephritis: Secondary | ICD-10-CM | POA: Diagnosis not present

## 2017-05-17 DIAGNOSIS — Z681 Body mass index (BMI) 19 or less, adult: Secondary | ICD-10-CM | POA: Insufficient documentation

## 2017-05-17 DIAGNOSIS — R350 Frequency of micturition: Secondary | ICD-10-CM | POA: Diagnosis not present

## 2017-05-17 DIAGNOSIS — Z6826 Body mass index (BMI) 26.0-26.9, adult: Secondary | ICD-10-CM | POA: Insufficient documentation

## 2017-05-17 DIAGNOSIS — M797 Fibromyalgia: Secondary | ICD-10-CM | POA: Diagnosis not present

## 2017-05-17 DIAGNOSIS — G90512 Complex regional pain syndrome I of left upper limb: Secondary | ICD-10-CM | POA: Diagnosis not present

## 2017-05-27 DIAGNOSIS — R339 Retention of urine, unspecified: Secondary | ICD-10-CM | POA: Diagnosis not present

## 2017-06-07 DIAGNOSIS — R300 Dysuria: Secondary | ICD-10-CM | POA: Diagnosis not present

## 2017-06-07 DIAGNOSIS — Z8619 Personal history of other infectious and parasitic diseases: Secondary | ICD-10-CM | POA: Diagnosis not present

## 2017-06-07 DIAGNOSIS — N898 Other specified noninflammatory disorders of vagina: Secondary | ICD-10-CM | POA: Diagnosis not present

## 2017-06-07 DIAGNOSIS — Z01411 Encounter for gynecological examination (general) (routine) with abnormal findings: Secondary | ICD-10-CM | POA: Diagnosis not present

## 2017-06-07 DIAGNOSIS — R109 Unspecified abdominal pain: Secondary | ICD-10-CM | POA: Diagnosis not present

## 2017-06-07 DIAGNOSIS — Z124 Encounter for screening for malignant neoplasm of cervix: Secondary | ICD-10-CM | POA: Diagnosis not present

## 2017-06-21 DIAGNOSIS — N949 Unspecified condition associated with female genital organs and menstrual cycle: Secondary | ICD-10-CM | POA: Diagnosis not present

## 2017-06-21 DIAGNOSIS — L814 Other melanin hyperpigmentation: Secondary | ICD-10-CM | POA: Diagnosis not present

## 2017-07-08 DIAGNOSIS — R339 Retention of urine, unspecified: Secondary | ICD-10-CM | POA: Diagnosis not present

## 2017-08-03 DIAGNOSIS — Z79891 Long term (current) use of opiate analgesic: Secondary | ICD-10-CM | POA: Diagnosis not present

## 2017-08-03 DIAGNOSIS — M797 Fibromyalgia: Secondary | ICD-10-CM | POA: Diagnosis not present

## 2017-08-03 DIAGNOSIS — G894 Chronic pain syndrome: Secondary | ICD-10-CM | POA: Diagnosis not present

## 2017-08-03 DIAGNOSIS — G90512 Complex regional pain syndrome I of left upper limb: Secondary | ICD-10-CM | POA: Diagnosis not present

## 2017-08-09 DIAGNOSIS — R339 Retention of urine, unspecified: Secondary | ICD-10-CM | POA: Diagnosis not present

## 2017-09-13 DIAGNOSIS — R339 Retention of urine, unspecified: Secondary | ICD-10-CM | POA: Diagnosis not present

## 2017-10-28 DIAGNOSIS — R339 Retention of urine, unspecified: Secondary | ICD-10-CM | POA: Diagnosis not present

## 2017-11-04 DIAGNOSIS — M47816 Spondylosis without myelopathy or radiculopathy, lumbar region: Secondary | ICD-10-CM | POA: Diagnosis not present

## 2017-11-04 DIAGNOSIS — G90512 Complex regional pain syndrome I of left upper limb: Secondary | ICD-10-CM | POA: Diagnosis not present

## 2017-11-04 DIAGNOSIS — M7918 Myalgia, other site: Secondary | ICD-10-CM | POA: Diagnosis not present

## 2017-11-04 DIAGNOSIS — G894 Chronic pain syndrome: Secondary | ICD-10-CM | POA: Diagnosis not present

## 2017-11-04 DIAGNOSIS — M797 Fibromyalgia: Secondary | ICD-10-CM | POA: Diagnosis not present

## 2017-11-24 DIAGNOSIS — Z1389 Encounter for screening for other disorder: Secondary | ICD-10-CM | POA: Diagnosis not present

## 2017-11-24 DIAGNOSIS — G90512 Complex regional pain syndrome I of left upper limb: Secondary | ICD-10-CM | POA: Diagnosis not present

## 2017-11-24 DIAGNOSIS — Z6825 Body mass index (BMI) 25.0-25.9, adult: Secondary | ICD-10-CM | POA: Diagnosis not present

## 2017-11-24 DIAGNOSIS — Z1331 Encounter for screening for depression: Secondary | ICD-10-CM | POA: Diagnosis not present

## 2017-11-24 DIAGNOSIS — M797 Fibromyalgia: Secondary | ICD-10-CM | POA: Diagnosis not present

## 2017-11-24 DIAGNOSIS — R350 Frequency of micturition: Secondary | ICD-10-CM | POA: Diagnosis not present

## 2017-11-24 DIAGNOSIS — N10 Acute tubulo-interstitial nephritis: Secondary | ICD-10-CM | POA: Diagnosis not present

## 2017-11-29 DIAGNOSIS — R339 Retention of urine, unspecified: Secondary | ICD-10-CM | POA: Diagnosis not present

## 2017-12-13 DIAGNOSIS — R300 Dysuria: Secondary | ICD-10-CM | POA: Diagnosis not present

## 2017-12-29 DIAGNOSIS — R339 Retention of urine, unspecified: Secondary | ICD-10-CM | POA: Diagnosis not present

## 2018-01-04 ENCOUNTER — Encounter: Payer: Self-pay | Admitting: Cardiology

## 2018-01-04 ENCOUNTER — Ambulatory Visit: Payer: Medicare PPO | Admitting: Cardiology

## 2018-01-04 VITALS — BP 138/88 | HR 100 | Ht 61.0 in | Wt 146.0 lb

## 2018-01-04 DIAGNOSIS — R002 Palpitations: Secondary | ICD-10-CM | POA: Diagnosis not present

## 2018-01-04 DIAGNOSIS — I10 Essential (primary) hypertension: Secondary | ICD-10-CM

## 2018-01-04 NOTE — Patient Instructions (Signed)
Medication Instructions:  Your physician recommends that you continue on your current medications as directed. Please refer to the Current Medication list given to you today.  If you need a refill on your cardiac medications before your next appointment, please call your pharmacy.   Lab work: NONE If you have labs (blood work) drawn today and your tests are completely normal, you will receive your results only by: Marland Kitchen MyChart Message (if you have MyChart) OR . A paper copy in the mail If you have any lab test that is abnormal or we need to change your treatment, we will call you to review the results.  Testing/Procedures: Your physician has recommended that you wear an event monitor for 30 days. Event monitors are medical devices that record the heart's electrical activity. Doctors most often [place] these monitors to diagnose arrhythmias. Arrhythmias are problems with the speed or rhythm of the heartbeat. The monitor is a small, portable device. You can wear one while you do your normal daily activities. This is usually used to diagnose what is causing palpitations/syncope (passing out).    Follow-Up: 6 weeks with Dr.McDowell .   Any Other Special Instructions Will Be Listed Below (If Applicable). NONE

## 2018-01-04 NOTE — Progress Notes (Signed)
Cardiology Office Note  Date: 01/04/2018   ID: Cocoa, Leick [birth year], MRN [id number]  PCP: Selinda Flavin, MD  Consulting Cardiologist: Nona Dell, MD   Chief Complaint  Patient presents with  . Palpitations    History of Present Illness: Carol Thompson is a 52 y.o. female referred for cardiology consultation by Dr. Dimas Aguas for the evaluation of palpitations.  This is been a long-term recurring symptom.  She states that she has been trying to wean off of Xanax, also Ambien.  She describes a feeling as if her heart rate speeds up more than normal with some activities, also feels a rapid sense of palpitations at times without obvious trigger.  Anxiety and stress does seem to be a component however.  She was evaluated in our cardiology practice previously, last seen in 2013 for the assessment of chest discomfort and palpitations.  At that time a 48-hour Holter monitor revealed only rare PACs.  Lexiscan Cardiolite in February 2013 was also low risk without evidence of ischemia.  Subsequent 7-day event recorder showed sinus rhythm throughout as well.  Echocardiogram revealed LVEF 60 to 65% without significant valvular abnormalities.  I personally reviewed her ECG today which shows sinus rhythm with borderline low voltage, poor R wave progression, and Q wave in lead III.  She is on Toprol-XL 100 mg daily, also Maxide and Cardizem 30 mg once daily.  She had been taking Cardizem 3 times a day but cut it back due to low blood pressures.  She has not had syncope with her palpitations.  Past Medical History:  Diagnosis Date  . Acid reflux   . Asthma   . Chronic fatigue   . Chronic pain   . Cystitis, interstitial   . Depression   . Fibromyalgia   . Hypertension   . IBS (irritable bowel syndrome)   . Internal hemorrhoids    Colonoscopy 11/11 - Dr. Karilyn Cota  . Migraine   . Pelvic floor dysfunction   . Sleep deprivation   . Vulvodynia     Past Surgical History:    Procedure Laterality Date  . ABDOMINAL HYSTERECTOMY    . APPENDECTOMY    . BLADDER SURGERY      Current Outpatient Medications  Medication Sig Dispense Refill  . albuterol (VENTOLIN HFA) 108 (90 BASE) MCG/ACT inhaler Inhale 2 puffs into the lungs every 6 (six) hours as needed for wheezing or shortness of breath.     . baclofen (LIORESAL) 20 MG tablet Take 10-20 mg by mouth 3 (three) times daily.     . beclomethasone (QVAR) 40 MCG/ACT inhaler Inhale 2 puffs into the lungs 2 (two) times daily.    Marland Kitchen diltiazem (CARDIZEM) 30 MG tablet Take 30 mg by mouth 3 (three) times daily.    Marland Kitchen estradiol (ESTRACE) 2 MG tablet Take 2 mg by mouth daily.      . fluticasone (FLONASE) 50 MCG/ACT nasal spray Place 1 spray into both nostrils daily.    . hyoscyamine (LEVSIN, ANASPAZ) 0.125 MG tablet Take 0.125 mg by mouth every 4 (four) hours as needed for cramping.    . lidocaine (XYLOCAINE) 2 % jelly Place 1 application into the urethra as needed.    . Melatonin 5 MG CHEW Chew 5 mg by mouth at bedtime.    . metoprolol (TOPROL-XL) 100 MG 24 hr tablet Take 100 mg by mouth daily.      Marland Kitchen omeprazole (PRILOSEC) 20 MG capsule Take 20 mg by mouth daily.     Marland Kitchen  Oxycodone HCl 10 MG TABS Take 1 tablet by mouth 4 (four) times daily.  0  . promethazine (PHENERGAN) 12.5 MG tablet Take 1 tablet by mouth daily as needed for nausea or vomiting.     . triamterene-hydrochlorothiazide (MAXZIDE-25) 37.5-25 MG per tablet Take 1 tablet by mouth daily.     No current facility-administered medications for this visit.    Allergies:  Ciprofloxacin   Social History: The patient  reports that she has never smoked. She has never used smokeless tobacco. She reports that she does not drink alcohol or use drugs.   ROS:  Please see the history of present illness. Otherwise, complete review of systems is positive for chronic pain.  All other systems are reviewed and negative.   Physical Exam: VS:  BP 138/88 (BP Location: Right Arm)   Pulse  100   Ht 5\' 1"  (1.549 m)   Wt 146 lb (66.2 kg)   SpO2 98%   BMI 27.59 kg/m , BMI Body mass index is 27.59 kg/m.  Wt Readings from Last 3 Encounters:  01/04/18 146 lb (66.2 kg)  09/03/15 135 lb 9.3 oz (61.5 kg)  03/02/14 142 lb (64.4 kg)    General: Patient appears comfortable at rest. HEENT: Conjunctiva and lids normal, oropharynx clear. Neck: Supple, no elevated JVP or carotid bruits, no thyromegaly. Lungs: Clear to auscultation, nonlabored breathing at rest. Cardiac: Regular rate and rhythm, no S3 or significant systolic murmur, no pericardial rub. Abdomen: Soft, nontender, bowel sounds present, no guarding or rebound. Extremities: No pitting edema, distal pulses 2+. Skin: Warm and dry. Musculoskeletal: No kyphosis. Neuropsychiatric: Alert and oriented x3, affect grossly appropriate.  ECG: I personally reviewed the tracing from 3/26 2013 which showed sinus rhythm.  Recent Labwork:  June 2017: Hemoglobin 12.8, platelets 238, potassium 4.0, BUN 11, creatinine 0.8  Other Studies Reviewed Today:  Echocardiogram 06/18/2011: Study Conclusions  - Left ventricle: Wall thickness was at the upper limits of normal. Systolic function was normal. The estimated ejection fraction was in the range of 60% to 65%. Wall motion was normal; there were no regional wall motion abnormalities. Left ventricular diastolic function parameters were normal. - Mitral valve: Trivial regurgitation. - Tricuspid valve: Trivial regurgitation. - Pericardium, extracardiac: There was no pericardial effusion.  Assessment and Plan:  1.  Long-standing recurrent palpitations.  She currently describes rapid heart rate at least 4-5 times a month.  She has not had any previous documented cardiac arrhythmias, did have PACs by one prior monitor.  She states that symptoms are more frequent recently as she has been cutting back on Xanax and Ambien as well.  She is already on high-dose beta-blocker.  Plan  to obtain a 30-day event recorder to further investigate prior to making any adjustments in medications.  2.  Essential hypertension.  Current medications include Toprol-XL, Maxide, and short acting Cardizem.  May be able to change to Cardizem CD plus Toprol-XL and drop Maxide.  Current medicines were reviewed with the patient today.   Orders Placed This Encounter  Procedures  . Cardiac event monitor  . EKG 12-Lead    Disposition: Follow-up in 6 weeks.  Signed, Jonelle Sidle, MD, [facility] 01/04/2018 1:51 PM    [facility] at [facility] [address], [place], [place] [address] Phone: [phone]; Fax: [phone]

## 2018-01-06 ENCOUNTER — Ambulatory Visit (INDEPENDENT_AMBULATORY_CARE_PROVIDER_SITE_OTHER): Payer: Medicare PPO

## 2018-01-06 DIAGNOSIS — R002 Palpitations: Secondary | ICD-10-CM | POA: Diagnosis not present

## 2018-01-07 DIAGNOSIS — R300 Dysuria: Secondary | ICD-10-CM | POA: Diagnosis not present

## 2018-01-13 DIAGNOSIS — G90512 Complex regional pain syndrome I of left upper limb: Secondary | ICD-10-CM | POA: Diagnosis not present

## 2018-01-13 DIAGNOSIS — M797 Fibromyalgia: Secondary | ICD-10-CM | POA: Diagnosis not present

## 2018-01-28 DIAGNOSIS — R339 Retention of urine, unspecified: Secondary | ICD-10-CM | POA: Diagnosis not present

## 2018-02-16 ENCOUNTER — Ambulatory Visit: Payer: Medicare PPO | Admitting: Cardiology

## 2018-02-16 ENCOUNTER — Encounter: Payer: Self-pay | Admitting: Cardiology

## 2018-02-16 VITALS — BP 126/74 | HR 77 | Ht 61.0 in | Wt 148.0 lb

## 2018-02-16 DIAGNOSIS — R002 Palpitations: Secondary | ICD-10-CM

## 2018-02-16 DIAGNOSIS — I10 Essential (primary) hypertension: Secondary | ICD-10-CM | POA: Diagnosis not present

## 2018-02-16 NOTE — Progress Notes (Signed)
Cardiology Office Note  Date: 02/16/2018   ID: Carol Thompson, Carol Thompson 11/28/1965, MRN [id number]  PCP: Selinda Flavin, MD  Primary Cardiologist: Nona Dell, MD   Chief Complaint  Patient presents with  . Follow-up palpitations    History of Present Illness: Carol Thompson is a 52 y.o. female seen in consultation in October.  She was seen at that time with a history of recurring palpitations and underwent further cardiac monitoring.  She presents today reporting no escalating symptoms.  30-day event recorder showed sinus rhythm throughout as detailed below with average heart rate in the 70s, no significant arrhythmias or pauses.  I discussed the results with her.  At this point would recommend continuing beta-blocker, she also uses short acting Cardizem intermittently.  No clear indication for further cardiac testing at this time.  Past Medical History:  Diagnosis Date  . Acid reflux   . Asthma   . Chronic fatigue   . Chronic pain   . Cystitis, interstitial   . Depression   . Fibromyalgia   . Hypertension   . IBS (irritable bowel syndrome)   . Internal hemorrhoids    Colonoscopy 11/11 - Dr. Karilyn Cota  . Migraine   . Pelvic floor dysfunction   . Sleep deprivation   . Vulvodynia     Past Surgical History:  Procedure Laterality Date  . ABDOMINAL HYSTERECTOMY    . APPENDECTOMY    . BLADDER SURGERY      Current Outpatient Medications  Medication Sig Dispense Refill  . albuterol (VENTOLIN HFA) 108 (90 BASE) MCG/ACT inhaler Inhale 2 puffs into the lungs every 6 (six) hours as needed for wheezing or shortness of breath.     . baclofen (LIORESAL) 20 MG tablet Take 10-20 mg by mouth 3 (three) times daily.     . beclomethasone (QVAR) 40 MCG/ACT inhaler Inhale 2 puffs into the lungs 2 (two) times daily.    Marland Kitchen diltiazem (CARDIZEM) 30 MG tablet Take 30 mg by mouth 3 (three) times daily.    Marland Kitchen estradiol (ESTRACE) 2 MG tablet Take 2 mg by mouth daily.      . fluticasone (FLONASE)  50 MCG/ACT nasal spray Place 1 spray into both nostrils daily.    Marland Kitchen lidocaine (XYLOCAINE) 2 % jelly Place 1 application into the urethra as needed.    . Melatonin 5 MG CHEW Chew 5 mg by mouth at bedtime.    . metoprolol (TOPROL-XL) 100 MG 24 hr tablet Take 100 mg by mouth daily.      Marland Kitchen omeprazole (PRILOSEC) 20 MG capsule Take 20 mg by mouth daily.     . Oxycodone HCl 10 MG TABS Take 1 tablet by mouth 4 (four) times daily.  0  . promethazine (PHENERGAN) 12.5 MG tablet Take 1 tablet by mouth daily as needed for nausea or vomiting.     . triamterene-hydrochlorothiazide (MAXZIDE-25) 37.5-25 MG per tablet Take 1 tablet by mouth daily.     No current facility-administered medications for this visit.    Allergies:  Ciprofloxacin   Social History: The patient  reports that she has never smoked. She has never used smokeless tobacco. She reports that she does not drink alcohol or use drugs.   ROS:  Please see the history of present illness. Otherwise, complete review of systems is positive for chronic pain.  All other systems are reviewed and negative.   Physical Exam: VS:  BP 126/74 (BP Location: Left Arm)   Pulse 77   Ht  5\' 1"  (1.549 m)   Wt 148 lb (67.1 kg)   SpO2 97%   BMI 27.96 kg/m , BMI Body mass index is 27.96 kg/m.  Wt Readings from Last 3 Encounters:  02/16/18 148 lb (67.1 kg)  01/04/18 146 lb (66.2 kg)  09/03/15 135 lb 9.3 oz (61.5 kg)    General: Patient appears comfortable at rest. HEENT: Conjunctiva and lids normal, oropharynx clear with moist mucosa. Neck: Supple, no elevated JVP or carotid bruits, no thyromegaly. Lungs: Clear to auscultation, nonlabored breathing at rest. Cardiac: Regular rate and rhythm, no S3 or significant systolic murmur, no pericardial rub. Abdomen: Soft, nontender, no hepatomegaly, bowel sounds present, no guarding or rebound. Extremities: No pitting edema, distal pulses 2+.  ECG: I personally reviewed the tracing from 01/04/2018 which showed sinus  rhythm with borderline low voltage, poor R wave progression, Q wave in lead III.  Recent Labwork:  June 2017: Hemoglobin 12.8, platelets 238, potassium 4.0, BUN 11, creatinine 0.8  Other Studies Reviewed Today:  Event recorder 01/11/2018: 30-day event recorder reviewed.  Sinus rhythm was present throughout.  Heart rate ranged from 53 bpm up to 139 bpm with average heart rate 74 bpm.  Lead artifact noted.  There were no significant arrhythmias or pauses.  Echocardiogram 06/18/2011: Study Conclusions  - Left ventricle: Wall thickness was at the upper limits of normal. Systolic function was normal. The estimated ejection fraction was in the range of 60% to 65%. Wall motion was normal; there were no regional wall motion abnormalities. Left ventricular diastolic function parameters were normal. - Mitral valve: Trivial regurgitation. - Tricuspid valve: Trivial regurgitation. - Pericardium, extracardiac: There was no pericardial effusion.  Assessment and Plan:  1.  History of longstanding recurrent palpitations although no defined arrhythmias including by recent follow-up cardiac monitor.  Would continue beta-blocker, also using short acting Cardizem as needed.  No further cardiac testing planned at this time.  2.  Essential hypertension.  May ultimately want to consider dropping Maxide and use combination of Toprol-XL and Cardizem CD.  Would keep follow-up with PCP.  Current medicines were reviewed with the patient today.  Disposition: Follow-up as needed.  Signed, Jonelle Sidle, MD, [facility] 02/16/2018 4:01 PM    [facility] at [facility] [address] [place], [place] [address] Phone: [phone]; Fax: [phone]

## 2018-02-16 NOTE — Patient Instructions (Signed)
Medication Instructions:  Your physician recommends that you continue on your current medications as directed. Please refer to the Current Medication list given to you today.   Labwork: NONE  Testing/Procedures: NONE  Follow-Up: Your physician recommends that you schedule a follow-up appointment in: AS NEEDED      Any Other Special Instructions Will Be Listed Below (If Applicable).     If you need a refill on your cardiac medications before your next appointment, please call your pharmacy.   

## 2018-02-28 DIAGNOSIS — R339 Retention of urine, unspecified: Secondary | ICD-10-CM | POA: Diagnosis not present

## 2018-03-30 DIAGNOSIS — R339 Retention of urine, unspecified: Secondary | ICD-10-CM | POA: Diagnosis not present

## 2018-04-13 DIAGNOSIS — G894 Chronic pain syndrome: Secondary | ICD-10-CM | POA: Diagnosis not present

## 2018-04-13 DIAGNOSIS — M47816 Spondylosis without myelopathy or radiculopathy, lumbar region: Secondary | ICD-10-CM | POA: Diagnosis not present

## 2018-04-13 DIAGNOSIS — G90512 Complex regional pain syndrome I of left upper limb: Secondary | ICD-10-CM | POA: Diagnosis not present

## 2018-04-13 DIAGNOSIS — M797 Fibromyalgia: Secondary | ICD-10-CM | POA: Diagnosis not present

## 2018-04-29 DIAGNOSIS — R339 Retention of urine, unspecified: Secondary | ICD-10-CM | POA: Diagnosis not present

## 2018-05-30 DIAGNOSIS — R350 Frequency of micturition: Secondary | ICD-10-CM | POA: Diagnosis not present

## 2018-05-30 DIAGNOSIS — M797 Fibromyalgia: Secondary | ICD-10-CM | POA: Diagnosis not present

## 2018-05-30 DIAGNOSIS — G90512 Complex regional pain syndrome I of left upper limb: Secondary | ICD-10-CM | POA: Diagnosis not present

## 2018-05-30 DIAGNOSIS — N10 Acute tubulo-interstitial nephritis: Secondary | ICD-10-CM | POA: Diagnosis not present

## 2018-05-30 DIAGNOSIS — R339 Retention of urine, unspecified: Secondary | ICD-10-CM | POA: Diagnosis not present

## 2018-06-04 IMAGING — DX DG ABDOMEN 2V
2 series · 2 of 2 positions shown · non-contrast
Comparison: 09/03/2015

CLINICAL DATA: Small bowel obstruction. abd pain

EXAM:
ABDOMEN - 2 VIEW

[abdomen erect]
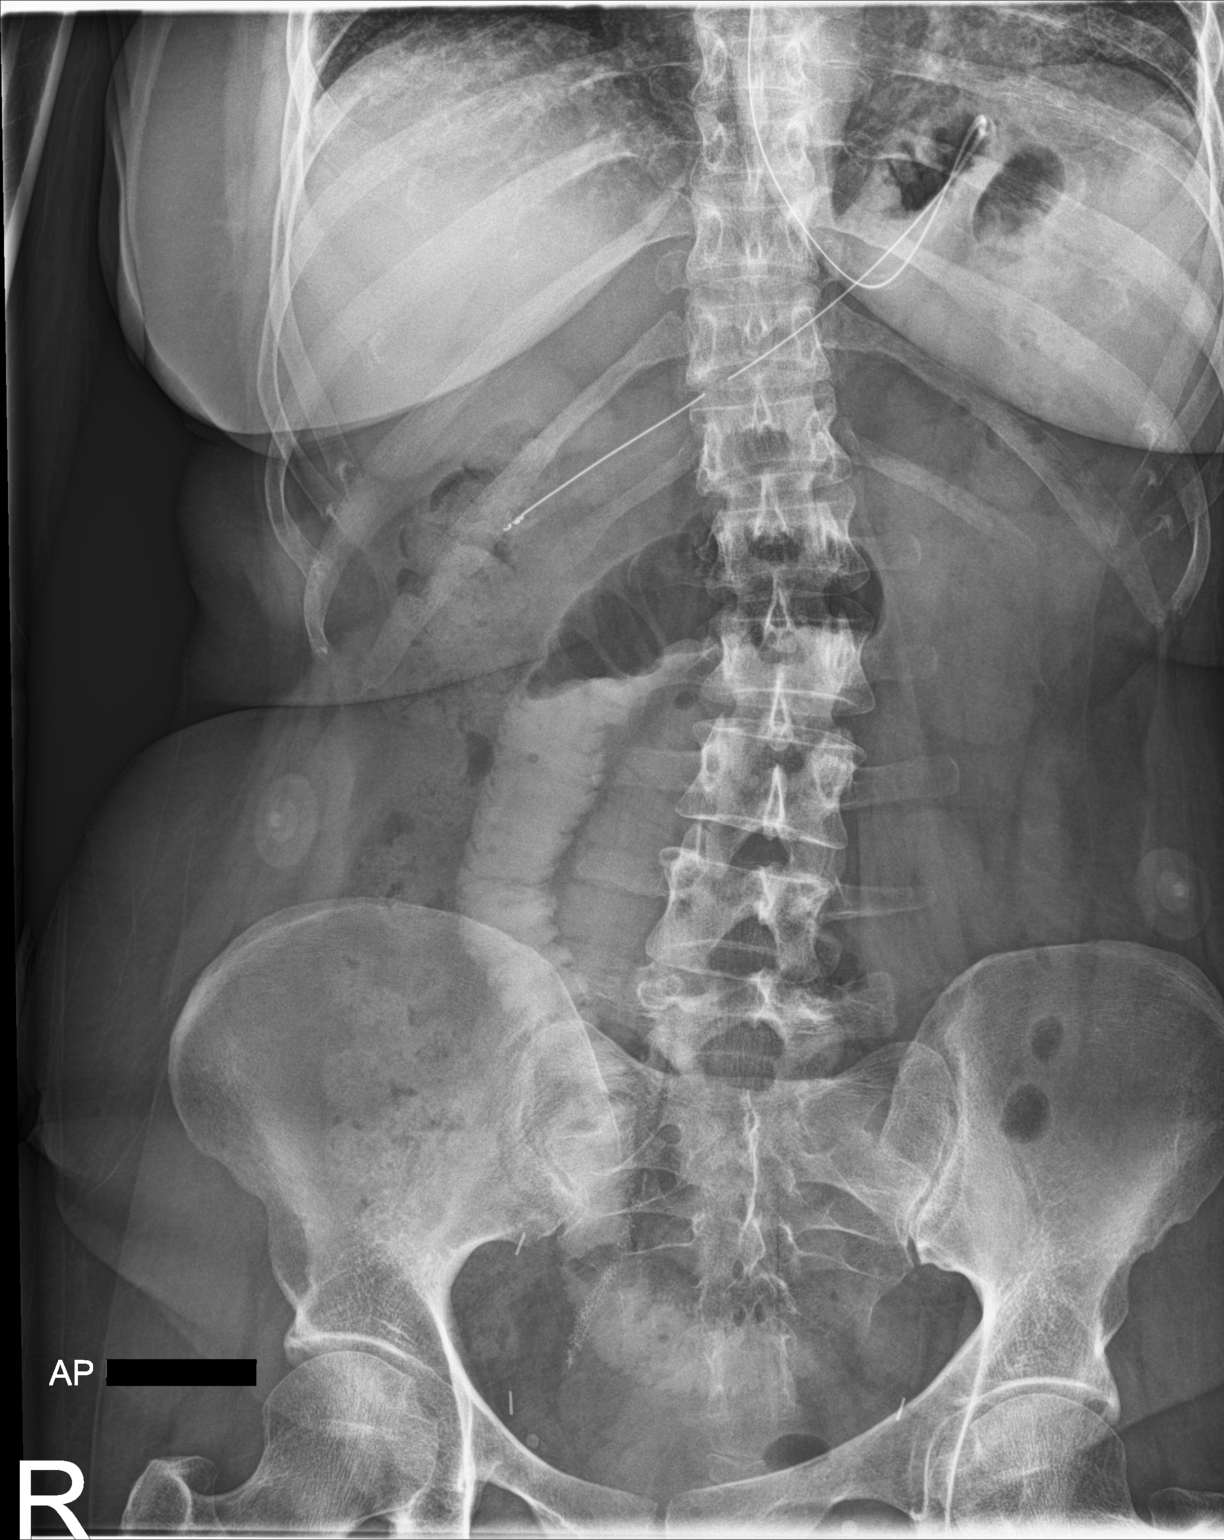

[abdomen supine]
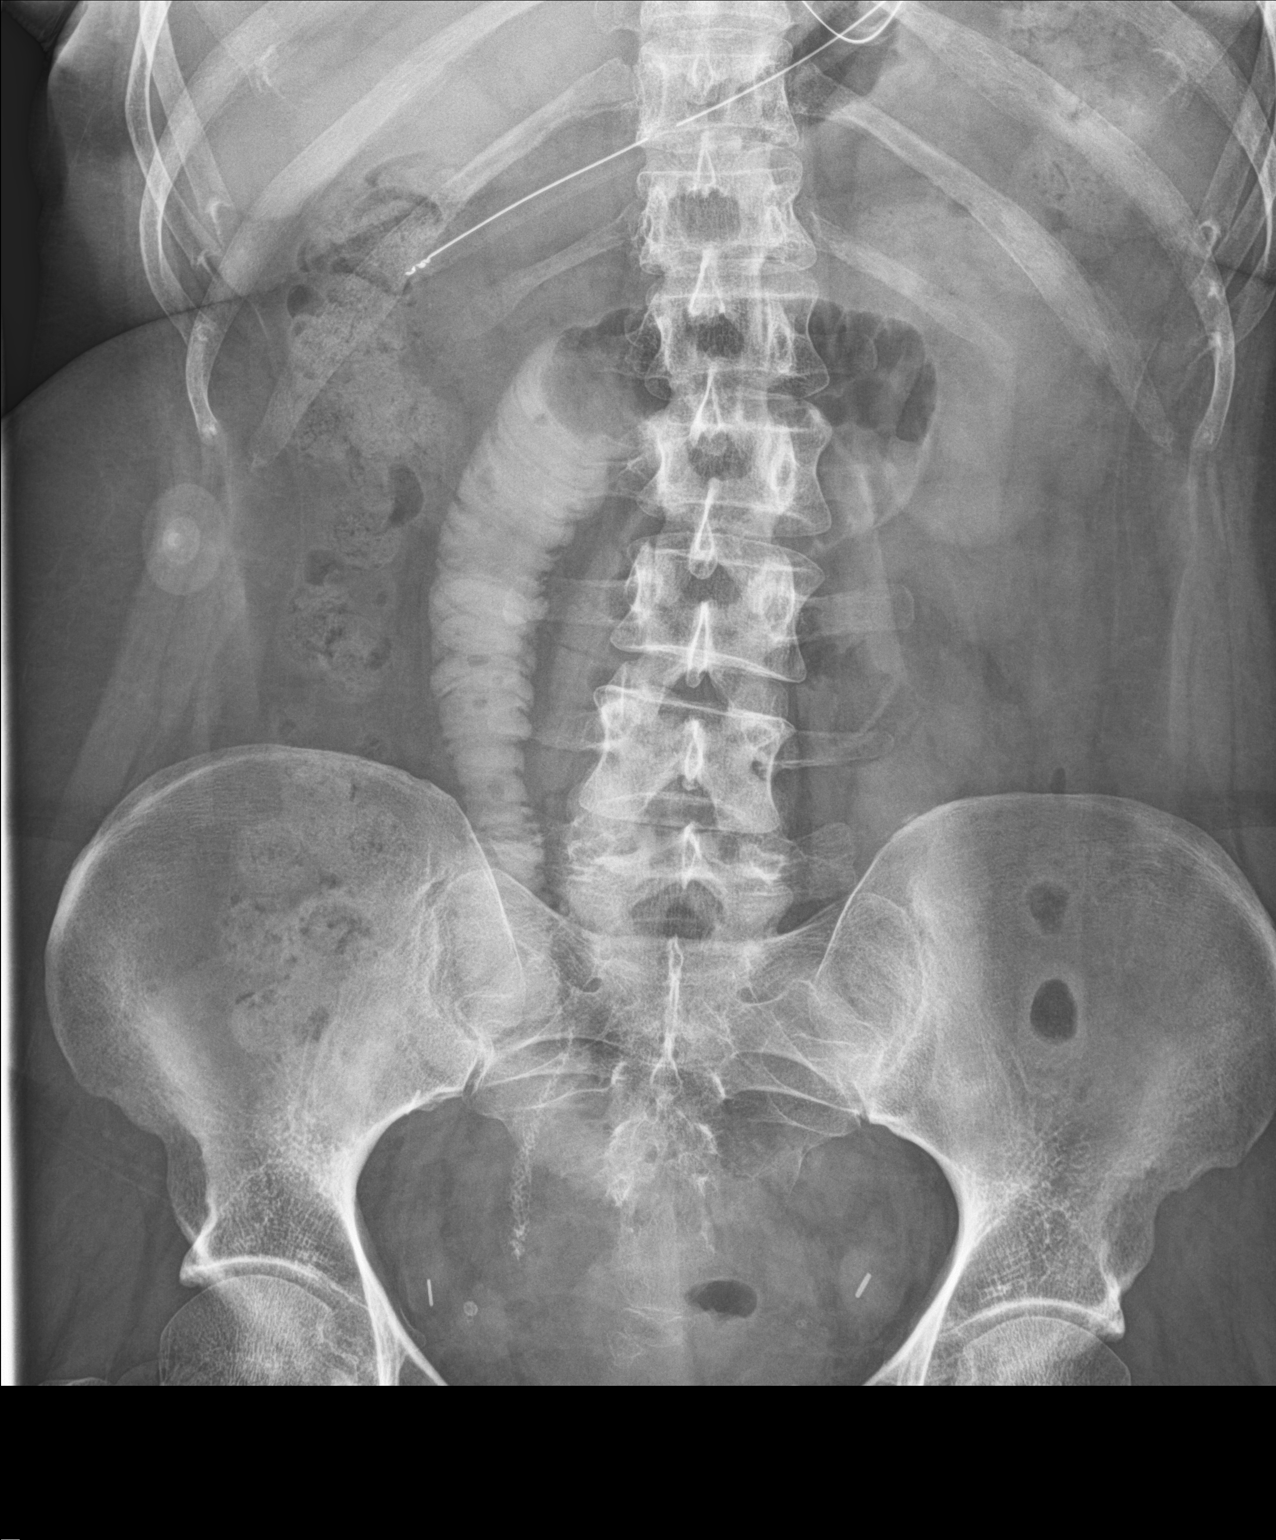

[2 of 2 positions shown; findings below may reference images not displayed]

FINDINGS: Nasogastric tube is in place, tip overlying the level of the distal
stomach. Contrast is identified within a mildly dilated central
abdominal small bowel loop. Surgical clips are noted in the right
lower quadrant stool is identified within nondilated loops of colon.
IMPRESSION: Persistent dilatation of central small bowel loop, containing
retained contrast. Findings are consistent with partial small bowel
obstruction.

## 2018-06-29 DIAGNOSIS — R339 Retention of urine, unspecified: Secondary | ICD-10-CM | POA: Diagnosis not present

## 2018-07-29 DIAGNOSIS — R339 Retention of urine, unspecified: Secondary | ICD-10-CM | POA: Diagnosis not present

## 2018-08-12 DIAGNOSIS — M797 Fibromyalgia: Secondary | ICD-10-CM | POA: Diagnosis not present

## 2018-08-12 DIAGNOSIS — M47816 Spondylosis without myelopathy or radiculopathy, lumbar region: Secondary | ICD-10-CM | POA: Diagnosis not present

## 2018-08-12 DIAGNOSIS — M2550 Pain in unspecified joint: Secondary | ICD-10-CM | POA: Diagnosis not present

## 2018-08-12 DIAGNOSIS — M47812 Spondylosis without myelopathy or radiculopathy, cervical region: Secondary | ICD-10-CM | POA: Diagnosis not present

## 2018-08-12 DIAGNOSIS — G894 Chronic pain syndrome: Secondary | ICD-10-CM | POA: Diagnosis not present

## 2018-08-12 DIAGNOSIS — G90512 Complex regional pain syndrome I of left upper limb: Secondary | ICD-10-CM | POA: Diagnosis not present

## 2018-08-25 DIAGNOSIS — Z906 Acquired absence of other parts of urinary tract: Secondary | ICD-10-CM | POA: Diagnosis not present

## 2018-08-25 DIAGNOSIS — Z1331 Encounter for screening for depression: Secondary | ICD-10-CM | POA: Diagnosis not present

## 2018-08-25 DIAGNOSIS — Z01419 Encounter for gynecological examination (general) (routine) without abnormal findings: Secondary | ICD-10-CM | POA: Diagnosis not present

## 2018-08-25 DIAGNOSIS — G894 Chronic pain syndrome: Secondary | ICD-10-CM | POA: Diagnosis not present

## 2018-08-25 DIAGNOSIS — N9489 Other specified conditions associated with female genital organs and menstrual cycle: Secondary | ICD-10-CM | POA: Diagnosis not present

## 2018-08-25 DIAGNOSIS — Z8541 Personal history of malignant neoplasm of cervix uteri: Secondary | ICD-10-CM | POA: Diagnosis not present

## 2018-08-29 DIAGNOSIS — R339 Retention of urine, unspecified: Secondary | ICD-10-CM | POA: Diagnosis not present

## 2018-09-29 DIAGNOSIS — R339 Retention of urine, unspecified: Secondary | ICD-10-CM | POA: Diagnosis not present

## 2018-10-31 DIAGNOSIS — R339 Retention of urine, unspecified: Secondary | ICD-10-CM | POA: Diagnosis not present

## 2018-11-07 ENCOUNTER — Encounter (INDEPENDENT_AMBULATORY_CARE_PROVIDER_SITE_OTHER): Payer: Self-pay | Admitting: *Deleted

## 2018-11-11 DIAGNOSIS — M797 Fibromyalgia: Secondary | ICD-10-CM | POA: Diagnosis not present

## 2018-11-11 DIAGNOSIS — G894 Chronic pain syndrome: Secondary | ICD-10-CM | POA: Diagnosis not present

## 2018-11-11 DIAGNOSIS — Z5181 Encounter for therapeutic drug level monitoring: Secondary | ICD-10-CM | POA: Diagnosis not present

## 2018-11-11 DIAGNOSIS — G90512 Complex regional pain syndrome I of left upper limb: Secondary | ICD-10-CM | POA: Diagnosis not present

## 2018-11-11 DIAGNOSIS — M47816 Spondylosis without myelopathy or radiculopathy, lumbar region: Secondary | ICD-10-CM | POA: Diagnosis not present

## 2018-11-11 DIAGNOSIS — Z79891 Long term (current) use of opiate analgesic: Secondary | ICD-10-CM | POA: Diagnosis not present

## 2018-11-24 ENCOUNTER — Telehealth (INDEPENDENT_AMBULATORY_CARE_PROVIDER_SITE_OTHER): Payer: Self-pay | Admitting: *Deleted

## 2018-11-24 ENCOUNTER — Encounter (INDEPENDENT_AMBULATORY_CARE_PROVIDER_SITE_OTHER): Payer: Self-pay | Admitting: *Deleted

## 2018-11-24 ENCOUNTER — Encounter (INDEPENDENT_AMBULATORY_CARE_PROVIDER_SITE_OTHER): Payer: Self-pay | Admitting: Nurse Practitioner

## 2018-11-24 ENCOUNTER — Ambulatory Visit (INDEPENDENT_AMBULATORY_CARE_PROVIDER_SITE_OTHER): Payer: Medicare PPO | Admitting: Nurse Practitioner

## 2018-11-24 ENCOUNTER — Other Ambulatory Visit: Payer: Self-pay

## 2018-11-24 VITALS — BP 119/78 | HR 80 | Temp 98.2°F | Ht 61.0 in | Wt 152.1 lb

## 2018-11-24 DIAGNOSIS — K921 Melena: Secondary | ICD-10-CM

## 2018-11-24 DIAGNOSIS — R1010 Upper abdominal pain, unspecified: Secondary | ICD-10-CM | POA: Diagnosis not present

## 2018-11-24 DIAGNOSIS — R140 Abdominal distension (gaseous): Secondary | ICD-10-CM | POA: Diagnosis not present

## 2018-11-24 MED ORDER — PEG 3350-KCL-NA BICARB-NACL 420 G PO SOLR: 4000.0000 mL | Freq: Once | ORAL | 0 refills | Status: AC

## 2018-11-24 NOTE — Patient Instructions (Addendum)
1.  Digital EGD and colonoscopy  2.  Take Pepcid 20 mg once daily.  Take Gas-X 1 tab 3 times daily before meals  3.  Phillips bacteria probiotic 1 capsule once daily  4.  Complete the lab order today  5.  Further follow-up to be determined after EGD and colonoscopy completed

## 2018-11-24 NOTE — Telephone Encounter (Signed)
Patient needs trilyte TCS/EGD sch'd 10/2

## 2018-11-24 NOTE — Progress Notes (Signed)
Subjective:    Patient ID: Carol Thompson, female    DOB: December 01, 1965, 53 y.o.   MRN: [id number]  HPI Carol Thompson is a 53 year old female with a past medical history of asthma, depression, hypertension, fibromyalgia,  Interstitial cystitis, chronic pelvic and vaginal floor pain, cervical cancer x 3, GERD and irritable bowel syndrome. S/P ileal neobladder reconstruction surgery 02/2014.  Partial small bowel obstruction 08/2015, CTAP showed a low-grade small bowel obstruction with transition point at the anastomosis in the mid to distal ileum which resolved without surgical intervention.  She reported having a benign abdominal mass that was removed at the time of her hysterectomy, she stated the mass was attached to the outside of her stomach.  She presents today for further evaluation regarding heartburn, upper abdominal pain with distention and frequent loose stools.  She developed bloody stools 6 weeks ago which has occurred 3-4 times. She describes seeing a moderate amount of bright red blood that turned the toilet water red. The last episode of blood from the rectum occurred approximately 2 weeks ago.  No associated rectal pain.  She complains of generalized abdominal pain during defecation and resolves shortly after she passes a bowel movement.  She is passing 1-5 soft to loose black stools daily which has been her normal pattern since her bladder reconstruction surgery. She performs 5-6 self catheterizations daily.  She underwent a fit test that was positive and she was advised to schedule a screening colonoscopy.  Her last colonoscopy was in 01/23/2010 by Dr. Karilyn Cota which showed external hemorrhoids, otherwise was normal..  She has heartburn twice weekly.  She primarily has upper abdominal bloat with distention.  Request to have an upper endoscopy the time of her colonoscopy.  No fever, sweats or chills.  No weight loss.  No family history of colorectal cancer.  No NSAID use.  Past Medical History:   Diagnosis Date  . Acid reflux   . Asthma   . Chronic fatigue   . Chronic pain   . Cystitis, interstitial   . Depression   . Fibromyalgia   . Hypertension   . IBS (irritable bowel syndrome)   . Internal hemorrhoids    Colonoscopy 11/11 - Dr. Karilyn Cota  . Interstitial cystitis   . Migraine   . Pelvic floor dysfunction   . Sleep deprivation   . Vulvodynia    Past Surgical History:  Procedure Laterality Date  . ABDOMINAL HYSTERECTOMY    . APPENDECTOMY    . BLADDER SURGERY     Current Outpatient Medications on File Prior to Visit  Medication Sig Dispense Refill  . albuterol (VENTOLIN HFA) 108 (90 BASE) MCG/ACT inhaler Inhale 2 puffs into the lungs every 6 (six) hours as needed for wheezing or shortness of breath.     . baclofen (LIORESAL) 20 MG tablet Take 10-20 mg by mouth 3 (three) times daily.     . beclomethasone (QVAR) 40 MCG/ACT inhaler Inhale 2 puffs into the lungs 2 (two) times daily.    Marland Kitchen diltiazem (CARDIZEM) 30 MG tablet Take 30 mg by mouth 3 (three) times daily.    . DULoxetine (CYMBALTA) 20 MG capsule Take 20 mg by mouth. Patient states that she takes 3 times weekly.    Marland Kitchen estradiol (ESTRACE) 2 MG tablet Take 2 mg by mouth daily.      . fluticasone (FLONASE) 50 MCG/ACT nasal spray Place 1 spray into both nostrils daily.    Marland Kitchen ibuprofen (ADVIL) 200 MG tablet Take 200 mg by mouth  as needed.    . lidocaine (XYLOCAINE) 2 % jelly Place 1 application into the urethra as needed.    . Melatonin 5 MG CHEW Chew 5 mg by mouth at bedtime.    . metoprolol (TOPROL-XL) 100 MG 24 hr tablet Take 100 mg by mouth daily.      Marland Kitchen omeprazole (PRILOSEC) 20 MG capsule Take 20 mg by mouth daily.     . ondansetron (ZOFRAN) 8 MG tablet Take 8 mg by mouth as needed.     . Oxycodone HCl 10 MG TABS Take 1 tablet by mouth 4 (four) times daily.  0  . promethazine (PHENERGAN) 12.5 MG tablet Take 1 tablet by mouth daily as needed for nausea or vomiting.     . [DISCONTINUED] amitriptyline (ELAVIL) 25 MG  tablet Take 25 mg by mouth at bedtime.      . [DISCONTINUED] APAP-Isometheptene-Dichloral (MIDRIN) 325-65-100 MG CAPS As needed       No current facility-administered medications on file prior to visit.    Allergies  Allergen Reactions  . Ciprofloxacin Rash  . Ferric Carboxymaltose Hives    Hives in same arm as infusion predominantly but a few hives in other parts of the body  . Gabapentin Hives   Family History  Problem Relation Age of Onset  . Kidney failure Mother        s/p renal transplantation  . Coronary artery disease Father   . Lung cancer Father   . Parkinsonism Brother    Social History   Socioeconomic History  . Marital status: Married    Spouse name: Not on file  . Number of children: 3  . Years of education: Not on file  . Highest education level: Not on file  Occupational History  . Occupation: Disabled  Social Needs  . Financial resource strain: Not on file  . Food insecurity    Worry: Not on file    Inability: Not on file  . Transportation needs    Medical: Not on file    Non-medical: Not on file  Tobacco Use  . Smoking status: Never Smoker  . Smokeless tobacco: Never Used  Substance and Sexual Activity  . Alcohol use: No  . Drug use: No  . Sexual activity: Yes    Birth control/protection: Surgical  Lifestyle  . Physical activity    Days per week: Not on file    Minutes per session: Not on file  . Stress: Not on file  Relationships  . Social Musician on phone: Not on file    Gets together: Not on file    Attends religious service: Not on file    Active member of club or organization: Not on file    Attends meetings of clubs or organizations: Not on file    Relationship status: Not on file  . Intimate partner violence    Fear of current or ex partner: Not on file    Emotionally abused: Not on file    Physically abused: Not on file    Forced sexual activity: Not on file  Other Topics Concern  . Not on file  Social History  Narrative  . Not on file   Review of Systems see HPI, all other systems reviewed and are negative     Objective:   Physical Exam BP 119/78   Pulse 80   Temp 98.2 F (36.8 C) (Oral)   Ht 5\' 1"  (1.549 m)   Wt 152 lb 1.6 oz (  69 kg)   BMI 28.74 kg/m  General: 53 year old female well-developed in no acute distress Eyes: Sclera anicteric, conjunctiva pink Mouth: Dentition intact, no ulcers or lesions Neck: Supple, no lymphadenopathy or thyromegaly Abdomen: The upper abdomen is protuberant with increased adipose distribution when compared to the lower abdomen but this area is soft, moderate epigastric tenderness, less lower abdominal tenderness without rebound or guarding, positive bowel sounds to all 4 quadrants, no HSM Rectal: Deferred Extremities: No edema Neuro: Alert and oriented x4, no focal deficits     Assessment & Plan:   4.  53 year old female with GERD symptoms, upper abdominal pain and bloat -Schedule an EGD, benefits and risk discussed -Pepcid 20 mg once daily.  Gas-X 1 tab 3 times daily before meals. -Phillips bacteria probiotic once daily.  2.  Hematochezia -CBC, CMP and CRP -Schedule diagnostic colonoscopy, benefits and risks discussed with the patient including risk of bleeding,  3.  Past ileal neobladder reconstruction surgery secondary to severe interstitial cystitis  Dr. Dionicia Abler to review H&P prior to patient proceed with EGD and colonoscopy.  Patient potentially at a higher risk of bowel perforation with multiple abdominal/pelvic surgeries, most likely has adhesions  Further follow-up to be determined after EGD and colonoscopy completed

## 2018-11-25 LAB — CBC WITH DIFFERENTIAL/PLATELET
Absolute Monocytes: 490 cells/uL (ref 200–950)
Basophils Absolute: 48 cells/uL (ref 0–200)
Basophils Relative: 0.7 %
Eosinophils Absolute: 88 cells/uL (ref 15–500)
Eosinophils Relative: 1.3 %
HCT: 38.6 % (ref 35.0–45.0)
Hemoglobin: 12.7 g/dL (ref 11.7–15.5)
Lymphs Abs: 1761 cells/uL (ref 850–3900)
MCH: 27.1 pg (ref 27.0–33.0)
MCHC: 32.9 g/dL (ref 32.0–36.0)
MCV: 82.3 fL (ref 80.0–100.0)
MPV: 11 fL (ref 7.5–12.5)
Monocytes Relative: 7.2 %
Neutro Abs: 4413 cells/uL (ref 1500–7800)
Neutrophils Relative %: 64.9 %
Platelets: 243 10*3/uL (ref 140–400)
RBC: 4.69 10*6/uL (ref 3.80–5.10)
RDW: 14.1 % (ref 11.0–15.0)
Total Lymphocyte: 25.9 %
WBC: 6.8 10*3/uL (ref 3.8–10.8)

## 2018-11-25 LAB — COMPLETE METABOLIC PANEL WITH GFR
AG Ratio: 1.4 (calc) (ref 1.0–2.5)
ALT: 13 U/L (ref 6–29)
AST: 16 U/L (ref 10–35)
Albumin: 4.1 g/dL (ref 3.6–5.1)
Alkaline phosphatase (APISO): 65 U/L (ref 37–153)
BUN: 19 mg/dL (ref 7–25)
CO2: 25 mmol/L (ref 20–32)
Calcium: 9.2 mg/dL (ref 8.6–10.4)
Chloride: 108 mmol/L (ref 98–110)
Creat: 0.91 mg/dL (ref 0.50–1.05)
GFR, Est African American: 83 mL/min/{1.73_m2} (ref >=60)
GFR, Est Non African American: 72 mL/min/{1.73_m2} (ref >=60)
Globulin: 3 g/dL (calc) (ref 1.9–3.7)
Glucose, Bld: 96 mg/dL (ref 65–139)
Potassium: 4.5 mmol/L (ref 3.5–5.3)
Sodium: 141 mmol/L (ref 135–146)
Total Bilirubin: 0.3 mg/dL (ref 0.2–1.2)
Total Protein: 7.1 g/dL (ref 6.1–8.1)

## 2018-11-25 LAB — CELIAC DISEASE PANEL
(tTG) Ab, IgA: 1 U/mL
(tTG) Ab, IgG: 3 U/mL
Gliadin IgA: 5 Units
Gliadin IgG: 1 Units
Immunoglobulin A: 284 mg/dL (ref 47–310)

## 2018-11-25 LAB — C-REACTIVE PROTEIN: CRP: 6.6 mg/L (ref <8.0)

## 2018-11-30 DIAGNOSIS — R339 Retention of urine, unspecified: Secondary | ICD-10-CM | POA: Diagnosis not present

## 2018-12-06 ENCOUNTER — Other Ambulatory Visit: Payer: Self-pay

## 2018-12-06 ENCOUNTER — Encounter (HOSPITAL_COMMUNITY): Payer: Self-pay

## 2018-12-07 ENCOUNTER — Encounter (HOSPITAL_COMMUNITY)
Admission: RE | Admit: 2018-12-07 | Discharge: 2018-12-07 | Disposition: A | Discharge status: Home / Self Care | Payer: Medicare PPO | Source: Ambulatory Visit | Attending: Internal Medicine | Admitting: Internal Medicine

## 2018-12-07 ENCOUNTER — Other Ambulatory Visit (HOSPITAL_COMMUNITY)
Admission: RE | Admit: 2018-12-07 | Discharge: 2018-12-07 | Disposition: A | Discharge status: Home / Self Care | Payer: Medicare PPO | Source: Ambulatory Visit | Attending: Internal Medicine | Admitting: Internal Medicine

## 2018-12-07 DIAGNOSIS — Z01812 Encounter for preprocedural laboratory examination: Secondary | ICD-10-CM | POA: Diagnosis not present

## 2018-12-07 DIAGNOSIS — K449 Diaphragmatic hernia without obstruction or gangrene: Secondary | ICD-10-CM | POA: Insufficient documentation

## 2018-12-07 DIAGNOSIS — K6289 Other specified diseases of anus and rectum: Secondary | ICD-10-CM | POA: Insufficient documentation

## 2018-12-07 DIAGNOSIS — K219 Gastro-esophageal reflux disease without esophagitis: Secondary | ICD-10-CM | POA: Diagnosis not present

## 2018-12-07 DIAGNOSIS — Z20828 Contact with and (suspected) exposure to other viral communicable diseases: Secondary | ICD-10-CM | POA: Insufficient documentation

## 2018-12-07 DIAGNOSIS — K649 Unspecified hemorrhoids: Secondary | ICD-10-CM | POA: Diagnosis not present

## 2018-12-08 DIAGNOSIS — R197 Diarrhea, unspecified: Secondary | ICD-10-CM | POA: Diagnosis not present

## 2018-12-08 DIAGNOSIS — Z6828 Body mass index (BMI) 28.0-28.9, adult: Secondary | ICD-10-CM | POA: Diagnosis not present

## 2018-12-08 DIAGNOSIS — R1013 Epigastric pain: Secondary | ICD-10-CM | POA: Diagnosis not present

## 2018-12-08 DIAGNOSIS — K644 Residual hemorrhoidal skin tags: Secondary | ICD-10-CM | POA: Diagnosis not present

## 2018-12-08 DIAGNOSIS — K6289 Other specified diseases of anus and rectum: Secondary | ICD-10-CM | POA: Diagnosis not present

## 2018-12-08 DIAGNOSIS — I10 Essential (primary) hypertension: Secondary | ICD-10-CM | POA: Diagnosis not present

## 2018-12-08 DIAGNOSIS — M797 Fibromyalgia: Secondary | ICD-10-CM | POA: Diagnosis not present

## 2018-12-08 DIAGNOSIS — K625 Hemorrhage of anus and rectum: Secondary | ICD-10-CM | POA: Diagnosis not present

## 2018-12-08 DIAGNOSIS — R195 Other fecal abnormalities: Secondary | ICD-10-CM | POA: Diagnosis not present

## 2018-12-08 DIAGNOSIS — G90512 Complex regional pain syndrome I of left upper limb: Secondary | ICD-10-CM | POA: Diagnosis not present

## 2018-12-08 DIAGNOSIS — K449 Diaphragmatic hernia without obstruction or gangrene: Secondary | ICD-10-CM | POA: Diagnosis not present

## 2018-12-08 DIAGNOSIS — R140 Abdominal distension (gaseous): Secondary | ICD-10-CM | POA: Diagnosis not present

## 2018-12-08 DIAGNOSIS — Z23 Encounter for immunization: Secondary | ICD-10-CM | POA: Diagnosis not present

## 2018-12-08 DIAGNOSIS — K2100 Gastro-esophageal reflux disease with esophagitis, without bleeding: Secondary | ICD-10-CM | POA: Diagnosis not present

## 2018-12-08 LAB — SARS CORONAVIRUS 2 (TAT 6-24 HRS): SARS Coronavirus 2: NEGATIVE

## 2018-12-09 ENCOUNTER — Ambulatory Visit (HOSPITAL_COMMUNITY)
Admission: RE | Admit: 2018-12-09 | Discharge: 2018-12-09 | Disposition: A | Discharge status: Home / Self Care | Payer: Medicare PPO | Attending: Internal Medicine | Admitting: Internal Medicine

## 2018-12-09 ENCOUNTER — Ambulatory Visit (HOSPITAL_COMMUNITY): Payer: Medicare PPO | Admitting: Anesthesiology

## 2018-12-09 ENCOUNTER — Encounter (HOSPITAL_COMMUNITY)
Admission: RE | Disposition: A | Discharge status: Home / Self Care | Payer: Self-pay | Source: Home / Self Care | Attending: Internal Medicine

## 2018-12-09 ENCOUNTER — Other Ambulatory Visit: Payer: Self-pay

## 2018-12-09 ENCOUNTER — Encounter (HOSPITAL_COMMUNITY): Payer: Self-pay | Admitting: *Deleted

## 2018-12-09 DIAGNOSIS — K644 Residual hemorrhoidal skin tags: Secondary | ICD-10-CM | POA: Insufficient documentation

## 2018-12-09 DIAGNOSIS — I10 Essential (primary) hypertension: Secondary | ICD-10-CM | POA: Insufficient documentation

## 2018-12-09 DIAGNOSIS — R197 Diarrhea, unspecified: Secondary | ICD-10-CM | POA: Insufficient documentation

## 2018-12-09 DIAGNOSIS — K6289 Other specified diseases of anus and rectum: Secondary | ICD-10-CM | POA: Diagnosis not present

## 2018-12-09 DIAGNOSIS — K625 Hemorrhage of anus and rectum: Secondary | ICD-10-CM | POA: Insufficient documentation

## 2018-12-09 DIAGNOSIS — M797 Fibromyalgia: Secondary | ICD-10-CM | POA: Diagnosis not present

## 2018-12-09 DIAGNOSIS — Z881 Allergy status to other antibiotic agents status: Secondary | ICD-10-CM | POA: Diagnosis not present

## 2018-12-09 DIAGNOSIS — Z8541 Personal history of malignant neoplasm of cervix uteri: Secondary | ICD-10-CM | POA: Insufficient documentation

## 2018-12-09 DIAGNOSIS — R195 Other fecal abnormalities: Secondary | ICD-10-CM | POA: Diagnosis not present

## 2018-12-09 DIAGNOSIS — Z8249 Family history of ischemic heart disease and other diseases of the circulatory system: Secondary | ICD-10-CM | POA: Diagnosis not present

## 2018-12-09 DIAGNOSIS — R140 Abdominal distension (gaseous): Secondary | ICD-10-CM | POA: Insufficient documentation

## 2018-12-09 DIAGNOSIS — Z79899 Other long term (current) drug therapy: Secondary | ICD-10-CM | POA: Diagnosis not present

## 2018-12-09 DIAGNOSIS — R1013 Epigastric pain: Secondary | ICD-10-CM

## 2018-12-09 DIAGNOSIS — J45909 Unspecified asthma, uncomplicated: Secondary | ICD-10-CM | POA: Diagnosis not present

## 2018-12-09 DIAGNOSIS — Z888 Allergy status to other drugs, medicaments and biological substances status: Secondary | ICD-10-CM | POA: Insufficient documentation

## 2018-12-09 DIAGNOSIS — R1010 Upper abdominal pain, unspecified: Secondary | ICD-10-CM | POA: Insufficient documentation

## 2018-12-09 DIAGNOSIS — K6389 Other specified diseases of intestine: Secondary | ICD-10-CM | POA: Diagnosis not present

## 2018-12-09 DIAGNOSIS — K449 Diaphragmatic hernia without obstruction or gangrene: Secondary | ICD-10-CM | POA: Diagnosis not present

## 2018-12-09 DIAGNOSIS — Z7951 Long term (current) use of inhaled steroids: Secondary | ICD-10-CM | POA: Insufficient documentation

## 2018-12-09 DIAGNOSIS — K921 Melena: Secondary | ICD-10-CM | POA: Insufficient documentation

## 2018-12-09 DIAGNOSIS — K2100 Gastro-esophageal reflux disease with esophagitis, without bleeding: Secondary | ICD-10-CM | POA: Diagnosis not present

## 2018-12-09 HISTORY — PX: ESOPHAGOGASTRODUODENOSCOPY (EGD) WITH PROPOFOL: SHX5813

## 2018-12-09 HISTORY — PX: COLONOSCOPY WITH PROPOFOL: SHX5780

## 2018-12-09 SURGERY — ESOPHAGOGASTRODUODENOSCOPY (EGD) WITH PROPOFOL
Anesthesia: General

## 2018-12-09 MED ORDER — KETAMINE HCL 50 MG/5ML IJ SOSY
PREFILLED_SYRINGE | INTRAMUSCULAR | Status: AC
  Filled 2018-12-09: qty 5

## 2018-12-09 MED ORDER — HYDROMORPHONE HCL 1 MG/ML IJ SOLN: 0.2500 mg | INTRAMUSCULAR | Status: DC | PRN

## 2018-12-09 MED ORDER — MIDAZOLAM HCL 2 MG/2ML IJ SOLN: 0.5000 mg | Freq: Once | INTRAMUSCULAR | Status: DC | PRN

## 2018-12-09 MED ORDER — FAMOTIDINE 20 MG PO TABS: 20.0000 mg | ORAL_TABLET | Freq: Every day | ORAL | Status: DC

## 2018-12-09 MED ORDER — MIDAZOLAM HCL 2 MG/2ML IJ SOLN
INTRAMUSCULAR | Status: AC
  Filled 2018-12-09: qty 2

## 2018-12-09 MED ORDER — PROMETHAZINE HCL 25 MG/ML IJ SOLN: 6.2500 mg | INTRAMUSCULAR | Status: DC | PRN

## 2018-12-09 MED ORDER — DICYCLOMINE HCL 10 MG PO CAPS: 10.0000 mg | ORAL_CAPSULE | Freq: Two times a day (BID) | ORAL | 2 refills | Status: DC

## 2018-12-09 MED ORDER — CHLORHEXIDINE GLUCONATE CLOTH 2 % EX PADS: 6.0000 | MEDICATED_PAD | Freq: Once | CUTANEOUS | Status: DC

## 2018-12-09 MED ORDER — KETAMINE HCL 10 MG/ML IJ SOLN
INTRAMUSCULAR | Status: DC | PRN
  Administered 2018-12-09: 20 mg via INTRAVENOUS

## 2018-12-09 MED ORDER — MIDAZOLAM HCL 5 MG/5ML IJ SOLN
INTRAMUSCULAR | Status: DC | PRN
  Administered 2018-12-09: 2 mg via INTRAVENOUS

## 2018-12-09 MED ORDER — PROPOFOL 10 MG/ML IV BOLUS
INTRAVENOUS | Status: DC | PRN
  Administered 2018-12-09: 40 mg via INTRAVENOUS

## 2018-12-09 MED ORDER — PROPOFOL 500 MG/50ML IV EMUL
INTRAVENOUS | Status: DC | PRN
  Administered 2018-12-09: 150 ug/kg/min via INTRAVENOUS

## 2018-12-09 MED ORDER — HYDROCODONE-ACETAMINOPHEN 7.5-325 MG PO TABS: 1.0000 | ORAL_TABLET | Freq: Once | ORAL | Status: DC | PRN

## 2018-12-09 MED ORDER — PROPOFOL 10 MG/ML IV BOLUS
INTRAVENOUS | Status: AC
  Filled 2018-12-09: qty 20

## 2018-12-09 MED ORDER — STERILE WATER FOR IRRIGATION IR SOLN
Status: DC | PRN
  Administered 2018-12-09: 2.5 mL

## 2018-12-09 MED ORDER — LACTATED RINGERS IV SOLN
INTRAVENOUS | Status: DC
  Administered 2018-12-09: 13:00:00 via INTRAVENOUS

## 2018-12-09 NOTE — Transfer of Care (Signed)
Immediate Anesthesia Transfer of Care Note  Patient: Carol Thompson  Procedure(s) Performed: ESOPHAGOGASTRODUODENOSCOPY (EGD) WITH PROPOFOL (N/A ) COLONOSCOPY WITH PROPOFOL (N/A )  Patient Location: PACU  Anesthesia Type:MAC  Level of Consciousness: awake, alert  and oriented  Airway & Oxygen Therapy: Patient Spontanous Breathing  Post-op Assessment: Report given to RN and Post -op Vital signs reviewed and stable  Post vital signs: Reviewed and stable  Last Vitals:  Vitals Value Taken Time  BP    Temp    Pulse 77 12/09/18 1500  Resp 19 12/09/18 1500  SpO2 97 % 12/09/18 1500  Vitals shown include unvalidated device data.  Last Pain:  Vitals:   12/09/18 1229  TempSrc: Oral  PainSc: 4       Patients Stated Pain Goal: 3 (59/53/96 7289)  Complications: No apparent anesthesia complications

## 2018-12-09 NOTE — Anesthesia Preprocedure Evaluation (Addendum)
Anesthesia Evaluation  Patient identified by MRN, date of birth, ID band Patient awake  General Assessment Comment:Reports being aware for one of her three Stellate Ganglion blocks in 2015  Denies any other personal or familial anesth issues   Reviewed: Allergy & Precautions, NPO status , Patient's Chart, lab work & pertinent test results, reviewed documented beta blocker date and time   History of Anesthesia Complications (+) AWARENESS UNDER ANESTHESIA and history of anesthetic complications  Airway Mallampati: II  TM Distance: >3 FB Neck ROM: Full    Dental no notable dental hx. (+) Teeth Intact Reports one recently broken lower tooth :   Pulmonary asthma ,    Pulmonary exam normal breath sounds clear to auscultation       Cardiovascular Exercise Tolerance: Good hypertension, Pt. on medications and Pt. on home beta blockers negative cardio ROS Normal cardiovascular examI Rhythm:Regular Rate:Normal  States had irreg Heart rate previously from lyrica remotely   Had negative Holter 01/2018 States does well with the metoprolol  Only uses Diltiazem PRN- denies h/o Afib or previous blood thinner use    Neuro/Psych  Headaches, Depression Pt with known CRPS on pain meds  Neuromuscular disease negative psych ROS   GI/Hepatic Neg liver ROS, GERD  Medicated and Controlled,  Endo/Other  negative endocrine ROS  Renal/GU negative Renal ROS  negative genitourinary   Musculoskeletal  (+) Fibromyalgia -, narcotic dependent  Abdominal   Peds negative pediatric ROS (+)  Hematology negative hematology ROS (+)   Anesthesia Other Findings   Reproductive/Obstetrics negative OB ROS                             Anesthesia Physical Anesthesia Plan  ASA: III  Anesthesia Plan: General   Post-op Pain Management:    Induction: Intravenous  PONV Risk Score and Plan: 3 and Midazolam, TIVA, Propofol  infusion, Ondansetron and Treatment may vary due to age or medical condition  Airway Management Planned: Nasal Cannula and Simple Face Mask  Additional Equipment:   Intra-op Plan:   Post-operative Plan:   Informed Consent: I have reviewed the patients History and Physical, chart, labs and discussed the procedure including the risks, benefits and alternatives for the proposed anesthesia with the patient or authorized representative who has indicated his/her understanding and acceptance.     Dental advisory given  Plan Discussed with: CRNA  Anesthesia Plan Comments: (Plan Full PPE use  Plan GA with GETA as needed d/w pt -WTP with same after Q&A)        Anesthesia Quick Evaluation

## 2018-12-09 NOTE — Anesthesia Postprocedure Evaluation (Signed)
Anesthesia Post Note  Patient: Carol Thompson  Procedure(s) Performed: ESOPHAGOGASTRODUODENOSCOPY (EGD) WITH PROPOFOL (N/A ) COLONOSCOPY WITH PROPOFOL (N/A )  Patient location during evaluation: PACU Anesthesia Type: MAC Level of consciousness: awake, oriented and awake and alert Pain management: pain level controlled Vital Signs Assessment: post-procedure vital signs reviewed and stable Respiratory status: spontaneous breathing, nonlabored ventilation and respiratory function stable Cardiovascular status: stable Postop Assessment: no apparent nausea or vomiting Anesthetic complications: no     Last Vitals:  Vitals:   12/09/18 1229 12/09/18 1500  BP: (!) 136/92   Pulse: 86 82  Resp: 18   Temp: 37.1 C (P) 36.7 C  SpO2: 97%     Last Pain:  Vitals:   12/09/18 1229  TempSrc: Oral  PainSc: 4                  Jurline Folger

## 2018-12-09 NOTE — H&P (Signed)
Carol Thompson is an 53 y.o. female.   Chief Complaint: Patient is here for esophagogastroduodenoscopy followed by colonoscopy. HPI: Patient is 53 year old Caucasian female who has chronic GERD who now presents with epigastric pain postprandial bloating and frequent burping.  She is still having heartburn but once or twice a week in spite of taking medication.  She has not experienced nausea or vomiting.  She also complains of epigastric and periumbilical pain.  She also pain across her lower abdomen which she believes is related to letter issues.  She also gives history of intermittent diarrhea and rectal bleeding.  Few weeks ago she passed large amount of bright red blood per rectum with a bowel movement.  Since then she has had few more episodes.  She had FIT test done and was positive.  She says on Sunday she has as many as 5 stools per day.  She may skip a day after having multiple bowel movements but she has not been constipated lately.  She has a history of interstitial cystitis for which she underwent bladder reconstructive surgery.  She is unable to void and does self-catheterization 6 times a day.  She has not had any study to examine her gallbladder. Family history significant for CRC in a paternal uncle who was 65 at the time of diagnosis. Her mother had chronic diarrhea but she was never diagnosed with IBD or any other condition.  Past Medical History:  Diagnosis Date  . Acid reflux   . Asthma   . Cervical cancer (HCC)   . Chronic fatigue   . Chronic pain   . Cystitis, interstitial   . Depression   . Fibromyalgia   . Hypertension   . IBS (irritable bowel syndrome)   . Internal hemorrhoids    Colonoscopy 11/11 - Dr. Lakrista Scaduto  . Interstitial cystitis   . Migraine   . Pelvic floor dysfunction   . Sleep deprivation   . Vulvodynia     Past Surgical History:  Procedure Laterality Date  . ABDOMINAL HYSTERECTOMY    . APPENDECTOMY    . BLADDER SURGERY     12 /2015 ileal neobladder  secondary to severe interstitial cystitis     Family History  Problem Relation Age of Onset  . Kidney failure Mother        s/p renal transplantation  . Coronary artery disease Father   . Lung cancer Father   . Parkinsonism Brother    Social History:  reports that she has never smoked. She has never used smokeless tobacco. She reports that she does not drink alcohol or use drugs.  Allergies:  Allergies  Allergen Reactions  . Ciprofloxacin Rash  . Ferric Carboxymaltose Hives    Hives in same arm as infusion predominantly but a few hives in other parts of the body  . Gabapentin Hives    Medications Prior to Admission  Medication Sig Dispense Refill  . albuterol (VENTOLIN HFA) 108 (90 BASE) MCG/ACT inhaler Inhale 2 puffs into the lungs every 6 (six) hours as needed for wheezing or shortness of breath.     . baclofen (LIORESAL) 20 MG tablet Take 20 mg by mouth 2 (two) times daily.     . beclomethasone (QVAR) 40 MCG/ACT inhaler Inhale 1 puff into the lungs daily.     Marland Kitchen diltiazem (CARDIZEM) 30 MG tablet Take 30 mg by mouth every evening.     . DULoxetine (CYMBALTA) 20 MG capsule Take 20 mg by mouth every 3 (three) days.     Marland Kitchen  estradiol (ESTRACE) 2 MG tablet Take 2 mg by mouth every evening.     . fluticasone (FLONASE) 50 MCG/ACT nasal spray Place 1 spray into both nostrils every evening.     Marland Kitchen ibuprofen (ADVIL) 200 MG tablet Take 200 mg by mouth every 8 (eight) hours as needed (pain.).     Marland Kitchen lidocaine (XYLOCAINE) 2 % jelly Place 1 application into the urethra 6 (six) times daily.     . metoprolol (TOPROL-XL) 100 MG 24 hr tablet Take 100 mg by mouth daily.      Marland Kitchen omeprazole (PRILOSEC) 20 MG capsule Take 20 mg by mouth daily as needed (acid reflux/indigestion.).     Marland Kitchen ondansetron (ZOFRAN) 8 MG tablet Take 8 mg by mouth every 8 (eight) hours as needed for nausea or vomiting.     . Oxycodone HCl 10 MG TABS Take 10 mg by mouth 2 (two) times daily as needed (breakthrough pain.).   0  .  promethazine (PHENERGAN) 25 MG tablet Take 25 mg by mouth daily as needed (nausea not resolved by zofran.).    Marland Kitchen XTAMPZA ER 9 MG C12A Take 9 mg by mouth 2 (two) times daily.    . Melatonin 5 MG CHEW Chew 5 mg by mouth at bedtime.      No results found for this or any previous visit (from the past 48 hour(s)). No results found.  ROS  Blood pressure (!) 136/92, pulse 86, temperature 98.8 F (37.1 C), temperature source Oral, resp. rate 18, SpO2 97 %. Physical Exam  Constitutional: She appears well-developed and well-nourished.  HENT:  Mouth/Throat: Oropharynx is clear and moist.  Eyes: Conjunctivae are normal. No scleral icterus.  Neck: No thyromegaly present.  Cardiovascular: Normal rate, regular rhythm and normal heart sounds.  No murmur heard. Respiratory: Effort normal and breath sounds normal.  GI:  Abdomen is full.  She has r across lower abdomen.  Abdomen is soft.  She has mild tenderness in hypogastric region as well as epigastrium.  No organomegaly or masses  Musculoskeletal:        General: No edema.  Neurological: She is alert.  Skin: Skin is warm and dry.     Assessment/Plan Epigastric pain and postprandial bloating. Chronic GERD. Rectal bleeding and diarrhea. Diagnostic EGD and colonoscopy under monitored anesthesia care.  Hildred Laser, MD 12/09/2018, 2:07 PM

## 2018-12-09 NOTE — Op Note (Signed)
[facility] Patient Name: Carol Thompson Procedure Date: 12/09/2018 2:12 PM MRN: [id number] Date of Birth: May 21, 1965 Attending MD: Lionel December , MD CSN: [id number] Age: 53 Admit Type: Outpatient Procedure:                Upper GI endoscopy Indications:              Epigastric abdominal pain, Abdominal bloating Providers:                Lionel December, MD, Nena Polio, RN, Edythe Clarity,                            Technician Referring MD:             Kaylyn Layer. Dimas Aguas, MD Medicines:                Propofol per Anesthesia Complications:            No immediate complications. Estimated Blood Loss:     Estimated blood loss was minimal. Procedure:                Pre-Anesthesia Assessment:                           - Prior to the procedure, a History and Physical                            was performed, and patient medications and                            allergies were reviewed. The patient's tolerance of                            previous anesthesia was also reviewed. The risks                            and benefits of the procedure and the sedation                            options and risks were discussed with the patient.                            All questions were answered, and informed consent                            was obtained. Prior Anticoagulants: The patient has                            taken no previous anticoagulant or antiplatelet                            agents except for NSAID medication. ASA Grade                            Assessment: III - A patient with severe systemic  disease. After reviewing the risks and benefits,                            the patient was deemed in satisfactory condition to                            undergo the procedure.                           After obtaining informed consent, the endoscope was                            passed under direct vision. Throughout the                            procedure,  the patient's blood pressure, pulse, and                            oxygen saturations were monitored continuously. The                            [id number]-[id number] ([id number]) scope was introduced through the                            mouth, and advanced to the second part of duodenum.                            The upper GI endoscopy was accomplished without                            difficulty. The patient tolerated the procedure                            well. Scope In: 2:25:18 PM Scope Out: 2:29:09 PM Total Procedure Duration: 0 hours 3 minutes 51 seconds  Findings:      The examined esophagus was normal.      Focal erythema and edema at GEJ esophagitis was found 30 cm from the       incisors.      A 3 cm hiatal hernia was present.      The entire examined stomach was normal.      The duodenal bulb and second portion of the duodenum were normal. Impression:               - Normal esophagus.                           - Mild at GEJ reflux esophagitis.                           - 3 cm hiatal hernia.                           - Normal stomach.                           -  Normal duodenal bulb and second portion of the                            duodenum.                           - No specimens collected. Moderate Sedation:      Per Anesthesia Care Recommendation:           - Patient has a contact number available for                            emergencies. The signs and symptoms of potential                            delayed complications were discussed with the                            patient. Return to normal activities tomorrow.                            Written discharge instructions were provided to the                            patient.                           - Resume previous diet today.                           - Take omeprazole 20 mg po qam.                           - Famotidine 20 mg po qhs.                           - Continue present medications.                            - Perform an abdominal ultrasound at appointment to                            be scheduled. Procedure Code(s):        --- Professional ---                           [phone], Esophagogastroduodenoscopy, flexible,                            transoral; diagnostic, including collection of                            specimen(s) by brushing or washing, when performed                            (separate procedure) Diagnosis Code(s):        --- Professional ---  K21.0, Gastro-esophageal reflux disease with                            esophagitis                           K44.9, Diaphragmatic hernia without obstruction or                            gangrene                           R10.13, Epigastric pain                           R14.0, Abdominal distension (gaseous) CPT copyright 2019 American [employer]. All rights reserved. The codes documented in this report are preliminary and upon coder review may  be revised to meet current compliance requirements. Lionel December, MD Lionel December, MD 12/09/2018 3:12:07 PM This report has been signed electronically. Number of Addenda: 0

## 2018-12-09 NOTE — Op Note (Signed)
[facility] Patient Name: Carol Thompson Procedure Date: 12/09/2018 2:32 PM MRN: [id number] Date of Birth: 1965-09-20 Attending MD: Lionel December , MD CSN: [id number] Age: 53 Admit Type: Outpatient Procedure:                Colonoscopy Indications:              Positive fecal immunochemical test, Rectal                            bleeding, Diarrhea Providers:                Lionel December, MD, Nena Polio, RN, Edythe Clarity,                            Technician Referring MD:             Kaylyn Layer. Dimas Aguas, MD Medicines:                Propofol per Anesthesia Complications:            No immediate complications. Estimated Blood Loss:     Estimated blood loss was minimal. Procedure:                Pre-Anesthesia Assessment:                           - Prior to the procedure, a History and Physical                            was performed, and patient medications and                            allergies were reviewed. The patient's tolerance of                            previous anesthesia was also reviewed. The risks                            and benefits of the procedure and the sedation                            options and risks were discussed with the patient.                            All questions were answered, and informed consent                            was obtained. Prior Anticoagulants: The patient has                            taken no previous anticoagulant or antiplatelet                            agents except for NSAID medication. ASA Grade                            Assessment: III -  A patient with severe systemic                            disease. After reviewing the risks and benefits,                            the patient was deemed in satisfactory condition to                            undergo the procedure.                           After obtaining informed consent, the colonoscope                            was passed under direct vision. Throughout the                             procedure, the patient's blood pressure, pulse, and                            oxygen saturations were monitored continuously. The                            PCF-H190DL ([id number]) was introduced through the                            anus and advanced to the the cecum, identified by                            appendiceal orifice and ileocecal valve. The                            colonoscopy was performed without difficulty. The                            patient tolerated the procedure well. The quality                            of the bowel preparation was good. The ileocecal                            valve, appendiceal orifice, and rectum were                            photographed. Scope In: 2:34:03 PM Scope Out: 2:50:07 PM Scope Withdrawal Time: 0 hours 10 minutes 37 seconds  Total Procedure Duration: 0 hours 16 minutes 4 seconds  Findings:      The perianal and digital rectal examinations were normal.      The colon (entire examined portion) appeared normal. Random biopsies       were taken with a cold forceps from ascending and sigmoid colon for       histology. The pathology specimen was placed into Bottle Number 1.      External hemorrhoids  were found during retroflexion. The hemorrhoids       were small.      Anal papilla(e) were hypertrophied. Impression:               - The entire examined colon is normal. Biopsied.                           - External hemorrhoids.                           - Anal papilla(e) were hypertrophied. Moderate Sedation:      Per Anesthesia Care Recommendation:           - Patient has a contact number available for                            emergencies. The signs and symptoms of potential                            delayed complications were discussed with the                            patient. Return to normal activities tomorrow.                            Written discharge instructions were provided to the                             patient.                           - Resume previous diet today.                           - Continue present medications.                           - Use Bentyl (dicyclomine) 10 mg PO BID 30 min AC.                           - No aspirin, ibuprofen, naproxen, or other                            non-steroidal anti-inflammatory drugs for 1 day.                           - Await pathology results.                           - Repeat colonoscopy in 10 years for screening                            purposes. Procedure Code(s):        --- Professional ---                           [id number], Colonoscopy, flexible; with biopsy, single  or multiple Diagnosis Code(s):        --- Professional ---                           K64.4, Residual hemorrhoidal skin tags                           K62.89, Other specified diseases of anus and rectum                           R19.5, Other fecal abnormalities                           K62.5, Hemorrhage of anus and rectum                           R19.7, Diarrhea, unspecified CPT copyright 2019 American [employer] Association. All rights reserved. The codes documented in this report are preliminary and upon coder review may  be revised to meet current compliance requirements. Lionel December, MD Lionel December, MD 12/09/2018 3:17:46 PM This report has been signed electronically. Number of Addenda: 0

## 2018-12-09 NOTE — Discharge Instructions (Signed)
No aspirin or NSAIDs for 24 hours. Take omeprazole 20 mg by mouth 30 minutes before breakfast daily instead of on as-needed basis. Famotidine OTC 20 mg by mouth daily at bedtime. Dicyclomine 10 mg before breakfast and lunch daily. Resume other medications as before. Resume usual diet. Physician will call with biopsy results. Upper abdominal ultrasound to be scheduled.   *****Office will call******  Left message for Carol Thompson coordinator at Dr. Olevia Perches office      Upper Endoscopy, Adult, Care After This sheet gives you information about how to care for yourself after your procedure. Your health care provider may also give you more specific instructions. If you have problems or questions, contact your health care provider. What can I expect after the procedure? After the procedure, it is common to have:  A sore throat.  Mild stomach pain or discomfort.  Bloating.  Nausea. Follow these instructions at home:   Follow instructions from your health care provider about what to eat or drink after your procedure.  Return to your normal activities as told by your health care provider. Ask your health care provider what activities are safe for you.  Take over-the-counter and prescription medicines only as told by your health care provider.  Do not drive for 24 hours if you were given a sedative during your procedure.  Keep all follow-up visits as told by your health care provider. This is important. Contact a health care provider if you have:  A sore throat that lasts longer than one day.  Trouble swallowing. Get help right away if:  You vomit blood or your vomit looks like coffee grounds.  You have: ? A fever. ? Bloody, black, or tarry stools. ? A severe sore throat or you cannot swallow. ? Difficulty breathing. ? Severe pain in your chest or abdomen. Summary  After the procedure, it is common to have a sore throat, mild stomach discomfort, bloating, and nausea.  Do not  drive for 24 hours if you were given a sedative during the procedure.  Follow instructions from your health care provider about what to eat or drink after your procedure.  Return to your normal activities as told by your health care provider. This information is not intended to replace advice given to you by your health care provider. Make sure you discuss any questions you have with your health care provider. Document Released: 08/25/2011 Document Revised: 08/17/2017 Document Reviewed: 07/26/2017 Elsevier Patient Education  2020 [employer].     Colonoscopy, Adult, Care After This sheet gives you information about how to care for yourself after your procedure. Your doctor may also give you more specific instructions. If you have problems or questions, call your doctor. What can I expect after the procedure? After the procedure, it is common to have:  A small amount of blood in your poop for 24 hours.  Some gas.  Mild cramping or bloating in your belly. Follow these instructions at home: General instructions  For the first 24 hours after the procedure: ? Do not drive or use machinery. ? Do not sign important documents. ? Do not drink alcohol. ? Do your daily activities more slowly than normal. ? Eat foods that are soft and easy to digest.  Take over-the-counter or prescription medicines only as told by your doctor. To help cramping and bloating:   Try walking around.  Put heat on your belly (abdomen) as told by your doctor. Use a heat source that your doctor recommends, such as a moist  heat pack or a heating pad. ? Put a towel between your skin and the heat source. ? Leave the heat on for 20-30 minutes. ? Remove the heat if your skin turns bright red. This is especially important if you cannot feel pain, heat, or cold. You can get burned. Eating and drinking   Drink enough fluid to keep your pee (urine) clear or pale yellow.  Return to your normal diet as told by your  doctor. Avoid heavy or fried foods that are hard to digest.  Avoid drinking alcohol for as long as told by your doctor. Contact a doctor if:  You have blood in your poop (stool) 2-3 days after the procedure. Get help right away if:  You have more than a small amount of blood in your poop.  You see large clumps of tissue (blood clots) in your poop.  Your belly is swollen.  You feel sick to your stomach (nauseous).  You throw up (vomit).  You have a fever.  You have belly pain that gets worse, and medicine does not help your pain. Summary  After the procedure, it is common to have a small amount of blood in your poop. You may also have mild cramping and bloating in your belly.  For the first 24 hours after the procedure, do not drive or use machinery, do not sign important documents, and do not drink alcohol.  Get help right away if you have a lot of blood in your poop, feel sick to your stomach, have a fever, or have more belly pain. This information is not intended to replace advice given to you by your health care provider. Make sure you discuss any questions you have with your health care provider. Document Released: 03/28/2010 Document Revised: 12/24/2016 Document Reviewed: 11/18/2015 Elsevier Patient Education  2020 [employer] After These instructions provide you with information about caring for yourself after your procedure. Your health care provider may also give you more specific instructions. Your treatment has been planned according to current medical practices, but problems sometimes occur. Call your health care provider if you have any problems or questions after your procedure. What can I expect after the procedure? After your procedure, you may:  Feel sleepy for several hours.  Feel clumsy and have poor balance for several hours.  Feel forgetful about what happened after the procedure.  Have poor judgment for several  hours.  Feel nauseous or vomit.  Have a sore throat if you had a breathing tube during the procedure. Follow these instructions at home: For at least 24 hours after the procedure:      Have a responsible adult stay with you. It is important to have someone help care for you until you are awake and alert.  Rest as needed.  Do not: ? Participate in activities in which you could fall or become injured. ? Drive. ? Use heavy machinery. ? Drink alcohol. ? Take sleeping pills or medicines that cause drowsiness. ? Make important decisions or sign legal documents. ? Take care of children on your own. Eating and drinking  Follow the diet that is recommended by your health care provider.  If you vomit, drink water, juice, or soup when you can drink without vomiting.  Make sure you have little or no nausea before eating solid foods. General instructions  Take over-the-counter and prescription medicines only as told by your health care provider.  If you have sleep apnea, surgery  and certain medicines can increase your risk for breathing problems. Follow instructions from your health care provider about wearing your sleep device: ? Anytime you are sleeping, including during daytime naps. ? While taking prescription pain medicines, sleeping medicines, or medicines that make you drowsy.  If you smoke, do not smoke without supervision.  Keep all follow-up visits as told by your health care provider. This is important. Contact a health care provider if:  You keep feeling nauseous or you keep vomiting.  You feel light-headed.  You develop a rash.  You have a fever. Get help right away if:  You have trouble breathing. Summary  For several hours after your procedure, you may feel sleepy and have poor judgment.  Have a responsible adult stay with you for at least 24 hours or until you are awake and alert. This information is not intended to replace advice given to you by your  health care provider. Make sure you discuss any questions you have with your health care provider. Document Released: 06/16/2015 Document Revised: 05/24/2017 Document Reviewed: 06/16/2015 Elsevier Patient Education  2020 [employer].

## 2018-12-12 ENCOUNTER — Other Ambulatory Visit (INDEPENDENT_AMBULATORY_CARE_PROVIDER_SITE_OTHER): Payer: Self-pay | Admitting: *Deleted

## 2018-12-12 DIAGNOSIS — G8929 Other chronic pain: Secondary | ICD-10-CM

## 2018-12-13 DIAGNOSIS — M5410 Radiculopathy, site unspecified: Secondary | ICD-10-CM | POA: Diagnosis not present

## 2018-12-13 DIAGNOSIS — M79603 Pain in arm, unspecified: Secondary | ICD-10-CM | POA: Diagnosis not present

## 2018-12-13 DIAGNOSIS — M4802 Spinal stenosis, cervical region: Secondary | ICD-10-CM | POA: Diagnosis not present

## 2018-12-13 LAB — SURGICAL PATHOLOGY

## 2018-12-15 ENCOUNTER — Encounter (HOSPITAL_COMMUNITY): Payer: Self-pay | Admitting: Internal Medicine

## 2018-12-15 ENCOUNTER — Ambulatory Visit (HOSPITAL_COMMUNITY)
Admission: RE | Admit: 2018-12-15 | Discharge: 2018-12-15 | Disposition: A | Discharge status: Home / Self Care | Payer: Medicare PPO | Source: Ambulatory Visit | Attending: Internal Medicine | Admitting: Internal Medicine

## 2018-12-15 ENCOUNTER — Other Ambulatory Visit: Payer: Self-pay

## 2018-12-15 DIAGNOSIS — G8929 Other chronic pain: Secondary | ICD-10-CM | POA: Insufficient documentation

## 2018-12-15 DIAGNOSIS — R1013 Epigastric pain: Secondary | ICD-10-CM | POA: Insufficient documentation

## 2018-12-27 ENCOUNTER — Other Ambulatory Visit (INDEPENDENT_AMBULATORY_CARE_PROVIDER_SITE_OTHER): Payer: Self-pay | Admitting: *Deleted

## 2018-12-27 DIAGNOSIS — R140 Abdominal distension (gaseous): Secondary | ICD-10-CM

## 2018-12-30 DIAGNOSIS — R339 Retention of urine, unspecified: Secondary | ICD-10-CM | POA: Diagnosis not present

## 2019-01-03 ENCOUNTER — Ambulatory Visit (HOSPITAL_COMMUNITY): Payer: Medicare PPO

## 2019-01-30 DIAGNOSIS — R339 Retention of urine, unspecified: Secondary | ICD-10-CM | POA: Diagnosis not present

## 2019-02-10 DIAGNOSIS — M47812 Spondylosis without myelopathy or radiculopathy, cervical region: Secondary | ICD-10-CM | POA: Diagnosis not present

## 2019-02-10 DIAGNOSIS — M47816 Spondylosis without myelopathy or radiculopathy, lumbar region: Secondary | ICD-10-CM | POA: Diagnosis not present

## 2019-02-10 DIAGNOSIS — G90512 Complex regional pain syndrome I of left upper limb: Secondary | ICD-10-CM | POA: Diagnosis not present

## 2019-02-10 DIAGNOSIS — M797 Fibromyalgia: Secondary | ICD-10-CM | POA: Diagnosis not present

## 2019-02-10 DIAGNOSIS — G894 Chronic pain syndrome: Secondary | ICD-10-CM | POA: Diagnosis not present

## 2019-02-16 ENCOUNTER — Ambulatory Visit: Payer: Medicare PPO | Admitting: Neurology

## 2019-03-01 DIAGNOSIS — R339 Retention of urine, unspecified: Secondary | ICD-10-CM | POA: Diagnosis not present

## 2019-03-31 DIAGNOSIS — R339 Retention of urine, unspecified: Secondary | ICD-10-CM | POA: Diagnosis not present

## 2019-05-11 DIAGNOSIS — Z5181 Encounter for therapeutic drug level monitoring: Secondary | ICD-10-CM | POA: Diagnosis not present

## 2019-05-11 DIAGNOSIS — M2550 Pain in unspecified joint: Secondary | ICD-10-CM | POA: Diagnosis not present

## 2019-05-11 DIAGNOSIS — M797 Fibromyalgia: Secondary | ICD-10-CM | POA: Diagnosis not present

## 2019-05-11 DIAGNOSIS — M47812 Spondylosis without myelopathy or radiculopathy, cervical region: Secondary | ICD-10-CM | POA: Diagnosis not present

## 2019-05-11 DIAGNOSIS — G90512 Complex regional pain syndrome I of left upper limb: Secondary | ICD-10-CM | POA: Diagnosis not present

## 2019-05-11 DIAGNOSIS — Z79891 Long term (current) use of opiate analgesic: Secondary | ICD-10-CM | POA: Diagnosis not present

## 2019-05-11 DIAGNOSIS — M47816 Spondylosis without myelopathy or radiculopathy, lumbar region: Secondary | ICD-10-CM | POA: Diagnosis not present

## 2019-05-22 DIAGNOSIS — E559 Vitamin D deficiency, unspecified: Secondary | ICD-10-CM | POA: Diagnosis not present

## 2019-05-22 DIAGNOSIS — I10 Essential (primary) hypertension: Secondary | ICD-10-CM | POA: Diagnosis not present

## 2019-05-22 DIAGNOSIS — R000 Tachycardia, unspecified: Secondary | ICD-10-CM | POA: Diagnosis not present

## 2019-05-22 DIAGNOSIS — M797 Fibromyalgia: Secondary | ICD-10-CM | POA: Diagnosis not present

## 2019-05-22 DIAGNOSIS — R002 Palpitations: Secondary | ICD-10-CM | POA: Diagnosis not present

## 2019-05-26 DIAGNOSIS — R339 Retention of urine, unspecified: Secondary | ICD-10-CM | POA: Diagnosis not present

## 2019-05-29 DIAGNOSIS — Z6829 Body mass index (BMI) 29.0-29.9, adult: Secondary | ICD-10-CM | POA: Diagnosis not present

## 2019-05-29 DIAGNOSIS — M797 Fibromyalgia: Secondary | ICD-10-CM | POA: Diagnosis not present

## 2019-05-29 DIAGNOSIS — I10 Essential (primary) hypertension: Secondary | ICD-10-CM | POA: Diagnosis not present

## 2019-05-29 DIAGNOSIS — G90512 Complex regional pain syndrome I of left upper limb: Secondary | ICD-10-CM | POA: Diagnosis not present

## 2019-06-26 DIAGNOSIS — R339 Retention of urine, unspecified: Secondary | ICD-10-CM | POA: Diagnosis not present

## 2019-07-18 DIAGNOSIS — R5382 Chronic fatigue, unspecified: Secondary | ICD-10-CM | POA: Diagnosis not present

## 2019-07-26 DIAGNOSIS — R339 Retention of urine, unspecified: Secondary | ICD-10-CM | POA: Diagnosis not present

## 2019-07-27 ENCOUNTER — Other Ambulatory Visit: Payer: Medicare PPO

## 2019-08-22 DIAGNOSIS — G90512 Complex regional pain syndrome I of left upper limb: Secondary | ICD-10-CM | POA: Diagnosis not present

## 2019-08-22 DIAGNOSIS — M25559 Pain in unspecified hip: Secondary | ICD-10-CM | POA: Diagnosis not present

## 2019-08-22 DIAGNOSIS — M797 Fibromyalgia: Secondary | ICD-10-CM | POA: Diagnosis not present

## 2019-08-22 DIAGNOSIS — M5416 Radiculopathy, lumbar region: Secondary | ICD-10-CM | POA: Diagnosis not present

## 2019-08-23 DIAGNOSIS — M419 Scoliosis, unspecified: Secondary | ICD-10-CM | POA: Diagnosis not present

## 2019-08-23 DIAGNOSIS — M25552 Pain in left hip: Secondary | ICD-10-CM | POA: Diagnosis not present

## 2019-08-23 DIAGNOSIS — M5416 Radiculopathy, lumbar region: Secondary | ICD-10-CM | POA: Diagnosis not present

## 2019-08-25 DIAGNOSIS — R339 Retention of urine, unspecified: Secondary | ICD-10-CM | POA: Diagnosis not present

## 2019-09-25 DIAGNOSIS — R339 Retention of urine, unspecified: Secondary | ICD-10-CM | POA: Diagnosis not present

## 2019-10-23 ENCOUNTER — Inpatient Hospital Stay (HOSPITAL_COMMUNITY)
Admission: EM | Admit: 2019-10-23 | Discharge: 2019-10-27 | DRG: 389 | Disposition: A | Discharge status: Home / Self Care | Payer: Medicare PPO | Attending: Internal Medicine | Admitting: Internal Medicine

## 2019-10-23 ENCOUNTER — Emergency Department (HOSPITAL_COMMUNITY): Payer: Medicare PPO

## 2019-10-23 ENCOUNTER — Other Ambulatory Visit: Payer: Self-pay

## 2019-10-23 ENCOUNTER — Encounter (HOSPITAL_COMMUNITY): Payer: Self-pay | Admitting: Emergency Medicine

## 2019-10-23 DIAGNOSIS — I10 Essential (primary) hypertension: Secondary | ICD-10-CM | POA: Diagnosis present

## 2019-10-23 DIAGNOSIS — Z841 Family history of disorders of kidney and ureter: Secondary | ICD-10-CM

## 2019-10-23 DIAGNOSIS — K5730 Diverticulosis of large intestine without perforation or abscess without bleeding: Secondary | ICD-10-CM | POA: Diagnosis not present

## 2019-10-23 DIAGNOSIS — Z20822 Contact with and (suspected) exposure to covid-19: Secondary | ICD-10-CM | POA: Diagnosis present

## 2019-10-23 DIAGNOSIS — Z801 Family history of malignant neoplasm of trachea, bronchus and lung: Secondary | ICD-10-CM | POA: Diagnosis not present

## 2019-10-23 DIAGNOSIS — R109 Unspecified abdominal pain: Secondary | ICD-10-CM | POA: Diagnosis present

## 2019-10-23 DIAGNOSIS — N3090 Cystitis, unspecified without hematuria: Secondary | ICD-10-CM

## 2019-10-23 DIAGNOSIS — K5651 Intestinal adhesions [bands], with partial obstruction: Principal | ICD-10-CM | POA: Diagnosis present

## 2019-10-23 DIAGNOSIS — G8929 Other chronic pain: Secondary | ICD-10-CM | POA: Diagnosis present

## 2019-10-23 DIAGNOSIS — J45909 Unspecified asthma, uncomplicated: Secondary | ICD-10-CM | POA: Diagnosis present

## 2019-10-23 DIAGNOSIS — N390 Urinary tract infection, site not specified: Secondary | ICD-10-CM

## 2019-10-23 DIAGNOSIS — Z4659 Encounter for fitting and adjustment of other gastrointestinal appliance and device: Secondary | ICD-10-CM

## 2019-10-23 DIAGNOSIS — Z1619 Resistance to other specified beta lactam antibiotics: Secondary | ICD-10-CM | POA: Diagnosis present

## 2019-10-23 DIAGNOSIS — K3189 Other diseases of stomach and duodenum: Secondary | ICD-10-CM | POA: Diagnosis not present

## 2019-10-23 DIAGNOSIS — K589 Irritable bowel syndrome without diarrhea: Secondary | ICD-10-CM | POA: Diagnosis present

## 2019-10-23 DIAGNOSIS — Z79899 Other long term (current) drug therapy: Secondary | ICD-10-CM

## 2019-10-23 DIAGNOSIS — Z888 Allergy status to other drugs, medicaments and biological substances status: Secondary | ICD-10-CM | POA: Diagnosis not present

## 2019-10-23 DIAGNOSIS — Z881 Allergy status to other antibiotic agents status: Secondary | ICD-10-CM | POA: Diagnosis not present

## 2019-10-23 DIAGNOSIS — R339 Retention of urine, unspecified: Secondary | ICD-10-CM | POA: Diagnosis not present

## 2019-10-23 DIAGNOSIS — B9620 Unspecified Escherichia coli [E. coli] as the cause of diseases classified elsewhere: Secondary | ICD-10-CM | POA: Diagnosis present

## 2019-10-23 DIAGNOSIS — Z820 Family history of epilepsy and other diseases of the nervous system: Secondary | ICD-10-CM | POA: Diagnosis not present

## 2019-10-23 DIAGNOSIS — Z8249 Family history of ischemic heart disease and other diseases of the circulatory system: Secondary | ICD-10-CM

## 2019-10-23 DIAGNOSIS — J9811 Atelectasis: Secondary | ICD-10-CM | POA: Diagnosis not present

## 2019-10-23 DIAGNOSIS — G43909 Migraine, unspecified, not intractable, without status migrainosus: Secondary | ICD-10-CM | POA: Diagnosis present

## 2019-10-23 DIAGNOSIS — K56609 Unspecified intestinal obstruction, unspecified as to partial versus complete obstruction: Secondary | ICD-10-CM | POA: Diagnosis not present

## 2019-10-23 DIAGNOSIS — Z0189 Encounter for other specified special examinations: Secondary | ICD-10-CM

## 2019-10-23 DIAGNOSIS — K429 Umbilical hernia without obstruction or gangrene: Secondary | ICD-10-CM | POA: Diagnosis not present

## 2019-10-23 DIAGNOSIS — N3000 Acute cystitis without hematuria: Secondary | ICD-10-CM | POA: Diagnosis not present

## 2019-10-23 DIAGNOSIS — M797 Fibromyalgia: Secondary | ICD-10-CM | POA: Diagnosis present

## 2019-10-23 DIAGNOSIS — Z90710 Acquired absence of both cervix and uterus: Secondary | ICD-10-CM | POA: Diagnosis not present

## 2019-10-23 DIAGNOSIS — Z8541 Personal history of malignant neoplasm of cervix uteri: Secondary | ICD-10-CM | POA: Diagnosis not present

## 2019-10-23 DIAGNOSIS — K219 Gastro-esophageal reflux disease without esophagitis: Secondary | ICD-10-CM | POA: Diagnosis present

## 2019-10-23 DIAGNOSIS — K6389 Other specified diseases of intestine: Secondary | ICD-10-CM | POA: Diagnosis not present

## 2019-10-23 LAB — COMPREHENSIVE METABOLIC PANEL
ALT: 24 U/L (ref 0–44)
AST: 25 U/L (ref 15–41)
Albumin: 4.3 g/dL (ref 3.5–5.0)
Alkaline Phosphatase: 61 U/L (ref 38–126)
Anion gap: 10 (ref 5–15)
BUN: 22 mg/dL — ABNORMAL HIGH (ref 6–20)
CO2: 24 mmol/L (ref 22–32)
Calcium: 9.5 mg/dL (ref 8.9–10.3)
Chloride: 105 mmol/L (ref 98–111)
Creatinine, Ser: 0.81 mg/dL (ref 0.44–1.00)
GFR calc Af Amer: >60 mL/min (ref >60)
GFR calc non Af Amer: >60 mL/min (ref >60)
Glucose, Bld: 127 mg/dL — ABNORMAL HIGH (ref 70–99)
Potassium: 4.2 mmol/L (ref 3.5–5.1)
Sodium: 139 mmol/L (ref 135–145)
Total Bilirubin: 0.4 mg/dL (ref 0.3–1.2)
Total Protein: 8.7 g/dL — ABNORMAL HIGH (ref 6.5–8.1)

## 2019-10-23 LAB — CBC
HCT: 44.3 % (ref 36.0–46.0)
Hemoglobin: 13.6 g/dL (ref 12.0–15.0)
MCH: 26.4 pg (ref 26.0–34.0)
MCHC: 30.7 g/dL (ref 30.0–36.0)
MCV: 85.9 fL (ref 80.0–100.0)
Platelets: 298 10*3/uL (ref 150–400)
RBC: 5.16 MIL/uL — ABNORMAL HIGH (ref 3.87–5.11)
RDW: 13.6 % (ref 11.5–15.5)
WBC: 10.8 10*3/uL — ABNORMAL HIGH (ref 4.0–10.5)
nRBC: 0 % (ref 0.0–0.2)

## 2019-10-23 LAB — URINALYSIS, ROUTINE W REFLEX MICROSCOPIC
Bilirubin Urine: NEGATIVE
Glucose, UA: NEGATIVE mg/dL
Ketones, ur: NEGATIVE mg/dL
Nitrite: POSITIVE — AB
Protein, ur: NEGATIVE mg/dL
Specific Gravity, Urine: 1.009 (ref 1.005–1.030)
WBC, UA: >50 WBC/hpf — ABNORMAL HIGH (ref 0–5)
pH: 6 (ref 5.0–8.0)

## 2019-10-23 LAB — LIPASE, BLOOD: Lipase: 22 U/L (ref 11–51)

## 2019-10-23 MED ORDER — ONDANSETRON HCL 4 MG/2ML IJ SOLN
4.0000 mg | Freq: Once | INTRAMUSCULAR | Status: AC
  Administered 2019-10-23: 4 mg via INTRAVENOUS
  Filled 2019-10-23: qty 2

## 2019-10-23 MED ORDER — LACTATED RINGERS IV SOLN: INTRAVENOUS | Status: AC

## 2019-10-23 MED ORDER — LACTATED RINGERS IV BOLUS
1000.0000 mL | Freq: Once | INTRAVENOUS | Status: AC
  Administered 2019-10-23: 1000 mL via INTRAVENOUS

## 2019-10-23 MED ORDER — IOHEXOL 300 MG/ML  SOLN
100.0000 mL | Freq: Once | INTRAMUSCULAR | Status: AC | PRN
  Administered 2019-10-23: 100 mL via INTRAVENOUS

## 2019-10-23 MED ORDER — MORPHINE SULFATE (PF) 4 MG/ML IV SOLN
8.0000 mg | INTRAVENOUS | Status: AC | PRN
  Administered 2019-10-23 – 2019-10-24 (×3): 8 mg via INTRAVENOUS
  Filled 2019-10-23 (×3): qty 2

## 2019-10-23 MED ORDER — HYDROMORPHONE HCL 1 MG/ML IJ SOLN
1.0000 mg | INTRAMUSCULAR | Status: AC | PRN
  Administered 2019-10-24 (×2): 1 mg via INTRAVENOUS
  Filled 2019-10-23 (×2): qty 1

## 2019-10-23 MED ORDER — SODIUM CHLORIDE 0.9 % IV SOLN
1.0000 g | Freq: Once | INTRAVENOUS | Status: AC
  Administered 2019-10-23: 1 g via INTRAVENOUS
  Filled 2019-10-23: qty 10

## 2019-10-23 NOTE — ED Triage Notes (Signed)
Pt c/o abd pain with n/v since last night. Pt states she had a bm 2 days ago.

## 2019-10-23 NOTE — ED Provider Notes (Addendum)
[facility] EMERGENCY DEPARTMENT Provider Note   CSN: [id number] Arrival date & time: 10/23/19  1731     History Chief Complaint  Patient presents with  . Abdominal Pain    Carol Thompson is a 54 y.o. female.  HPI   54 year old female comes in a chief complaint of abdominal pain. She has history of interstitial cystitis status post bladder resection and neobladder placement.  She has had bladder infection since then.  She also has had history of hysterectomy and small bowel obstruction.  Patient reports that her current abdominal pain is generalized and started yesterday.  She thinks that the abdomen is distended.  She is not passing flatus anymore.  She has had constant nausea with 2 rounds of emesis.  Review of system is positive for some lower quadrant abdominal pain that she has had in the past with bladder infection.  She has had recurrent bladder infection, many of which resolved on its own.  The current episode is more severe than usual.  Past Medical History:  Diagnosis Date  . Acid reflux   . Asthma   . Cervical cancer ([place])   . Chronic fatigue   . Chronic pain   . Cystitis, interstitial   . Depression   . Fibromyalgia   . Hypertension   . IBS (irritable bowel syndrome)   . Internal hemorrhoids    Colonoscopy 11/11 - Dr. Laural Golden  . Interstitial cystitis   . Migraine   . Pelvic floor dysfunction   . Sleep deprivation   . Vulvodynia     Patient Active Problem List   Diagnosis Date Noted  . Upper abdominal pain 11/24/2018  . Abdominal bloating 11/24/2018  . Hematochezia 11/24/2018  . Partial small bowel obstruction ([place]) 09/03/2015  . Infection of urinary tract 09/03/2015  . Chronic interstitial cystitis 09/03/2015  . Essential hypertension 09/03/2015  . Fibromyalgia 09/03/2015  . Atypical chest pain 06/02/2011  . Bradycardia 05/27/2010  . Essential hypertension, benign 05/27/2010  . Palpitations 05/27/2010    Past Surgical History:  Procedure  Laterality Date  . ABDOMINAL HYSTERECTOMY    . APPENDECTOMY    . BLADDER SURGERY     02/2014 ileal neobladder secondary to severe interstitial cystitis   . COLONOSCOPY WITH PROPOFOL N/A 12/09/2018   Procedure: COLONOSCOPY WITH PROPOFOL;  Surgeon: Rogene Houston, MD;  Location: [facility] ENDO SUITE;  Service: Endoscopy;  Laterality: N/A;  . ESOPHAGOGASTRODUODENOSCOPY (EGD) WITH PROPOFOL N/A 12/09/2018   Procedure: ESOPHAGOGASTRODUODENOSCOPY (EGD) WITH PROPOFOL;  Surgeon: Rogene Houston, MD;  Location: [facility] ENDO SUITE;  Service: Endoscopy;  Laterality: N/A;  140pm     OB History   No obstetric history on file.     Family History  Problem Relation Age of Onset  . Kidney failure Mother        s/p renal transplantation  . Coronary artery disease Father   . Lung cancer Father   . Parkinsonism Brother     Social History   Tobacco Use  . Smoking status: Never Smoker  . Smokeless tobacco: Never Used  Vaping Use  . Vaping Use: Never used  Substance Use Topics  . Alcohol use: No  . Drug use: No    Home Medications Prior to Admission medications   Medication Sig Start Date End Date Taking? Authorizing Provider  albuterol (VENTOLIN HFA) 108 (90 BASE) MCG/ACT inhaler Inhale 2 puffs into the lungs every 6 (six) hours as needed for wheezing or shortness of breath.     [provider]  baclofen (LIORESAL) 20 MG tablet Take 20 mg by mouth 2 (two) times daily.     [provider]  beclomethasone (QVAR) 40 MCG/ACT inhaler Inhale 1 puff into the lungs daily.     [provider]  dicyclomine (BENTYL) 10 MG capsule Take 1 capsule (10 mg total) by mouth 2 (two) times daily before a meal. 12/09/18   Rehman, Mechele Dawley, MD  diltiazem (CARDIZEM) 30 MG tablet Take 30 mg by mouth every evening.     [provider]  DULoxetine (CYMBALTA) 20 MG capsule Take 20 mg by mouth every 3 (three) days.  08/12/18   [provider]  estradiol (ESTRACE) 2 MG tablet Take 2 mg by  mouth every evening.     [provider]  famotidine (PEPCID) 20 MG tablet Take 1 tablet (20 mg total) by mouth at bedtime. 12/09/18   Rehman, Mechele Dawley, MD  fluticasone (FLONASE) 50 MCG/ACT nasal spray Place 1 spray into both nostrils every evening.  01/18/14   [provider]  ibuprofen (ADVIL) 200 MG tablet Take 200 mg by mouth every 8 (eight) hours as needed (pain.).     [provider]  lidocaine (XYLOCAINE) 2 % jelly Place 1 application into the urethra 6 (six) times daily.     [provider]  Melatonin 5 MG CHEW Chew 5 mg by mouth at bedtime.    [provider]  metoprolol (TOPROL-XL) 100 MG 24 hr tablet Take 100 mg by mouth daily.      [provider]  omeprazole (PRILOSEC) 20 MG capsule Take 1 capsule (20 mg total) by mouth daily before breakfast. 12/09/18   Rehman, Mechele Dawley, MD  ondansetron (ZOFRAN) 8 MG tablet Take 8 mg by mouth every 8 (eight) hours as needed for nausea or vomiting.  10/08/18   [provider]  Oxycodone HCl 10 MG TABS Take 10 mg by mouth 2 (two) times daily as needed (breakthrough pain.).  08/25/15   [provider]  promethazine (PHENERGAN) 25 MG tablet Take 25 mg by mouth daily as needed (nausea not resolved by zofran.).    [provider]  XTAMPZA ER 9 MG C12A Take 9 mg by mouth 2 (two) times daily.    [provider]  amitriptyline (ELAVIL) 25 MG tablet Take 25 mg by mouth at bedtime.    06/02/11  [provider]  APAP-Isometheptene-Dichloral ([place]) 325-65-100 MG CAPS As needed    06/02/11  [provider]    Allergies    Ciprofloxacin, Ferric carboxymaltose, and Gabapentin  Review of Systems   Review of Systems  Constitutional: Positive for activity change.  Respiratory: Negative for shortness of breath.   Cardiovascular: Negative for chest pain.  Gastrointestinal: Positive for abdominal pain.  Genitourinary: Negative for flank pain.  All other systems  reviewed and are negative.   Physical Exam Updated Vital Signs BP 126/78   Pulse 96   Temp 98.3 F (36.8 C) (Oral)   Resp (!) 22   Ht 5\' 1"  (1.549 m)   Wt 71.2 kg   SpO2 99%   BMI 29.66 kg/m   Physical Exam Vitals and nursing note reviewed.  Constitutional:      Appearance: She is well-developed.  HENT:     Head: Normocephalic and atraumatic.  Cardiovascular:     Rate and Rhythm: Normal rate.  Pulmonary:     Effort: Pulmonary effort is normal.  Abdominal:     Tenderness: There is generalized  abdominal tenderness. There is no guarding or rebound.  Musculoskeletal:     Cervical back: Normal range of motion and neck supple.  Skin:    General: Skin is warm and dry.  Neurological:     Mental Status: She is alert and oriented to person, place, and time.     ED Results / Procedures / Treatments   Labs (all labs ordered are listed, but only abnormal results are displayed) Labs Reviewed  COMPREHENSIVE METABOLIC PANEL - Abnormal; Notable for the following components:      Result Value   Glucose, Bld 127 (*)    BUN 22 (*)    Total Protein 8.7 (*)    All other components within normal limits  CBC - Abnormal; Notable for the following components:   WBC 10.8 (*)    RBC 5.16 (*)    All other components within normal limits  URINALYSIS, ROUTINE W REFLEX MICROSCOPIC - Abnormal; Notable for the following components:   APPearance CLOUDY (*)    Hgb urine dipstick SMALL (*)    Nitrite POSITIVE (*)    Leukocytes,Ua MODERATE (*)    WBC, UA >50 (*)    Bacteria, UA MANY (*)    Non Squamous Epithelial 0-5 (*)    All other components within normal limits  URINE CULTURE  LIPASE, BLOOD    EKG None  Radiology CT ABDOMEN PELVIS W CONTRAST  Result Date: 10/23/2019 CLINICAL DATA:  Abdominal pain with nausea and vomiting. Bowel obstruction suspected. History of small-bowel obstruction. EXAM: CT ABDOMEN AND PELVIS WITH CONTRAST TECHNIQUE: Multidetector CT imaging of the abdomen and  pelvis was performed using the standard protocol following bolus administration of intravenous contrast. CONTRAST:  161mL OMNIPAQUE IOHEXOL 300 MG/ML  SOLN COMPARISON:  CT 09/03/2015 FINDINGS: Lower chest: Linear atelectasis in the lingula and to a lesser extent lower lobes. No pleural effusion. Hepatobiliary: No focal liver abnormality is seen. No gallstones, gallbladder wall thickening, or biliary dilatation. Pancreas: No ductal dilatation or inflammation. Spleen: Normal in size without focal abnormality. Adrenals/Urinary Tract: No adrenal nodule. No hydronephrosis. No perinephric edema. Mild cortical scarring in the right kidney. There is symmetric excretion on delayed phase imaging. Irregularly-shaped urinary bladder, patient with history of ileal neobladder. This is not significantly changed in appearance from prior exam. Stomach/Bowel: Stomach mildly distended. There are dilated fluid-filled small bowel loops in the lower abdomen and pelvis. Fecalization of small bowel contents with transition point adjacent to enteric sutures in the lower abdomen, series 2, image 60. More distal small bowel is decompressed. There is no pneumatosis or mesenteric edema. Moderate stool burden in the colon. Appendectomy. Occasional sigmoid colonic diverticula without diverticulitis. Vascular/Lymphatic: Normal caliber abdominal aorta. The portal vein is patent. No adenopathy. Reproductive: Status post hysterectomy. No adnexal masses. Other: Trace free fluid in the pelvis. No free air or organized fluid collection. Tiny fat containing umbilical hernia. Musculoskeletal: There are no acute or suspicious osseous abnormalities. IMPRESSION: 1. Low-grade small-bowel obstruction with transition point adjacent to enteric sutures in the lower abdomen, likely due to adhesions. 2. Minimal sigmoid colonic diverticulosis without diverticulitis. Electronically Signed   By: Keith Rake M.D.   On: 10/23/2019 22:44    Procedures Procedures  (including critical care time)  Medications Ordered in ED Medications  morphine 4 MG/ML injection 8 mg (8 mg Intravenous Given 10/23/19 2213)  cefTRIAXone (ROCEPHIN) 1 g in sodium chloride 0.9 % 100 mL IVPB (0 g Intravenous Stopped 10/23/19 2341)  ondansetron (ZOFRAN) injection 4 mg (4 mg Intravenous  Given 10/23/19 2214)  lactated ringers bolus 1,000 mL (0 mLs Intravenous Stopped 10/23/19 2341)  iohexol (OMNIPAQUE) 300 MG/ML solution 100 mL (100 mLs Intravenous Contrast Given 10/23/19 2226)    ED Course  I have reviewed the triage vital signs and the nursing notes.  Pertinent labs & imaging results that were available during my care of the patient were reviewed by me and considered in my medical decision making (see chart for details).  Clinical Course as of Oct 22 2345  Mon Oct 23, 2019  2347 Surgery is aware of the admission.  Hospitalist consult placed.   [AN]    Clinical Course User Index [AN] Varney Biles, MD   MDM Rules/Calculators/A&P                          54 year old female comes in a chief complaint of abdominal pain, nausea, vomiting.  She has history of small bowel obstruction and recurrent bladder infections.  Based on history and exam we have high suspicion for small obstruction.  CT scan of the abdomen has been ordered.  Additionally, patient's urine is nitrite positive and has other markers that are concerning for UTI.  Clinically she is reporting that she is having some lower abdominal discomfort that is consistent with her prior cystitis.  We will give her IV ceftriaxone.  Clinically she is not septic.  Final Clinical Impression(s) / ED Diagnoses Final diagnoses:  SBO (small bowel obstruction) ([place])  Cystitis    Rx / DC Orders ED Discharge Orders    None       Varney Biles, MD 10/23/19 Taliaferro, Amadou Katzenstein, MD 10/23/19 5537

## 2019-10-24 ENCOUNTER — Emergency Department (HOSPITAL_COMMUNITY): Payer: Medicare PPO

## 2019-10-24 ENCOUNTER — Inpatient Hospital Stay (HOSPITAL_COMMUNITY): Payer: Medicare PPO

## 2019-10-24 DIAGNOSIS — M797 Fibromyalgia: Secondary | ICD-10-CM | POA: Diagnosis present

## 2019-10-24 DIAGNOSIS — Z801 Family history of malignant neoplasm of trachea, bronchus and lung: Secondary | ICD-10-CM | POA: Diagnosis not present

## 2019-10-24 DIAGNOSIS — Z90710 Acquired absence of both cervix and uterus: Secondary | ICD-10-CM | POA: Diagnosis not present

## 2019-10-24 DIAGNOSIS — K5651 Intestinal adhesions [bands], with partial obstruction: Secondary | ICD-10-CM | POA: Diagnosis present

## 2019-10-24 DIAGNOSIS — G43909 Migraine, unspecified, not intractable, without status migrainosus: Secondary | ICD-10-CM | POA: Diagnosis present

## 2019-10-24 DIAGNOSIS — J45909 Unspecified asthma, uncomplicated: Secondary | ICD-10-CM | POA: Diagnosis present

## 2019-10-24 DIAGNOSIS — I10 Essential (primary) hypertension: Secondary | ICD-10-CM | POA: Diagnosis present

## 2019-10-24 DIAGNOSIS — Z1619 Resistance to other specified beta lactam antibiotics: Secondary | ICD-10-CM | POA: Diagnosis present

## 2019-10-24 DIAGNOSIS — N3090 Cystitis, unspecified without hematuria: Secondary | ICD-10-CM | POA: Diagnosis not present

## 2019-10-24 DIAGNOSIS — Z820 Family history of epilepsy and other diseases of the nervous system: Secondary | ICD-10-CM | POA: Diagnosis not present

## 2019-10-24 DIAGNOSIS — Z8541 Personal history of malignant neoplasm of cervix uteri: Secondary | ICD-10-CM | POA: Diagnosis not present

## 2019-10-24 DIAGNOSIS — R109 Unspecified abdominal pain: Secondary | ICD-10-CM | POA: Diagnosis present

## 2019-10-24 DIAGNOSIS — Z79899 Other long term (current) drug therapy: Secondary | ICD-10-CM | POA: Diagnosis not present

## 2019-10-24 DIAGNOSIS — Z841 Family history of disorders of kidney and ureter: Secondary | ICD-10-CM | POA: Diagnosis not present

## 2019-10-24 DIAGNOSIS — K219 Gastro-esophageal reflux disease without esophagitis: Secondary | ICD-10-CM

## 2019-10-24 DIAGNOSIS — Z888 Allergy status to other drugs, medicaments and biological substances status: Secondary | ICD-10-CM | POA: Diagnosis not present

## 2019-10-24 DIAGNOSIS — N390 Urinary tract infection, site not specified: Secondary | ICD-10-CM | POA: Diagnosis present

## 2019-10-24 DIAGNOSIS — K589 Irritable bowel syndrome without diarrhea: Secondary | ICD-10-CM | POA: Diagnosis present

## 2019-10-24 DIAGNOSIS — K56609 Unspecified intestinal obstruction, unspecified as to partial versus complete obstruction: Secondary | ICD-10-CM | POA: Diagnosis present

## 2019-10-24 DIAGNOSIS — Z881 Allergy status to other antibiotic agents status: Secondary | ICD-10-CM | POA: Diagnosis not present

## 2019-10-24 DIAGNOSIS — B9620 Unspecified Escherichia coli [E. coli] as the cause of diseases classified elsewhere: Secondary | ICD-10-CM | POA: Diagnosis present

## 2019-10-24 DIAGNOSIS — Z8249 Family history of ischemic heart disease and other diseases of the circulatory system: Secondary | ICD-10-CM | POA: Diagnosis not present

## 2019-10-24 DIAGNOSIS — G8929 Other chronic pain: Secondary | ICD-10-CM | POA: Diagnosis present

## 2019-10-24 DIAGNOSIS — Z20822 Contact with and (suspected) exposure to covid-19: Secondary | ICD-10-CM | POA: Diagnosis present

## 2019-10-24 LAB — PROTIME-INR
INR: 0.9 (ref 0.8–1.2)
Prothrombin Time: 12.1 seconds (ref 11.4–15.2)

## 2019-10-24 LAB — COMPREHENSIVE METABOLIC PANEL
ALT: 19 U/L (ref 0–44)
AST: 19 U/L (ref 15–41)
Albumin: 3.6 g/dL (ref 3.5–5.0)
Alkaline Phosphatase: 52 U/L (ref 38–126)
Anion gap: 11 (ref 5–15)
BUN: 17 mg/dL (ref 6–20)
CO2: 24 mmol/L (ref 22–32)
Calcium: 8.8 mg/dL — ABNORMAL LOW (ref 8.9–10.3)
Chloride: 104 mmol/L (ref 98–111)
Creatinine, Ser: 0.76 mg/dL (ref 0.44–1.00)
GFR calc Af Amer: >60 mL/min (ref >60)
GFR calc non Af Amer: >60 mL/min (ref >60)
Glucose, Bld: 117 mg/dL — ABNORMAL HIGH (ref 70–99)
Potassium: 4.2 mmol/L (ref 3.5–5.1)
Sodium: 139 mmol/L (ref 135–145)
Total Bilirubin: 0.4 mg/dL (ref 0.3–1.2)
Total Protein: 7.4 g/dL (ref 6.5–8.1)

## 2019-10-24 LAB — CBC
HCT: 40.6 % (ref 36.0–46.0)
Hemoglobin: 12.6 g/dL (ref 12.0–15.0)
MCH: 26.7 pg (ref 26.0–34.0)
MCHC: 31 g/dL (ref 30.0–36.0)
MCV: 86 fL (ref 80.0–100.0)
Platelets: 276 10*3/uL (ref 150–400)
RBC: 4.72 MIL/uL (ref 3.87–5.11)
RDW: 14 % (ref 11.5–15.5)
WBC: 11 10*3/uL — ABNORMAL HIGH (ref 4.0–10.5)
nRBC: 0 % (ref 0.0–0.2)

## 2019-10-24 LAB — PHOSPHORUS: Phosphorus: 3.8 mg/dL (ref 2.5–4.6)

## 2019-10-24 LAB — SARS CORONAVIRUS 2 BY RT PCR (HOSPITAL ORDER, PERFORMED IN ~~LOC~~ HOSPITAL LAB): SARS Coronavirus 2: NEGATIVE

## 2019-10-24 LAB — HIV ANTIBODY (ROUTINE TESTING W REFLEX): HIV Screen 4th Generation wRfx: NONREACTIVE

## 2019-10-24 LAB — MAGNESIUM: Magnesium: 1.9 mg/dL (ref 1.7–2.4)

## 2019-10-24 LAB — APTT: aPTT: 31 seconds (ref 24–36)

## 2019-10-24 MED ORDER — MILK AND MOLASSES ENEMA
1.0000 | Freq: Once | RECTAL | Status: AC
  Administered 2019-10-24: 240 mL via RECTAL
  Filled 2019-10-24: qty 240

## 2019-10-24 MED ORDER — ONDANSETRON HCL 4 MG/2ML IJ SOLN
4.0000 mg | Freq: Four times a day (QID) | INTRAMUSCULAR | Status: DC | PRN
  Administered 2019-10-24 – 2019-10-27 (×11): 4 mg via INTRAVENOUS
  Filled 2019-10-24 (×11): qty 2

## 2019-10-24 MED ORDER — HYDROMORPHONE HCL 1 MG/ML IJ SOLN
0.5000 mg | INTRAMUSCULAR | Status: DC | PRN
  Administered 2019-10-24 – 2019-10-25 (×9): 0.5 mg via INTRAVENOUS
  Filled 2019-10-24: qty 0.5
  Filled 2019-10-24: qty 1
  Filled 2019-10-24 (×8): qty 0.5

## 2019-10-24 MED ORDER — LACTATED RINGERS IV SOLN: INTRAVENOUS | Status: DC

## 2019-10-24 MED ORDER — METOPROLOL TARTRATE 5 MG/5ML IV SOLN
5.0000 mg | Freq: Four times a day (QID) | INTRAVENOUS | Status: DC
  Administered 2019-10-24 – 2019-10-27 (×12): 5 mg via INTRAVENOUS
  Filled 2019-10-24 (×12): qty 5

## 2019-10-24 MED ORDER — PROMETHAZINE HCL 25 MG/ML IJ SOLN
25.0000 mg | Freq: Four times a day (QID) | INTRAMUSCULAR | Status: DC | PRN
  Administered 2019-10-24 – 2019-10-27 (×10): 25 mg via INTRAVENOUS
  Filled 2019-10-24 (×10): qty 1

## 2019-10-24 MED ORDER — SODIUM CHLORIDE 0.9 % IV SOLN
1.0000 g | INTRAVENOUS | Status: AC
  Administered 2019-10-24 – 2019-10-25 (×2): 1 g via INTRAVENOUS
  Filled 2019-10-24 (×2): qty 10

## 2019-10-24 MED ORDER — PANTOPRAZOLE SODIUM 40 MG IV SOLR
40.0000 mg | INTRAVENOUS | Status: DC
  Administered 2019-10-24 – 2019-10-26 (×3): 40 mg via INTRAVENOUS
  Filled 2019-10-24 (×3): qty 40

## 2019-10-24 NOTE — H&P (Signed)
History and Physical  Carol Thompson MWN:[id number] DOB: 01-06-1966 DOA: 10/23/2019  Referring physician: Derwood Kaplan, MD PCP: Selinda Flavin, MD  Outpatient Specialists: cardiology Jonelle Sidle) Patient coming from: Home   Chief Complaint: Abdominal pain  HPI: Carol Thompson is a 54 y.o. female with medical history significant for GERD, interstitial cystitis s/p bladder resection and neobladder placement, history of hysterectomy and small bowel obstruction who presents to the emergency department due to generalized abdominal pain which started yesterday in the morning.  Abdominal pain got worse after lunch and she endorsed nonbloody vomiting x2 with several episodes of dry heaves.  Abdominal pain was rated as 10/10 on pain scale with no aggravating/aggravating factor.  She decided to go to the ED for further evaluation and management.  Patient also complained of lower abdominal discomfort that was consistent with her prior cystitis.  ED Course: In the emergency department, BP was 151/103 on arrival, but this eventually improved, other vital signs were within normal range.  Work-up in the ED showed mild leukocytosis, hyperglycemia, urinalysis was positive for UTI.  SARS coronavirus 2 was negative.  CT abdomen and pelvis with contrast showed low-grade small bowel obstruction with transition point adjacent to enteric sutures in the lower abdomen, likely due to adhesions.  Neurosurgery (Dr. Lovell Sheehan) was consulted by ED team and will see patient in the morning.  NG tube was placed.  She was started on IV ceftriaxone, IV morphine was given due to abdominal pain and IV Zofran was given due to vomiting.  Hospitalist was asked to admit patient for further evaluation and management.  Review of Systems: Constitutional: Negative for chills and fever.  HENT: Negative for ear pain and sore throat.   Eyes: Negative for pain and visual disturbance.  Respiratory: Negative for cough, chest tightness  and shortness of breath.   Cardiovascular: Negative for chest pain and palpitations.  Gastrointestinal: Positive for abdominal pain and vomiting.  Endocrine: Negative for polyphagia and polyuria.  Genitourinary: Negative for decreased urine volume, dysuria Musculoskeletal: Negative for arthralgias and back pain.  Skin: Negative for color change and rash.  Allergic/Immunologic: Negative for immunocompromised state.  Neurological: Negative for tremors, syncope, speech difficulty, weakness, light-headedness and headaches.  Hematological: Does not bruise/bleed easily.  All other systems reviewed and are negative  Past Medical History:  Diagnosis Date  . Acid reflux   . Asthma   . Cervical cancer (HCC)   . Chronic fatigue   . Chronic pain   . Cystitis, interstitial   . Depression   . Fibromyalgia   . Hypertension   . IBS (irritable bowel syndrome)   . Internal hemorrhoids    Colonoscopy 11/11 - Dr. Karilyn Cota  . Interstitial cystitis   . Migraine   . Pelvic floor dysfunction   . Sleep deprivation   . Vulvodynia    Past Surgical History:  Procedure Laterality Date  . ABDOMINAL HYSTERECTOMY    . APPENDECTOMY    . BLADDER SURGERY     02/2014 ileal neobladder secondary to severe interstitial cystitis   . COLONOSCOPY WITH PROPOFOL N/A 12/09/2018   Procedure: COLONOSCOPY WITH PROPOFOL;  Surgeon: Malissa Hippo, MD;  Location: [facility];  Service: Endoscopy;  Laterality: N/A;  . ESOPHAGOGASTRODUODENOSCOPY (EGD) WITH PROPOFOL N/A 12/09/2018   Procedure: ESOPHAGOGASTRODUODENOSCOPY (EGD) WITH PROPOFOL;  Surgeon: Malissa Hippo, MD;  Location: [facility];  Service: Endoscopy;  Laterality: N/A;  140pm    Social History:  reports that she has never smoked. She has  never used smokeless tobacco. She reports that she does not drink alcohol and does not use drugs.   Allergies  Allergen Reactions  . Ciprofloxacin Rash  . Ferric Carboxymaltose Hives    Hives in same arm as infusion  predominantly but a few hives in other parts of the body  . Gabapentin Hives    Family History  Problem Relation Age of Onset  . Kidney failure Mother        s/p renal transplantation  . Coronary artery disease Father   . Lung cancer Father   . Parkinsonism Brother     Prior to Admission medications   Medication Sig Start Date End Date Taking? Authorizing Provider  albuterol (VENTOLIN HFA) 108 (90 BASE) MCG/ACT inhaler Inhale 2 puffs into the lungs every 6 (six) hours as needed for wheezing or shortness of breath.     [provider]  baclofen (LIORESAL) 20 MG tablet Take 20 mg by mouth 2 (two) times daily.     [provider]  beclomethasone (QVAR) 40 MCG/ACT inhaler Inhale 1 puff into the lungs daily.     [provider]  dicyclomine (BENTYL) 10 MG capsule Take 1 capsule (10 mg total) by mouth 2 (two) times daily before a meal. 12/09/18   Rehman, Joline Maxcy, MD  diltiazem (CARDIZEM) 30 MG tablet Take 30 mg by mouth every evening.     [provider]  DULoxetine (CYMBALTA) 20 MG capsule Take 20 mg by mouth every 3 (three) days.  08/12/18   [provider]  estradiol (ESTRACE) 2 MG tablet Take 2 mg by mouth every evening.     [provider]  famotidine (PEPCID) 20 MG tablet Take 1 tablet (20 mg total) by mouth at bedtime. 12/09/18   Rehman, Joline Maxcy, MD  fluticasone (FLONASE) 50 MCG/ACT nasal spray Place 1 spray into both nostrils every evening.  01/18/14   [provider]  ibuprofen (ADVIL) 200 MG tablet Take 200 mg by mouth every 8 (eight) hours as needed (pain.).     [provider]  lidocaine (XYLOCAINE) 2 % jelly Place 1 application into the urethra 6 (six) times daily.     [provider]  Melatonin 5 MG CHEW Chew 5 mg by mouth at bedtime.    [provider]  metoprolol (TOPROL-XL) 100 MG 24 hr tablet Take 100 mg by mouth daily.      [provider]  omeprazole (PRILOSEC) 20 MG capsule Take  1 capsule (20 mg total) by mouth daily before breakfast. 12/09/18   Rehman, Joline Maxcy, MD  ondansetron (ZOFRAN) 8 MG tablet Take 8 mg by mouth every 8 (eight) hours as needed for nausea or vomiting.  10/08/18   [provider]  Oxycodone HCl 10 MG TABS Take 10 mg by mouth 2 (two) times daily as needed (breakthrough pain.).  08/25/15   [provider]  promethazine (PHENERGAN) 25 MG tablet Take 25 mg by mouth daily as needed (nausea not resolved by zofran.).    [provider]  XTAMPZA ER 9 MG C12A Take 9 mg by mouth 2 (two) times daily.    [provider]  amitriptyline (ELAVIL) 25 MG tablet Take 25 mg by mouth at bedtime.    06/02/11  [provider]  APAP-Isometheptene-Dichloral (MIDRIN) 325-65-100 MG CAPS As needed    06/02/11  [provider]    Physical Exam: BP 126/78   Pulse 96   Temp 98.3 F (36.8 C) (Oral)  Resp (!) 22   Ht 5\' 1"  (1.549 m)   Wt 71.2 kg   SpO2 99%   BMI 29.66 kg/m   . General: 54 y.o. year-old female well developed well nourished in no acute distress.  Alert and oriented x3. Marland Kitchen HEENT: Normocephalic, atraumatic . Neck: Supple, trachea midline . Cardiovascular: Regular rate and rhythm with no rubs or gallops.  No thyromegaly or JVD noted.  No lower extremity edema. 2/4 pulses in all 4 extremities. Marland Kitchen Respiratory: Clear to auscultation with no wheezes or rales. Good inspiratory effort. . Abdomen: Soft, tender to palpation with no guarding.   . Muskuloskeletal: No cyanosis, clubbing or edema noted bilaterally . Neuro: CN II-XII intact, strength, sensation, reflexes . Skin: No ulcerative lesions noted or rashes . Psychiatry: Judgement and insight appear normal. Mood is appropriate for condition and setting          Labs on Admission:  Basic Metabolic Panel: Recent Labs  Lab 10/23/19 1950 10/24/19 0555  NA 139 139  K 4.2 4.2  CL 105 104  CO2 24 24  GLUCOSE 127* 117*  BUN 22* 17  CREATININE 0.81 0.76   CALCIUM 9.5 8.8*  MG  --  1.9  PHOS  --  3.8   Liver Function Tests: Recent Labs  Lab 10/23/19 1950 10/24/19 0555  AST 25 19  ALT 24 19  ALKPHOS 61 52  BILITOT 0.4 0.4  PROT 8.7* 7.4  ALBUMIN 4.3 3.6   Recent Labs  Lab 10/23/19 1950  LIPASE 22   No results for input(s): AMMONIA in the last 168 hours. CBC: Recent Labs  Lab 10/23/19 1950 10/24/19 0555  WBC 10.8* 11.0*  HGB 13.6 12.6  HCT 44.3 40.6  MCV 85.9 86.0  PLT 298 276   Cardiac Enzymes: No results for input(s): CKTOTAL, CKMB, CKMBINDEX, TROPONINI in the last 168 hours.  BNP (last 3 results) No results for input(s): BNP in the last 8760 hours.  ProBNP (last 3 results) No results for input(s): PROBNP in the last 8760 hours.  CBG: No results for input(s): GLUCAP in the last 168 hours.  Radiological Exams on Admission: DG Chest 1 View  Result Date: 10/24/2019 CLINICAL DATA:  NG tube placement. EXAM: CHEST  1 VIEW COMPARISON:  None. FINDINGS: The tip of the enteric tube is at the gastroesophageal junction, side-port in the mid esophagus. Recommend advancement of at least 9 cm for optimal placement. Low lung volumes with minor bibasilar atelectasis. Heart is normal in size. IMPRESSION: Tip of the enteric tube at the gastroesophageal junction, side-port in the mid esophagus. Recommend advancement of at least 9 cm for optimal placement. Electronically Signed   By: Narda Rutherford M.D.   On: 10/24/2019 02:03   CT ABDOMEN PELVIS W CONTRAST  Result Date: 10/23/2019 CLINICAL DATA:  Abdominal pain with nausea and vomiting. Bowel obstruction suspected. History of small-bowel obstruction. EXAM: CT ABDOMEN AND PELVIS WITH CONTRAST TECHNIQUE: Multidetector CT imaging of the abdomen and pelvis was performed using the standard protocol following bolus administration of intravenous contrast. CONTRAST:  OMNIPAQUE IOHEXOL 300 MG/ML  SOLN COMPARISON:  CT 09/03/2015 FINDINGS: Lower chest: Linear atelectasis in the lingula  and to a lesser extent lower lobes. No pleural effusion. Hepatobiliary: No focal liver abnormality is seen. No gallstones, gallbladder wall thickening, or biliary dilatation. Pancreas: No ductal dilatation or inflammation. Spleen: Normal in size without focal abnormality. Adrenals/Urinary Tract: No adrenal nodule. No hydronephrosis. No perinephric edema. Mild cortical scarring in the right kidney. There  is symmetric excretion on delayed phase imaging. Irregularly-shaped urinary bladder, patient with history of ileal neobladder. This is not significantly changed in appearance from prior exam. Stomach/Bowel: Stomach mildly distended. There are dilated fluid-filled small bowel loops in the lower abdomen and pelvis. Fecalization of small bowel contents with transition point adjacent to enteric sutures in the lower abdomen, series 2, image 60. More distal small bowel is decompressed. There is no pneumatosis or mesenteric edema. Moderate stool burden in the colon. Appendectomy. Occasional sigmoid colonic diverticula without diverticulitis. Vascular/Lymphatic: Normal caliber abdominal aorta. The portal vein is patent. No adenopathy. Reproductive: Status post hysterectomy. No adnexal masses. Other: Trace free fluid in the pelvis. No free air or organized fluid collection. Tiny fat containing umbilical hernia. Musculoskeletal: There are no acute or suspicious osseous abnormalities. IMPRESSION: 1. Low-grade small-bowel obstruction with transition point adjacent to enteric sutures in the lower abdomen, likely due to adhesions. 2. Minimal sigmoid colonic diverticulosis without diverticulitis. Electronically Signed   By: Narda Rutherford M.D.   On: 10/23/2019 22:44   DG Chest Port 1V same Day  Result Date: 10/24/2019 CLINICAL DATA:  NG tube readjustment. EXAM: PORTABLE CHEST 1 VIEW COMPARISON:  Earlier this day. FINDINGS: The enteric tube has been advanced. The tip of the enteric tube is below the diaphragm, the side port is  just beyond the gastroesophageal junction. Dilated small bowel in the upper abdomen. Mild bibasilar atelectasis. IMPRESSION: 1. Enteric tube tip below the diaphragm, side port just beyond the gastroesophageal junction. 2. Dilated small bowel in the upper abdomen. Electronically Signed   By: Narda Rutherford M.D.   On: 10/24/2019 02:45    EKG: I independently viewed the EKG done and my findings are as followed: No EKG was done in the ED  Assessment/Plan Present on Admission: . Small bowel obstruction (HCC) . Essential hypertension, benign  Principal Problem:   Small bowel obstruction (HCC) Active Problems:   Essential hypertension, benign   Acute lower UTI   GERD (gastroesophageal reflux disease)   Small bowel obstruction CT abdomen and pelvis with contrast showed low-grade small bowel obstruction with transition point adjacent to enteric sutures in the lower abdomen, likely due to adhesions. Continue NG tube  Continue NPO at this time with plan to advance diet as tolerated Continue IV hydration Continue IV Dilaudid 1 mg q.4h p.r.n. for severe pain Continue Zofran p.r.n. for nausea/vomiting General surgery was consulted and will see patient in the morning  UTI POA Patient with history of recurrent UTI due to interstitial cystitis  s/p bladder resection and neobladder placement Chief complaint of lower abdominal discomfort consistent with prior cystitis Urinalysis was positive for nitrite, moderate leukocytes, many bacteria She was started on IV ceftriaxone, we shall continue with same at this time Urine culture pending  Essential hypertension (moderately controlled) Continue to monitor BP and treat accordingly  GERD Continue Protonix  DVT prophylaxis: SCDs, (No indication for chemoprophylaxis at this time due to possible surgical intervention in the morning)  Code Status: Full code  Family Communication: Husband at bedside (all questions answered to  satisfaction)  Disposition Plan:  Patient is from:                        home Anticipated DC to:                   SNF or family members home Anticipated DC date:  2-3 days Anticipated DC barriers:          Patient needs surgical evaluation considering small bowel obstruction   Consults called: General surgery  Admission status: Inpatient    Frankey Shown MD [facility]  If 7PM-7AM, please contact night-coverage [email or web address] Password [facility]  10/24/2019, 6:46 AM

## 2019-10-24 NOTE — Consult Note (Signed)
Reason for Consult: Small bowel obstruction Referring Physician: Dr. Jolyn Nap is an 54 y.o. female.  HPI: Patient is a 54 year old white female with history of interstitial cystitis, status post bladder resection and neobladder placement in 2015 and hysterectomy who presents with a 24-hour history of worsening abdominal pain, distention, and vomiting.  She states she has not had a bowel movement in several days.  Her last episode of a small bowel obstruction was in 2017 and this resolved without surgery or NG tube placement.  She states she does not remember when she last passed gas.  She presented to the emergency room and a CT scan of the abdomen revealed a mild early partial small bowel obstruction secondary to adhesive disease.  There is some fecalization of the terminal ileum as well as stool load in the colon.  She does have to straight cath herself 4 times a day due to her neobladder.  She has had NG tube placed and states that she does feel somewhat better.  She primarily complains of upper abdominal discomfort.  Past Medical History:  Diagnosis Date  . Acid reflux   . Asthma   . Cervical cancer ([place])   . Chronic fatigue   . Chronic pain   . Cystitis, interstitial   . Depression   . Fibromyalgia   . Hypertension   . IBS (irritable bowel syndrome)   . Internal hemorrhoids    Colonoscopy 11/11 - Dr. Laural Golden  . Interstitial cystitis   . Migraine   . Pelvic floor dysfunction   . Sleep deprivation   . Vulvodynia     Past Surgical History:  Procedure Laterality Date  . ABDOMINAL HYSTERECTOMY    . APPENDECTOMY    . BLADDER SURGERY     02/2014 ileal neobladder secondary to severe interstitial cystitis   . COLONOSCOPY WITH PROPOFOL N/A 12/09/2018   Procedure: COLONOSCOPY WITH PROPOFOL;  Surgeon: Rogene Houston, MD;  [facility];  Service: Endoscopy;  Laterality: N/A;  . ESOPHAGOGASTRODUODENOSCOPY (EGD) WITH PROPOFOL N/A 12/09/2018   Procedure:  ESOPHAGOGASTRODUODENOSCOPY (EGD) WITH PROPOFOL;  Surgeon: Rogene Houston, MD;  Location: [facility];  Service: Endoscopy;  Laterality: N/A;  140pm    Family History  Problem Relation Age of Onset  . Kidney failure Mother        s/p renal transplantation  . Coronary artery disease Father   . Lung cancer Father   . Parkinsonism Brother     Social History:  reports that she has never smoked. She has never used smokeless tobacco. She reports that she does not drink alcohol and does not use drugs.  Allergies:  Allergies  Allergen Reactions  . Ciprofloxacin Rash  . Ferric Carboxymaltose Hives    Hives in same arm as infusion predominantly but a few hives in other parts of the body  . Gabapentin Hives    Medications: I have reviewed the patient's current medications.  Results for orders placed or performed during the hospital encounter of 10/23/19 (from the past 48 hour(s))  SARS Coronavirus 2 by RT PCR (hospital order, performed in [facility] hospital lab) Nasopharyngeal Nasopharyngeal Swab     Status: None   Collection Time: 10/23/19 12:10 AM   Specimen: Nasopharyngeal Swab  Result Value Ref Range   SARS Coronavirus 2 NEGATIVE NEGATIVE    Comment: (NOTE) SARS-CoV-2 target nucleic acids are NOT DETECTED.  The SARS-CoV-2 RNA is generally detectable in upper and lower respiratory specimens during the acute phase  of infection. The lowest concentration of SARS-CoV-2 viral copies this assay can detect is 250 copies / mL. A negative result does not preclude SARS-CoV-2 infection and should not be used as the sole basis for treatment or other patient management decisions.  A negative result may occur with improper specimen collection / handling, submission of specimen other than nasopharyngeal swab, presence of viral mutation(s) within the areas targeted by this assay, and inadequate number of viral copies (<250 copies / mL). A negative result must be combined with  clinical observations, patient history, and epidemiological information.  Fact Sheet for Patients:   StrictlyIdeas.no  Fact Sheet for Healthcare Providers: BankingDealers.co.za  This test is not yet approved or  cleared by the [place] FDA and has been authorized for detection and/or diagnosis of SARS-CoV-2 by FDA under an Emergency Use Authorization (EUA).  This EUA will remain in effect (meaning this test can be used) for the duration of the COVID-19 declaration under Section 564(b)(1) of the Act, 21 U.S.C. section 360bbb-3(b)(1), unless the authorization is terminated or revoked sooner.  Performed at [facility], [address] [place], [place] [address]   Lipase, blood     Status: None   Collection Time: 10/23/19  7:50 PM  Result Value Ref Range   Lipase 22 11 - 51 U/L    Comment: Performed at [facility], [address], [place], [place] [address]  Comprehensive metabolic panel     Status: Abnormal   Collection Time: 10/23/19  7:50 PM  Result Value Ref Range   Sodium 139 135 - 145 mmol/L   Potassium 4.2 3.5 - 5.1 mmol/L   Chloride 105 98 - 111 mmol/L   CO2 24 22 - 32 mmol/L   Glucose, Bld 127 (H) 70 - 99 mg/dL    Comment: Glucose reference range applies only to samples taken after fasting for at least 8 hours.   BUN 22 (H) 6 - 20 mg/dL   Creatinine, Ser 0.81 0.44 - 1.00 mg/dL   Calcium 9.5 8.9 - 10.3 mg/dL   Total Protein 8.7 (H) 6.5 - 8.1 g/dL   Albumin 4.3 3.5 - 5.0 g/dL   AST 25 15 - 41 U/L   ALT 24 0 - 44 U/L   Alkaline Phosphatase 61 38 - 126 U/L   Total Bilirubin 0.4 0.3 - 1.2 mg/dL   GFR calc non Af Amer >60 >60 mL/min   GFR calc Af Amer >60 >60 mL/min   Anion gap 10 5 - 15    Comment: Performed at [facility], [address] [place], [place] [address]  CBC     Status: Abnormal   Collection Time: 10/23/19  7:50 PM  Result Value Ref Range   WBC 10.8 (H) 4.0 - 10.5 K/uL   RBC 5.16 (H) 3.87 - 5.11 MIL/uL    Hemoglobin 13.6 12.0 - 15.0 g/dL   HCT 44.3 36 - 46 %   MCV 85.9 80.0 - 100.0 fL   MCH 26.4 26.0 - 34.0 pg   MCHC 30.7 30.0 - 36.0 g/dL   RDW 13.6 11.5 - 15.5 %   Platelets 298 150 - 400 K/uL   nRBC 0.0 0.0 - 0.2 %    Comment: Performed at [facility], [address] [place], [place] [address]  Urinalysis, Routine w reflex microscopic Urine, Clean Catch     Status: Abnormal   Collection Time: 10/23/19  9:00 PM  Result Value Ref Range   Color, Urine YELLOW YELLOW  APPearance CLOUDY (A) CLEAR   Specific Gravity, Urine 1.009 1.005 - 1.030   pH 6.0 5.0 - 8.0   Glucose, UA NEGATIVE NEGATIVE mg/dL   Hgb urine dipstick SMALL (A) NEGATIVE   Bilirubin Urine NEGATIVE NEGATIVE   Ketones, ur NEGATIVE NEGATIVE mg/dL   Protein, ur NEGATIVE NEGATIVE mg/dL   Nitrite POSITIVE (A) NEGATIVE   Leukocytes,Ua MODERATE (A) NEGATIVE   RBC / HPF 11-20 0 - 5 RBC/hpf   WBC, UA >50 (H) 0 - 5 WBC/hpf   Bacteria, UA MANY (A) NONE SEEN   Squamous Epithelial / LPF 11-20 0 - 5   WBC Clumps PRESENT    Mucus PRESENT    Non Squamous Epithelial 0-5 (A) NONE SEEN    Comment: Performed at [facility], [address] [place], [place] [address]  Comprehensive metabolic panel     Status: Abnormal   Collection Time: 10/24/19  5:55 AM  Result Value Ref Range   Sodium 139 135 - 145 mmol/L   Potassium 4.2 3.5 - 5.1 mmol/L   Chloride 104 98 - 111 mmol/L   CO2 24 22 - 32 mmol/L   Glucose, Bld 117 (H) 70 - 99 mg/dL    Comment: Glucose reference range applies only to samples taken after fasting for at least 8 hours.   BUN 17 6 - 20 mg/dL   Creatinine, Ser 0.76 0.44 - 1.00 mg/dL   Calcium 8.8 (L) 8.9 - 10.3 mg/dL   Total Protein 7.4 6.5 - 8.1 g/dL   Albumin 3.6 3.5 - 5.0 g/dL   AST 19 15 - 41 U/L   ALT 19 0 - 44 U/L   Alkaline Phosphatase 52 38 - 126 U/L   Total Bilirubin 0.4 0.3 - 1.2 mg/dL   GFR calc non Af Amer >60 >60 mL/min   GFR calc Af Amer >60 >60 mL/min   Anion gap 11 5 - 15    Comment: Performed at  [facility], [address] [place], [place] [address]  CBC     Status: Abnormal   Collection Time: 10/24/19  5:55 AM  Result Value Ref Range   WBC 11.0 (H) 4.0 - 10.5 K/uL   RBC 4.72 3.87 - 5.11 MIL/uL   Hemoglobin 12.6 12.0 - 15.0 g/dL   HCT 40.6 36 - 46 %   MCV 86.0 80.0 - 100.0 fL   MCH 26.7 26.0 - 34.0 pg   MCHC 31.0 30.0 - 36.0 g/dL   RDW 14.0 11.5 - 15.5 %   Platelets 276 150 - 400 K/uL   nRBC 0.0 0.0 - 0.2 %    Comment: Performed at [facility], [address], [place], [place] [address]  Protime-INR     Status: None   Collection Time: 10/24/19  5:55 AM  Result Value Ref Range   Prothrombin Time 12.1 11.4 - 15.2 seconds   INR 0.9 0.8 - 1.2    Comment: (NOTE) INR goal varies based on device and disease states. Performed at [facility], [address], [place], [place] [address]   APTT     Status: None   Collection Time: 10/24/19  5:55 AM  Result Value Ref Range   aPTT 31 24 - 36 seconds    Comment: Performed at [facility], [address] [place], [place] [address]  Magnesium     Status: None   Collection Time: 10/24/19  5:55 AM  Result Value Ref Range   Magnesium 1.9 1.7 - 2.4 mg/dL  Comment: Performed at [facility], [address], [place], [place] [address]  Phosphorus     Status: None   Collection Time: 10/24/19  5:55 AM  Result Value Ref Range   Phosphorus 3.8 2.5 - 4.6 mg/dL    Comment: Performed at [facility], [address], [place], [place] [address]    DG Chest 1 View  Result Date: 10/24/2019 CLINICAL DATA:  NG tube placement. EXAM: CHEST  1 VIEW COMPARISON:  None. FINDINGS: The tip of the enteric tube is at the gastroesophageal junction, side-port in the mid esophagus. Recommend advancement of at least 9 cm for optimal placement. Low lung volumes with minor bibasilar atelectasis. Heart is normal in size. IMPRESSION: Tip of the enteric tube at the gastroesophageal junction, side-port in the mid esophagus. Recommend advancement of at least 9 cm for  optimal placement. Electronically Signed   By: Keith Rake M.D.   On: 10/24/2019 02:03   CT ABDOMEN PELVIS W CONTRAST  Result Date: 10/23/2019 CLINICAL DATA:  Abdominal pain with nausea and vomiting. Bowel obstruction suspected. History of small-bowel obstruction. EXAM: CT ABDOMEN AND PELVIS WITH CONTRAST TECHNIQUE: Multidetector CT imaging of the abdomen and pelvis was performed using the standard protocol following bolus administration of intravenous contrast. CONTRAST:  121mL OMNIPAQUE IOHEXOL 300 MG/ML  SOLN COMPARISON:  CT 09/03/2015 FINDINGS: Lower chest: Linear atelectasis in the lingula and to a lesser extent lower lobes. No pleural effusion. Hepatobiliary: No focal liver abnormality is seen. No gallstones, gallbladder wall thickening, or biliary dilatation. Pancreas: No ductal dilatation or inflammation. Spleen: Normal in size without focal abnormality. Adrenals/Urinary Tract: No adrenal nodule. No hydronephrosis. No perinephric edema. Mild cortical scarring in the right kidney. There is symmetric excretion on delayed phase imaging. Irregularly-shaped urinary bladder, patient with history of ileal neobladder. This is not significantly changed in appearance from prior exam. Stomach/Bowel: Stomach mildly distended. There are dilated fluid-filled small bowel loops in the lower abdomen and pelvis. Fecalization of small bowel contents with transition point adjacent to enteric sutures in the lower abdomen, series 2, image 60. More distal small bowel is decompressed. There is no pneumatosis or mesenteric edema. Moderate stool burden in the colon. Appendectomy. Occasional sigmoid colonic diverticula without diverticulitis. Vascular/Lymphatic: Normal caliber abdominal aorta. The portal vein is patent. No adenopathy. Reproductive: Status post hysterectomy. No adnexal masses. Other: Trace free fluid in the pelvis. No free air or organized fluid collection. Tiny fat containing umbilical hernia.  Musculoskeletal: There are no acute or suspicious osseous abnormalities. IMPRESSION: 1. Low-grade small-bowel obstruction with transition point adjacent to enteric sutures in the lower abdomen, likely due to adhesions. 2. Minimal sigmoid colonic diverticulosis without diverticulitis. Electronically Signed   By: Keith Rake M.D.   On: 10/23/2019 22:44   DG Chest Port 1V same Day  Result Date: 10/24/2019 CLINICAL DATA:  NG tube readjustment. EXAM: PORTABLE CHEST 1 VIEW COMPARISON:  Earlier this day. FINDINGS: The enteric tube has been advanced. The tip of the enteric tube is below the diaphragm, the side port is just beyond the gastroesophageal junction. Dilated small bowel in the upper abdomen. Mild bibasilar atelectasis. IMPRESSION: 1. Enteric tube tip below the diaphragm, side port just beyond the gastroesophageal junction. 2. Dilated small bowel in the upper abdomen. Electronically Signed   By: Keith Rake M.D.   On: 10/24/2019 02:45    ROS:  Pertinent items are noted in HPI.  Blood pressure 126/78, pulse 96, temperature 98.3 F (36.8 C), temperature source Oral, resp. rate (!) 22,  height 5\' 1"  (1.549 m), weight 71.2 kg, SpO2 99 %. Physical Exam: Pleasant well-developed well-nourished white female no acute distress Head is normocephalic, atraumatic Lungs are clear to auscultation with equal breath sounds bilaterally Heart examination reveals a regular rate and rhythm without S3, S4, murmurs Abdomen is soft and slightly distended.  No rigidity is noted.  Occasional bowel sounds are appreciated.  CT scan images personally reviewed  Assessment/Plan: Impression: Partial small bowel obstruction most likely secondary to adhesive disease from her previous abdominal surgeries Neobladder with need for multiple self catheterizations Plan: Would continue NG tube decompression for now.  No need for acute surgical intervention at this time.  Will order enema due to the stool load in her colon.   Will follow with you. Aviva Signs 10/24/2019, 8:26 AM

## 2019-10-24 NOTE — Progress Notes (Signed)
Per HPI: Carol Thompson is a 54 y.o. female with medical history significant for GERD, interstitial cystitis s/p bladder resection and neobladder placement, history of hysterectomy and small bowel obstruction who presents to the emergency department due to generalized abdominal pain which started yesterday in the morning.  Abdominal pain got worse after lunch and she endorsed nonbloody vomiting x2 with several episodes of dry heaves.  Abdominal pain was rated as 10/10 on pain scale with no aggravating/aggravating factor.  She decided to go to the ED for further evaluation and management.  Patient also complained of lower abdominal discomfort that was consistent with her prior cystitis.  -Patient admitted after midnight with partial small bowel obstruction and seen by general surgery with plans to continue NG tube to low intermittent suction today as well as antiemetics.  I have seen and evaluated the patient at bedside.  Continue IV fluid as ordered and monitor repeat labs.  Patient continues to have some nausea and vomiting despite Zofran use and I will add Phenergan to assist with her symptomatology.  She is also noted to have UTI and is currently on Rocephin.  Urine culture is currently pending.  I will add IV PPI daily as well as IV metoprolol scheduled as she cannot take any of her other oral medications.  Total care time: 30 minutes.

## 2019-10-24 NOTE — ED Notes (Signed)
Report called to [facility], RN on 300.

## 2019-10-25 DIAGNOSIS — N390 Urinary tract infection, site not specified: Secondary | ICD-10-CM

## 2019-10-25 DIAGNOSIS — K219 Gastro-esophageal reflux disease without esophagitis: Secondary | ICD-10-CM

## 2019-10-25 DIAGNOSIS — I10 Essential (primary) hypertension: Secondary | ICD-10-CM

## 2019-10-25 LAB — COMPREHENSIVE METABOLIC PANEL
ALT: 20 U/L (ref 0–44)
AST: 18 U/L (ref 15–41)
Albumin: 3.6 g/dL (ref 3.5–5.0)
Alkaline Phosphatase: 55 U/L (ref 38–126)
Anion gap: 8 (ref 5–15)
BUN: 15 mg/dL (ref 6–20)
CO2: 27 mmol/L (ref 22–32)
Calcium: 8.7 mg/dL — ABNORMAL LOW (ref 8.9–10.3)
Chloride: 103 mmol/L (ref 98–111)
Creatinine, Ser: 0.75 mg/dL (ref 0.44–1.00)
GFR calc Af Amer: >60 mL/min (ref >60)
GFR calc non Af Amer: >60 mL/min (ref >60)
Glucose, Bld: 99 mg/dL (ref 70–99)
Potassium: 3.7 mmol/L (ref 3.5–5.1)
Sodium: 138 mmol/L (ref 135–145)
Total Bilirubin: 0.4 mg/dL (ref 0.3–1.2)
Total Protein: 7.2 g/dL (ref 6.5–8.1)

## 2019-10-25 LAB — CBC
HCT: 39.4 % (ref 36.0–46.0)
Hemoglobin: 12.2 g/dL (ref 12.0–15.0)
MCH: 26.7 pg (ref 26.0–34.0)
MCHC: 31 g/dL (ref 30.0–36.0)
MCV: 86.2 fL (ref 80.0–100.0)
Platelets: 246 10*3/uL (ref 150–400)
RBC: 4.57 MIL/uL (ref 3.87–5.11)
RDW: 14 % (ref 11.5–15.5)
WBC: 9.4 10*3/uL (ref 4.0–10.5)
nRBC: 0 % (ref 0.0–0.2)

## 2019-10-25 LAB — MAGNESIUM: Magnesium: 2 mg/dL (ref 1.7–2.4)

## 2019-10-25 MED ORDER — ROPINIROLE HCL 0.25 MG PO TABS: 0.2500 mg | ORAL_TABLET | Freq: Every day | ORAL | Status: DC

## 2019-10-25 MED ORDER — BACLOFEN 10 MG PO TABS
10.0000 mg | ORAL_TABLET | Freq: Once | ORAL | Status: DC
  Filled 2019-10-25: qty 1

## 2019-10-25 MED ORDER — MORPHINE SULFATE (PF) 2 MG/ML IV SOLN
1.0000 mg | INTRAVENOUS | Status: DC | PRN
  Administered 2019-10-25 – 2019-10-27 (×10): 1 mg via INTRAVENOUS
  Filled 2019-10-25 (×10): qty 1

## 2019-10-25 NOTE — Progress Notes (Signed)
Subjective: Patient states she did have a bowel movement with the enema.  She is passing flatus.  Her NG tube fell out and she did feel nauseated, but has since been placed back in.  Objective: Vital signs in last 24 hours: Temp:  [99.1 F (37.3 C)-99.6 F (37.6 C)] 99.1 F (37.3 C) (08/18 0540) Pulse Rate:  [87-97] 89 (08/18 0540) Resp:  [20] 20 (08/18 0540) BP: (132-148)/(81-96) 142/84 (08/18 0540) SpO2:  [94 %-99 %] 96 % (08/18 0930) Weight:  [70 kg] 70 kg (08/17 1659)    Intake/Output from previous day: 08/17 0701 - 08/18 0700 In: 1144.6 [I.V.:1043.8; IV Piggyback:100.7] Out: 1000 [Urine:1000] Intake/Output this shift: No intake/output data recorded.  General appearance: alert, cooperative and no distress GI: Soft, nontender, nondistended.  Occasional bowel sounds appreciated.  Lab Results:  Recent Labs    10/24/19 0555 10/25/19 0721  WBC 11.0* 9.4  HGB 12.6 12.2  HCT 40.6 39.4  PLT 276 246   BMET Recent Labs    10/24/19 0555 10/25/19 0721  NA 139 138  K 4.2 3.7  CL 104 103  CO2 24 27  GLUCOSE 117* 99  BUN 17 15  CREATININE 0.76 0.75  CALCIUM 8.8* 8.7*   PT/INR Recent Labs    10/24/19 0555  LABPROT 12.1  INR 0.9    Studies/Results: DG Chest 1 View  Result Date: 10/24/2019 CLINICAL DATA:  NG tube placement. EXAM: CHEST  1 VIEW COMPARISON:  None. FINDINGS: The tip of the enteric tube is at the gastroesophageal junction, side-port in the mid esophagus. Recommend advancement of at least 9 cm for optimal placement. Low lung volumes with minor bibasilar atelectasis. Heart is normal in size. IMPRESSION: Tip of the enteric tube at the gastroesophageal junction, side-port in the mid esophagus. Recommend advancement of at least 9 cm for optimal placement. Electronically Signed   By: Keith Rake M.D.   On: 10/24/2019 02:03   CT ABDOMEN PELVIS W CONTRAST  Result Date: 10/23/2019 CLINICAL DATA:  Abdominal pain with nausea and vomiting. Bowel  obstruction suspected. History of small-bowel obstruction. EXAM: CT ABDOMEN AND PELVIS WITH CONTRAST TECHNIQUE: Multidetector CT imaging of the abdomen and pelvis was performed using the standard protocol following bolus administration of intravenous contrast. CONTRAST:  174mL OMNIPAQUE IOHEXOL 300 MG/ML  SOLN COMPARISON:  CT 09/03/2015 FINDINGS: Lower chest: Linear atelectasis in the lingula and to a lesser extent lower lobes. No pleural effusion. Hepatobiliary: No focal liver abnormality is seen. No gallstones, gallbladder wall thickening, or biliary dilatation. Pancreas: No ductal dilatation or inflammation. Spleen: Normal in size without focal abnormality. Adrenals/Urinary Tract: No adrenal nodule. No hydronephrosis. No perinephric edema. Mild cortical scarring in the right kidney. There is symmetric excretion on delayed phase imaging. Irregularly-shaped urinary bladder, patient with history of ileal neobladder. This is not significantly changed in appearance from prior exam. Stomach/Bowel: Stomach mildly distended. There are dilated fluid-filled small bowel loops in the lower abdomen and pelvis. Fecalization of small bowel contents with transition point adjacent to enteric sutures in the lower abdomen, series 2, image 60. More distal small bowel is decompressed. There is no pneumatosis or mesenteric edema. Moderate stool burden in the colon. Appendectomy. Occasional sigmoid colonic diverticula without diverticulitis. Vascular/Lymphatic: Normal caliber abdominal aorta. The portal vein is patent. No adenopathy. Reproductive: Status post hysterectomy. No adnexal masses. Other: Trace free fluid in the pelvis. No free air or organized fluid collection. Tiny fat containing umbilical hernia. Musculoskeletal: There are no acute or suspicious osseous abnormalities. IMPRESSION:  1. Low-grade small-bowel obstruction with transition point adjacent to enteric sutures in the lower abdomen, likely due to adhesions. 2. Minimal  sigmoid colonic diverticulosis without diverticulitis. Electronically Signed   By: Keith Rake M.D.   On: 10/23/2019 22:44   DG Chest Port 1V same Day  Result Date: 10/24/2019 CLINICAL DATA:  NG tube readjustment. EXAM: PORTABLE CHEST 1 VIEW COMPARISON:  Earlier this day. FINDINGS: The enteric tube has been advanced. The tip of the enteric tube is below the diaphragm, the side port is just beyond the gastroesophageal junction. Dilated small bowel in the upper abdomen. Mild bibasilar atelectasis. IMPRESSION: 1. Enteric tube tip below the diaphragm, side port just beyond the gastroesophageal junction. 2. Dilated small bowel in the upper abdomen. Electronically Signed   By: Keith Rake M.D.   On: 10/24/2019 02:45    Anti-infectives: Anti-infectives (From admission, onward)   Start     Dose/Rate Route Frequency Ordered Stop   10/24/19 1000  cefTRIAXone (ROCEPHIN) 1 g in sodium chloride 0.9 % 100 mL IVPB        1 g 200 mL/hr over 30 Minutes Intravenous Every 24 hours 10/24/19 0654 10/25/19 0910   10/23/19 2215  cefTRIAXone (ROCEPHIN) 1 g in sodium chloride 0.9 % 100 mL IVPB        1 g 200 mL/hr over 30 Minutes Intravenous  Once 10/23/19 2207 10/23/19 2341      Assessment/Plan: Impression: Partial small bowel obstruction most likely secondary to adhesive disease. Plan: Continue NG tube decompression for now.  Will try to monitor output.  May get small bowel follow-through protocol tomorrow should she not improve.  LOS: 1 day    Aviva Signs 10/25/2019

## 2019-10-25 NOTE — Progress Notes (Signed)
PROGRESS NOTE    Carol VENNEMAN  UJW:[id number] DOB: June 29, 1965 DOA: 10/23/2019 PCP: Selinda Flavin, MD    Brief Narrative:  HPI: Carol Thompson is a 54 y.o. female with medical history significant for GERD, interstitial cystitis s/p bladder resection and neobladder placement, history of hysterectomy and small bowel obstruction who presents to the emergency department due to generalized abdominal pain which started yesterday in the morning.  Abdominal pain got worse after lunch and she endorsed nonbloody vomiting x2 with several episodes of dry heaves.  Abdominal pain was rated as 10/10 on pain scale with no aggravating/aggravating factor.  She decided to go to the ED for further evaluation and management.  Patient also complained of lower abdominal discomfort that was consistent with her prior cystitis.  ED Course: In the emergency department, BP was 151/103 on arrival, but this eventually improved, other vital signs were within normal range.  Work-up in the ED showed mild leukocytosis, hyperglycemia, urinalysis was positive for UTI.  SARS coronavirus 2 was negative.  CT abdomen and pelvis with contrast showed low-grade small bowel obstruction with transition point adjacent to enteric sutures in the lower abdomen, likely due to adhesions.  Neurosurgery (Dr. Lovell Sheehan) was consulted by ED team and will see patient in the morning.  NG tube was placed.  She was started on IV ceftriaxone, IV morphine was given due to abdominal pain and IV Zofran was given due to vomiting.  Hospitalist was asked to admit patient for further evaluation and management.   Assessment & Plan:   Principal Problem:   Small bowel obstruction (HCC) Active Problems:   Essential hypertension, benign   Acute lower UTI   GERD (gastroesophageal reflux disease)   1. Small bowel obstruction.  Continue NG tube decompression.  Appreciate general surgery input.  Continue supportive measures.  She is not had return of bowel function  as of yet. 2. E. coli urinary tract infection.  Present on admission.  She received 3 days of Rocephin.  No fever or leukocytosis at this time. 3. Hypertension.  Blood pressure currently stable.  Continue to monitor 4. GERD.  Continue PPI    DVT prophylaxis: SCDs Start: 10/24/19 0118  Code Status: Full code Family Communication: Discussed with patient Disposition Plan: Status is: Inpatient  Remains inpatient appropriate because:IV treatments appropriate due to intensity of illness or inability to take PO   Dispo: The patient is from: Home              Anticipated d/c is to: Home              Anticipated d/c date is: 2 days              Patient currently is not medically stable to d/c.         Consultants:   General surgery  Procedures:     Antimicrobials:   Ceftriaxone 8/16> 8/18   Subjective: Complains of twitching in her feet bilaterally for which she normally takes baclofen.  She is passing gas.  No bowel movements as of yet.  Continues to feel nauseous.  Objective: Vitals:   10/24/19 2114 10/25/19 0540 10/25/19 0930 10/25/19 1148  BP: (!) 146/84 (!) 142/84  (!) 144/84  Pulse: 87 89  86  Resp:  20  18  Temp: 99.6 F (37.6 C) 99.1 F (37.3 C)  98.3 F (36.8 C)  TempSrc: Oral Oral  Oral  SpO2: 99% 95% 96% 97%  Weight:      Height:  Intake/Output Summary (Last 24 hours) at 10/25/2019 1501 Last data filed at 10/25/2019 0546 Gross per 24 hour  Intake 1144.56 ml  Output 1000 ml  Net 144.56 ml   Filed Weights   10/23/19 1927 10/24/19 1659  Weight: 71.2 kg 70 kg    Examination:  General exam: Appears calm and comfortable, NG tube in place  Respiratory system: Clear to auscultation. Respiratory effort normal. Cardiovascular system: S1 & S2 heard, RRR. No JVD, murmurs, rubs, gallops or clicks. No pedal edema. Gastrointestinal system: Abdomen is nondistended, soft and nontender. No organomegaly or masses felt. Normal bowel sounds heard. Central  nervous system: Alert and oriented. No focal neurological deficits. Extremities: Symmetric 5 x 5 power. Skin: No rashes, lesions or ulcers Psychiatry: Judgement and insight appear normal. Mood & affect appropriate.     Data Reviewed: I have personally reviewed following labs and imaging studies  CBC: Recent Labs  Lab 10/23/19 1950 10/24/19 0555 10/25/19 0721  WBC 10.8* 11.0* 9.4  HGB 13.6 12.6 12.2  HCT 44.3 40.6 39.4  MCV 85.9 86.0 86.2  PLT 298 276 246   Basic Metabolic Panel: Recent Labs  Lab 10/23/19 1950 10/24/19 0555 10/25/19 0721  NA 139 139 138  K 4.2 4.2 3.7  CL 105 104 103  CO2 24 24 27   GLUCOSE 127* 117* 99  BUN 22* 17 15  CREATININE 0.81 0.76 0.75  CALCIUM 9.5 8.8* 8.7*  MG  --  1.9 2.0  PHOS  --  3.8  --    GFR: Estimated Creatinine Clearance: 72 mL/min (by C-G formula based on SCr of 0.75 mg/dL). Liver Function Tests: Recent Labs  Lab 10/23/19 1950 10/24/19 0555 10/25/19 0721  AST 25 19 18   ALT 24 19 20   ALKPHOS 61 52 55  BILITOT 0.4 0.4 0.4  PROT 8.7* 7.4 7.2  ALBUMIN 4.3 3.6 3.6   Recent Labs  Lab 10/23/19 1950  LIPASE 22   No results for input(s): AMMONIA in the last 168 hours. Coagulation Profile: Recent Labs  Lab 10/24/19 0555  INR 0.9   Cardiac Enzymes: No results for input(s): CKTOTAL, CKMB, CKMBINDEX, TROPONINI in the last 168 hours. BNP (last 3 results) No results for input(s): PROBNP in the last 8760 hours. HbA1C: No results for input(s): HGBA1C in the last 72 hours. CBG: No results for input(s): GLUCAP in the last 168 hours. Lipid Profile: No results for input(s): CHOL, HDL, LDLCALC, TRIG, CHOLHDL, LDLDIRECT in the last 72 hours. Thyroid Function Tests: No results for input(s): TSH, T4TOTAL, FREET4, T3FREE, THYROIDAB in the last 72 hours. Anemia Panel: No results for input(s): VITAMINB12, FOLATE, FERRITIN, TIBC, IRON, RETICCTPCT in the last 72 hours. Sepsis Labs: No results for input(s): PROCALCITON, LATICACIDVEN  in the last 168 hours.  Recent Results (from the past 240 hour(s))  SARS Coronavirus 2 by RT PCR (hospital order, performed in [facility] hospital lab) Nasopharyngeal Nasopharyngeal Swab     Status: None   Collection Time: 10/23/19 12:10 AM   Specimen: Nasopharyngeal Swab  Result Value Ref Range Status   SARS Coronavirus 2 NEGATIVE NEGATIVE Final    Comment: (NOTE) SARS-CoV-2 target nucleic acids are NOT DETECTED.  The SARS-CoV-2 RNA is generally detectable in upper and lower respiratory specimens during the acute phase of infection. The lowest concentration of SARS-CoV-2 viral copies this assay can detect is 250 copies / mL. A negative result does not preclude SARS-CoV-2 infection and should not be used as the sole basis for treatment or other patient management  decisions.  A negative result may occur with improper specimen collection / handling, submission of specimen other than nasopharyngeal swab, presence of viral mutation(s) within the areas targeted by this assay, and inadequate number of viral copies (<250 copies / mL). A negative result must be combined with clinical observations, patient history, and epidemiological information.  Fact Sheet for Patients:   BoilerBrush.com.cy  Fact Sheet for Healthcare Providers: https://pope.com/  This test is not yet approved or  cleared by the [place] FDA and has been authorized for detection and/or diagnosis of SARS-CoV-2 by FDA under an Emergency Use Authorization (EUA).  This EUA will remain in effect (meaning this test can be used) for the duration of the COVID-19 declaration under Section 564(b)(1) of the Act, 21 U.S.C. section 360bbb-3(b)(1), unless the authorization is terminated or revoked sooner.  Performed at [facility], [address], [place], [place] [address]   Urine culture     Status: Abnormal (Preliminary result)   Collection Time: 10/23/19  9:00 PM    Specimen: Urine, Random  Result Value Ref Range Status   Specimen Description   Final    URINE, RANDOM Performed at [facility], [address], [place], [place] [address]    Special Requests   Final    NONE Performed at [facility], [address] [place], [place] [address]    Culture (A)  Final    >=100,000 COLONIES/mL ESCHERICHIA COLI SUSCEPTIBILITIES TO FOLLOW Performed at [facility], [address], [place], [place] [address]    Report Status PENDING  Incomplete         Radiology Studies: DG Chest 1 View  Result Date: 10/24/2019 CLINICAL DATA:  NG tube placement. EXAM: CHEST  1 VIEW COMPARISON:  None. FINDINGS: The tip of the enteric tube is at the gastroesophageal junction, side-port in the mid esophagus. Recommend advancement of at least 9 cm for optimal placement. Low lung volumes with minor bibasilar atelectasis. Heart is normal in size. IMPRESSION: Tip of the enteric tube at the gastroesophageal junction, side-port in the mid esophagus. Recommend advancement of at least 9 cm for optimal placement. Electronically Signed   By: Narda Rutherford M.D.   On: 10/24/2019 02:03   CT ABDOMEN PELVIS W CONTRAST  Result Date: 10/23/2019 CLINICAL DATA:  Abdominal pain with nausea and vomiting. Bowel obstruction suspected. History of small-bowel obstruction. EXAM: CT ABDOMEN AND PELVIS WITH CONTRAST TECHNIQUE: Multidetector CT imaging of the abdomen and pelvis was performed using the standard protocol following bolus administration of intravenous contrast. CONTRAST:  OMNIPAQUE IOHEXOL 300 MG/ML  SOLN COMPARISON:  CT 09/03/2015 FINDINGS: Lower chest: Linear atelectasis in the lingula and to a lesser extent lower lobes. No pleural effusion. Hepatobiliary: No focal liver abnormality is seen. No gallstones, gallbladder wall thickening, or biliary dilatation. Pancreas: No ductal dilatation or inflammation. Spleen: Normal in size without focal abnormality. Adrenals/Urinary Tract: No  adrenal nodule. No hydronephrosis. No perinephric edema. Mild cortical scarring in the right kidney. There is symmetric excretion on delayed phase imaging. Irregularly-shaped urinary bladder, patient with history of ileal neobladder. This is not significantly changed in appearance from prior exam. Stomach/Bowel: Stomach mildly distended. There are dilated fluid-filled small bowel loops in the lower abdomen and pelvis. Fecalization of small bowel contents with transition point adjacent to enteric sutures in the lower abdomen, series 2, image 60. More distal small bowel is decompressed. There is no pneumatosis or mesenteric edema. Moderate stool burden in the colon. Appendectomy. Occasional sigmoid colonic diverticula without  diverticulitis. Vascular/Lymphatic: Normal caliber abdominal aorta. The portal vein is patent. No adenopathy. Reproductive: Status post hysterectomy. No adnexal masses. Other: Trace free fluid in the pelvis. No free air or organized fluid collection. Tiny fat containing umbilical hernia. Musculoskeletal: There are no acute or suspicious osseous abnormalities. IMPRESSION: 1. Low-grade small-bowel obstruction with transition point adjacent to enteric sutures in the lower abdomen, likely due to adhesions. 2. Minimal sigmoid colonic diverticulosis without diverticulitis. Electronically Signed   By: Narda Rutherford M.D.   On: 10/23/2019 22:44   DG Chest Port 1V same Day  Result Date: 10/24/2019 CLINICAL DATA:  NG tube readjustment. EXAM: PORTABLE CHEST 1 VIEW COMPARISON:  Earlier this day. FINDINGS: The enteric tube has been advanced. The tip of the enteric tube is below the diaphragm, the side port is just beyond the gastroesophageal junction. Dilated small bowel in the upper abdomen. Mild bibasilar atelectasis. IMPRESSION: 1. Enteric tube tip below the diaphragm, side port just beyond the gastroesophageal junction. 2. Dilated small bowel in the upper abdomen. Electronically Signed   By: Narda Rutherford M.D.   On: 10/24/2019 02:45        Scheduled Meds: . metoprolol tartrate  5 mg Intravenous Q6H  . pantoprazole (PROTONIX) IV  40 mg Intravenous Q24H   Continuous Infusions: . lactated ringers 75 mL/hr at 10/25/19 0300     LOS: 1 day    Time spent:    Erick Blinks, MD [facility]   If 7PM-7AM, please contact night-coverage [email or web address]  10/25/2019, 3:01 PM

## 2019-10-26 ENCOUNTER — Inpatient Hospital Stay (HOSPITAL_COMMUNITY): Payer: Medicare PPO

## 2019-10-26 MED ORDER — BUDESONIDE 0.25 MG/2ML IN SUSP
0.2500 mg | Freq: Two times a day (BID) | RESPIRATORY_TRACT | Status: DC
  Administered 2019-10-27: 0.25 mg via RESPIRATORY_TRACT
  Filled 2019-10-26: qty 2

## 2019-10-26 MED ORDER — POLYETHYLENE GLYCOL 3350 17 G PO PACK
17.0000 g | PACK | Freq: Every day | ORAL | Status: DC
  Administered 2019-10-26 – 2019-10-27 (×2): 17 g via ORAL
  Filled 2019-10-26 (×2): qty 1

## 2019-10-26 MED ORDER — BACLOFEN 10 MG PO TABS
20.0000 mg | ORAL_TABLET | Freq: Two times a day (BID) | ORAL | Status: DC
  Administered 2019-10-26 – 2019-10-27 (×2): 20 mg via ORAL
  Filled 2019-10-26 (×2): qty 2

## 2019-10-26 MED ORDER — AMITRIPTYLINE HCL 25 MG PO TABS
25.0000 mg | ORAL_TABLET | Freq: Every day | ORAL | Status: DC
  Administered 2019-10-26: 25 mg via ORAL
  Filled 2019-10-26: qty 1

## 2019-10-26 MED ORDER — DIATRIZOATE MEGLUMINE & SODIUM 66-10 % PO SOLN
ORAL | Status: AC
  Administered 2019-10-26: 90 mL
  Filled 2019-10-26: qty 90

## 2019-10-26 MED ORDER — LORAZEPAM 2 MG/ML IJ SOLN
1.0000 mg | INTRAMUSCULAR | Status: DC | PRN
  Administered 2019-10-26: 1 mg via INTRAVENOUS
  Filled 2019-10-26: qty 1

## 2019-10-26 MED ORDER — FLUTICASONE PROPIONATE 50 MCG/ACT NA SUSP
2.0000 | Freq: Every evening | NASAL | Status: DC
  Administered 2019-10-26: 2 via NASAL
  Filled 2019-10-26: qty 16

## 2019-10-26 MED ORDER — DIATRIZOATE MEGLUMINE & SODIUM 66-10 % PO SOLN
90.0000 mL | Freq: Once | ORAL | Status: DC
  Filled 2019-10-26: qty 90

## 2019-10-26 MED ORDER — DIATRIZOATE MEGLUMINE & SODIUM 66-10 % PO SOLN
90.0000 mL | Freq: Once | ORAL | Status: AC
  Filled 2019-10-26: qty 90

## 2019-10-26 NOTE — Progress Notes (Signed)
PROGRESS NOTE    GURTIE BIALY  WFU:[id number] DOB: [birth year] DOA: 10/23/2019 PCP: Selinda Flavin, MD    Brief Narrative:  HPI: DAIANNA Thompson is a 54 y.o. female with medical history significant for GERD, interstitial cystitis s/p bladder resection and neobladder placement, history of hysterectomy and small bowel obstruction who presents to the emergency department due to generalized abdominal pain which started yesterday in the morning.  Abdominal pain got worse after lunch and she endorsed nonbloody vomiting x2 with several episodes of dry heaves.  Abdominal pain was rated as 10/10 on pain scale with no aggravating/aggravating factor.  She decided to go to the ED for further evaluation and management.  Patient also complained of lower abdominal discomfort that was consistent with her prior cystitis.  ED Course: In the emergency department, BP was 151/103 on arrival, but this eventually improved, other vital signs were within normal range.  Work-up in the ED showed mild leukocytosis, hyperglycemia, urinalysis was positive for UTI.  SARS coronavirus 2 was negative.  CT abdomen and pelvis with contrast showed low-grade small bowel obstruction with transition point adjacent to enteric sutures in the lower abdomen, likely due to adhesions.  Neurosurgery (Dr. Lovell Sheehan) was consulted by ED team and will see patient in the morning.  NG tube was placed.  She was started on IV ceftriaxone, IV morphine was given due to abdominal pain and IV Zofran was given due to vomiting.  Hospitalist was asked to admit patient for further evaluation and management.   Assessment & Plan:   Principal Problem:   Small bowel obstruction (HCC) Active Problems:   Essential hypertension, benign   Acute lower UTI   GERD (gastroesophageal reflux disease)   1. Small bowel obstruction.  NG tube has been discontinued.  She is passing gas and having some bowel movements.  Small bowel follow-through has been ordered.   Tolerating clear liquids.  Advance diet as tolerated. 2. E. coli urinary tract infection.  Present on admission.  She received 3 days of Rocephin.  No fever or leukocytosis at this time. 3. Hypertension.  Blood pressure currently stable.  Continue to monitor 4. GERD.  Continue PPI  5. Fibromyalgia.  Resume home dose of baclofen   DVT prophylaxis: SCDs Start: 10/24/19 0118  Code Status: Full code Family Communication: Discussed with patient Disposition Plan: Status is: Inpatient  Remains inpatient appropriate because:IV treatments appropriate due to intensity of illness or inability to take PO   Dispo: The patient is from: Home              Anticipated d/c is to: Home              Anticipated d/c date is: 1 day              Patient currently is not medically stable to d/c.    Consultants:   General surgery  Procedures:     Antimicrobials:   Ceftriaxone 8/16> 8/18   Subjective: She reports having several bowel movements this morning.  Tolerating clear liquids.  NG tube removed this morning  Objective: Vitals:   10/25/19 1820 10/25/19 2045 10/26/19 0627 10/26/19 1151  BP: (!) 151/84 (!) 143/85 (!) 151/91 121/84  Pulse: 94 94 92 81  Resp:  14 19 18   Temp: 99.3 F (37.4 C) 98.9 F (37.2 C)    TempSrc: Oral Oral    SpO2:  100% 97% 96%  Weight:      Height:  Intake/Output Summary (Last 24 hours) at 10/26/2019 2105 Last data filed at 10/26/2019 0500 Gross per 24 hour  Intake --  Output 400 ml  Net -400 ml   Filed Weights   10/23/19 1927 10/24/19 1659  Weight: 71.2 kg 70 kg    Examination:  General exam: Appears calm and comfortable, NG tube in place  Respiratory system: Clear to auscultation. Respiratory effort normal. Cardiovascular system: S1 & S2 heard, RRR. No JVD, murmurs, rubs, gallops or clicks. No pedal edema. Gastrointestinal system: Abdomen is nondistended, soft and nontender. No organomegaly or masses felt. Normal bowel sounds  heard. Central nervous system: Alert and oriented. No focal neurological deficits. Extremities: Symmetric 5 x 5 power. Skin: No rashes, lesions or ulcers Psychiatry: Judgement and insight appear normal. Mood & affect appropriate.     Data Reviewed: I have personally reviewed following labs and imaging studies  CBC: Recent Labs  Lab 10/23/19 1950 10/24/19 0555 10/25/19 0721  WBC 10.8* 11.0* 9.4  HGB 13.6 12.6 12.2  HCT 44.3 40.6 39.4  MCV 85.9 86.0 86.2  PLT 298 276 246   Basic Metabolic Panel: Recent Labs  Lab 10/23/19 1950 10/24/19 0555 10/25/19 0721  NA 139 139 138  K 4.2 4.2 3.7  CL 105 104 103  CO2 24 24 27   GLUCOSE 127* 117* 99  BUN 22* 17 15  CREATININE 0.81 0.76 0.75  CALCIUM 9.5 8.8* 8.7*  MG  --  1.9 2.0  PHOS  --  3.8  --    GFR: Estimated Creatinine Clearance: 72 mL/min (by C-G formula based on SCr of 0.75 mg/dL). Liver Function Tests: Recent Labs  Lab 10/23/19 1950 10/24/19 0555 10/25/19 0721  AST 25 19 18   ALT 24 19 20   ALKPHOS 61 52 55  BILITOT 0.4 0.4 0.4  PROT 8.7* 7.4 7.2  ALBUMIN 4.3 3.6 3.6   Recent Labs  Lab 10/23/19 1950  LIPASE 22   No results for input(s): AMMONIA in the last 168 hours. Coagulation Profile: Recent Labs  Lab 10/24/19 0555  INR 0.9   Cardiac Enzymes: No results for input(s): CKTOTAL, CKMB, CKMBINDEX, TROPONINI in the last 168 hours. BNP (last 3 results) No results for input(s): PROBNP in the last 8760 hours. HbA1C: No results for input(s): HGBA1C in the last 72 hours. CBG: No results for input(s): GLUCAP in the last 168 hours. Lipid Profile: No results for input(s): CHOL, HDL, LDLCALC, TRIG, CHOLHDL, LDLDIRECT in the last 72 hours. Thyroid Function Tests: No results for input(s): TSH, T4TOTAL, FREET4, T3FREE, THYROIDAB in the last 72 hours. Anemia Panel: No results for input(s): VITAMINB12, FOLATE, FERRITIN, TIBC, IRON, RETICCTPCT in the last 72 hours. Sepsis Labs: No results for input(s):  PROCALCITON, LATICACIDVEN in the last 168 hours.  Recent Results (from the past 240 hour(s))  SARS Coronavirus 2 by RT PCR (hospital order, performed in [facility] hospital lab) Nasopharyngeal Nasopharyngeal Swab     Status: None   Collection Time: 10/23/19 12:10 AM   Specimen: Nasopharyngeal Swab  Result Value Ref Range Status   SARS Coronavirus 2 NEGATIVE NEGATIVE Final    Comment: (NOTE) SARS-CoV-2 target nucleic acids are NOT DETECTED.  The SARS-CoV-2 RNA is generally detectable in upper and lower respiratory specimens during the acute phase of infection. The lowest concentration of SARS-CoV-2 viral copies this assay can detect is 250 copies / mL. A negative result does not preclude SARS-CoV-2 infection and should not be used as the sole basis for treatment or other patient management decisions.  A negative result may occur with improper specimen collection / handling, submission of specimen other than nasopharyngeal swab, presence of viral mutation(s) within the areas targeted by this assay, and inadequate number of viral copies (<250 copies / mL). A negative result must be combined with clinical observations, patient history, and epidemiological information.  Fact Sheet for Patients:   BoilerBrush.com.cy  Fact Sheet for Healthcare Providers: https://pope.com/  This test is not yet approved or  cleared by the [place] FDA and has been authorized for detection and/or diagnosis of SARS-CoV-2 by FDA under an Emergency Use Authorization (EUA).  This EUA will remain in effect (meaning this test can be used) for the duration of the COVID-19 declaration under Section 564(b)(1) of the Act, 21 U.S.C. section 360bbb-3(b)(1), unless the authorization is terminated or revoked sooner.  Performed at [facility], [address] [place], [place] [address]   Urine culture     Status: Abnormal (Preliminary result)   Collection Time:  10/23/19  9:00 PM   Specimen: Urine, Random  Result Value Ref Range Status   Specimen Description   Final    URINE, RANDOM Performed at [facility], [address], [place], [place] [address]    Special Requests   Final    NONE Performed at [facility], [address], [place], [place] [address]    Culture (A)  Final    >=100,000 COLONIES/mL ESCHERICHIA COLI SUSCEPTIBILITIES TO FOLLOW REPEATING Performed at [facility], [address], [place], [place] [address]    Report Status PENDING  Incomplete         Radiology Studies: DG Abd Portable 1V-Small Bowel Obstruction Protocol-initial, 8 hr delay  Result Date: 10/26/2019 CLINICAL DATA:  Small-bowel obstruction, history of cervical cancer EXAM: PORTABLE ABDOMEN - 1 VIEW COMPARISON:  10/23/2019 FINDINGS: Supine frontal view of the abdomen and pelvis was performed 8 hours after oral contrast administration. Oral contrast is seen within the descending colon and sigmoid colon. No evidence of bowel obstruction. No acute bony abnormalities. IMPRESSION: 1. Progression of oral contrast into the colon. No evidence of bowel obstruction. Electronically Signed   By: Sharlet Salina M.D.   On: 10/26/2019 19:48        Scheduled Meds: . amitriptyline  25 mg Oral QHS  . baclofen  20 mg Oral BID  . budesonide (PULMICORT) nebulizer solution  0.25 mg Nebulization BID  . diatrizoate meglumine-sodium  90 mL Oral Once  . fluticasone  2 spray Each Nare QPM  . metoprolol tartrate  5 mg Intravenous Q6H  . pantoprazole (PROTONIX) IV  40 mg Intravenous Q24H  . polyethylene glycol  17 g Oral Daily   Continuous Infusions: . lactated ringers 75 mL/hr at 10/25/19 1600     LOS: 2 days    Time spent:    Erick Blinks, MD [facility]   If 7PM-7AM, please contact night-coverage [email or web address]  10/26/2019, 9:05 PM

## 2019-10-26 NOTE — Progress Notes (Signed)
  Subjective: Patient has been passing gas.  No emesis has been noted.  Her NG tube is very irritating to her.  She is anxious secondary to her fibromyalgia.  She denies any significant abdominal pain.  Objective: Vital signs in last 24 hours: Temp:  [98.3 F (36.8 C)-99.3 F (37.4 C)] 98.9 F (37.2 C) (08/18 2045) Pulse Rate:  [86-94] 92 (08/19 0627) Resp:  [14-19] 19 (08/19 0627) BP: (143-151)/(84-91) 151/91 (08/19 0627) SpO2:  [96 %-100 %] 97 % (08/19 0627)    Intake/Output from previous day: 08/18 0701 - 08/19 0700 In: -  Out: 1350 [Urine:1100; Emesis/NG output:250] Intake/Output this shift: No intake/output data recorded.  General appearance: alert, cooperative and no distress GI: Soft, nontender, nondistended.  Bowel sounds present.  Lab Results:  Recent Labs    10/24/19 0555 10/25/19 0721  WBC 11.0* 9.4  HGB 12.6 12.2  HCT 40.6 39.4  PLT 276 246   BMET Recent Labs    10/24/19 0555 10/25/19 0721  NA 139 138  K 4.2 3.7  CL 104 103  CO2 24 27  GLUCOSE 117* 99  BUN 17 15  CREATININE 0.76 0.75  CALCIUM 8.8* 8.7*   PT/INR Recent Labs    10/24/19 0555  LABPROT 12.1  INR 0.9    Studies/Results: No results found.  Anti-infectives: Anti-infectives (From admission, onward)   Start     Dose/Rate Route Frequency Ordered Stop   10/24/19 1000  cefTRIAXone (ROCEPHIN) 1 g in sodium chloride 0.9 % 100 mL IVPB        1 g 200 mL/hr over 30 Minutes Intravenous Every 24 hours 10/24/19 0654 10/25/19 0910   10/23/19 2215  cefTRIAXone (ROCEPHIN) 1 g in sodium chloride 0.9 % 100 mL IVPB        1 g 200 mL/hr over 30 Minutes Intravenous  Once 10/23/19 2207 10/23/19 2341      Assessment/Plan: Impression: Partial small bowel obstruction most likely secondary to adhesive disease.  Seems to be slowly improving. Plan: Have removed NG tube.  Will get small bowel follow-through today.  May start clear liquid diet.  LOS: 2 days    Aviva Signs 10/26/2019

## 2019-10-27 DIAGNOSIS — N3090 Cystitis, unspecified without hematuria: Secondary | ICD-10-CM

## 2019-10-27 LAB — URINE CULTURE: Culture: >=100000 — AB

## 2019-10-27 LAB — BASIC METABOLIC PANEL
Anion gap: 8 (ref 5–15)
BUN: 16 mg/dL (ref 6–20)
CO2: 27 mmol/L (ref 22–32)
Calcium: 8.5 mg/dL — ABNORMAL LOW (ref 8.9–10.3)
Chloride: 108 mmol/L (ref 98–111)
Creatinine, Ser: 0.83 mg/dL (ref 0.44–1.00)
GFR calc Af Amer: >60 mL/min (ref >60)
GFR calc non Af Amer: >60 mL/min (ref >60)
Glucose, Bld: 94 mg/dL (ref 70–99)
Potassium: 3.6 mmol/L (ref 3.5–5.1)
Sodium: 143 mmol/L (ref 135–145)

## 2019-10-27 MED ORDER — POLYETHYLENE GLYCOL 3350 17 G PO PACK: 17.0000 g | PACK | Freq: Every day | ORAL | 0 refills | Status: DC

## 2019-10-27 NOTE — Progress Notes (Signed)
Discharge instructions reviewed with patient. Patient verbalized understanding of instructions. Patient discharged home with husband in stable condition.

## 2019-10-27 NOTE — Progress Notes (Signed)
  Subjective: Patient states her abdominal pain has resolved.  More concerned about her flareup of fibromyalgia.  Objective: Vital signs in last 24 hours: Temp:  [97.7 F (36.5 C)-99.3 F (37.4 C)] 97.7 F (36.5 C) (08/20 0650) Pulse Rate:  [79-83] 83 (08/20 0650) Resp:  [18-19] 19 (08/20 0650) BP: (121-148)/(79-86) 148/86 (08/20 0650) SpO2:  [96 %-98 %] 96 % (08/20 0755) Last BM Date: 10/26/19  Intake/Output from previous day: No intake/output data recorded. Intake/Output this shift: No intake/output data recorded.  General appearance: alert, cooperative and no distress GI: soft, non-tender; bowel sounds normal; no masses,  no organomegaly  Lab Results:  Recent Labs    10/25/19 0721  WBC 9.4  HGB 12.2  HCT 39.4  PLT 246   BMET Recent Labs    10/25/19 0721 10/27/19 0635  NA 138 143  K 3.7 3.6  CL 103 108  CO2 27 27  GLUCOSE 99 94  BUN 15 16  CREATININE 0.75 0.83  CALCIUM 8.7* 8.5*   PT/INR No results for input(s): LABPROT, INR in the last 72 hours.  Studies/Results: DG Abd Portable 1V-Small Bowel Obstruction Protocol-initial, 8 hr delay  Result Date: 10/26/2019 CLINICAL DATA:  Small-bowel obstruction, history of cervical cancer EXAM: PORTABLE ABDOMEN - 1 VIEW COMPARISON:  10/23/2019 FINDINGS: Supine frontal view of the abdomen and pelvis was performed 8 hours after oral contrast administration. Oral contrast is seen within the descending colon and sigmoid colon. No evidence of bowel obstruction. No acute bony abnormalities. IMPRESSION: 1. Progression of oral contrast into the colon. No evidence of bowel obstruction. Electronically Signed   By: Randa Ngo M.D.   On: 10/26/2019 19:48    Anti-infectives: Anti-infectives (From admission, onward)   Start     Dose/Rate Route Frequency Ordered Stop   10/24/19 1000  cefTRIAXone (ROCEPHIN) 1 g in sodium chloride 0.9 % 100 mL IVPB        1 g 200 mL/hr over 30 Minutes Intravenous Every 24 hours 10/24/19 0654  10/25/19 0910   10/23/19 2215  cefTRIAXone (ROCEPHIN) 1 g in sodium chloride 0.9 % 100 mL IVPB        1 g 200 mL/hr over 30 Minutes Intravenous  Once 10/23/19 2207 10/23/19 2341      Assessment/Plan: Impression: Partial small bowel obstruction, resolved.  Small bowel follow-through showed no evidence of obstruction.  Her diet was advanced to a regular diet.  Okay for discharge from surgery standpoint.  No need for surgical intervention.  LOS: 3 days    Aviva Signs 10/27/2019

## 2019-10-27 NOTE — Care Management Important Message (Signed)
Important Message  Patient Details  Name: Carol Thompson MRN: [id number] Date of Birth: 09-Sep-1965   Medicare Important Message Given:  Yes     Tommy Medal 10/27/2019, 3:46 PM

## 2019-10-27 NOTE — Discharge Summary (Signed)
Physician Discharge Summary  Carol Thompson ZOX:[id number] DOB: 1965-09-14 DOA: 10/23/2019  PCP: Selinda Flavin, MD  Admit date: 10/23/2019 Discharge date: 10/27/2019  Admitted From: Home Disposition: Home  Recommendations for Outpatient Follow-up:  1. Follow up with PCP in 1-2 weeks 2. Please obtain BMP/CBC in one week  Home Health: Equipment/Devices:  Discharge Condition: Stable CODE STATUS: Full code Diet recommendation: Heart healthy  Brief/Interim Summary: Carol W Carteris a 54 y.o.femalewith medical history significant forGERD, interstitial cystitis s/p bladder resection and neobladder placement, history of hysterectomy and small bowel obstruction who presents to the emergency department due to generalized abdominal pain which started yesterday in the morning. Abdominal pain got worse after lunch and she endorsed nonbloody vomiting x2 with several episodes of dry heaves. Abdominal pain was rated as 10/10 on pain scale with no aggravating/aggravating factor. She decided to go to the ED for further evaluation and management. Patient also complained of lower abdominal discomfort that was consistent with her prior cystitis.  ED Course: In the emergency department, BP was 151/103 on arrival, but this eventually improved, other vital signs were within normal range. Work-up in the ED showed mild leukocytosis, hyperglycemia, urinalysis was positive for UTI. SARS coronavirus 2 was negative. CT abdomen and pelvis with contrast showed low-grade small bowel obstruction with transition point adjacent to entericsutures in the lowerabdomen, likely due to adhesions. Neurosurgery (Dr. Lovell Sheehan) was consulted by ED team and will see patient in the morning. NG tube was placed. She was started on IV ceftriaxone, IV morphine was given due to abdominal pain and IV Zofran was given due to vomiting. Hospitalist was asked to admit patient for further evaluation and management.  Discharge  Diagnoses:  Principal Problem:   Small bowel obstruction (HCC) Active Problems:   Essential hypertension, benign   Acute lower UTI   GERD (gastroesophageal reflux disease)  1. Small bowel obstruction.    Followed by general surgery.  She underwent NG tube decompression.  With bowel rest, her overall symptoms have improved.  She is now having multiple bowel movements.  Abdominal pain, nausea and vomiting have resolved.  She is tolerating a solid diet.  Small bowel follow-through did not show any persistent obstruction. 2. E. coli urinary tract infection.  Present on admission.  She received 3 days of Rocephin.  No fever or leukocytosis at this time.  Urine culture did show E. coli that was resistant to Rocephin amongst other antibiotics.  Since she is not symptomatic at this time, we will not treat with further antibiotics. 3. Hypertension.  Blood pressure currently stable.  Continue to monitor 4. GERD.  Continue PPI  5. Fibromyalgia.  Resume home dose of baclofen  Discharge Instructions  Discharge Instructions    Diet - low sodium heart healthy   Complete by: As directed    Increase activity slowly   Complete by: As directed      Allergies as of 10/27/2019      Reactions   Ciprofloxacin Rash   Ferric Carboxymaltose Hives   Hives in same arm as infusion predominantly but a few hives in other parts of the body   Gabapentin Hives      Medication List    STOP taking these medications   dicyclomine 10 MG capsule Commonly known as: Bentyl   diltiazem 30 MG tablet Commonly known as: CARDIZEM   DULoxetine 20 MG capsule Commonly known as: CYMBALTA   famotidine 20 MG tablet Commonly known as: Pepcid   Melatonin 5 MG Chew  Oxycodone HCl 10 MG Tabs     TAKE these medications   amitriptyline 25 MG tablet Commonly known as: ELAVIL Take 25 mg by mouth at bedtime.   baclofen 20 MG tablet Commonly known as: LIORESAL Take 20 mg by mouth 2 (two) times daily.   beclomethasone  40 MCG/ACT inhaler Commonly known as: QVAR Inhale 1 puff into the lungs daily.   estradiol 2 MG tablet Commonly known as: ESTRACE Take 2 mg by mouth every evening.   fluticasone 50 MCG/ACT nasal spray Commonly known as: FLONASE Place 2 sprays into both nostrils every evening.   ibuprofen 200 MG tablet Commonly known as: ADVIL Take 200 mg by mouth every 8 (eight) hours as needed (pain.).   lidocaine 2 % jelly Commonly known as: XYLOCAINE Place 1 application into the urethra 6 (six) times daily.   metoprolol succinate 100 MG 24 hr tablet Commonly known as: TOPROL-XL Take 100 mg by mouth daily.   ondansetron 8 MG tablet Commonly known as: ZOFRAN Take 4 mg by mouth every 8 (eight) hours as needed for nausea or vomiting.   oxyCODONE-acetaminophen 10-325 MG tablet Commonly known as: PERCOCET Take 1 tablet by mouth every 6 (six) hours as needed.   polyethylene glycol 17 g packet Commonly known as: MIRALAX / GLYCOLAX Take 17 g by mouth daily. Start taking on: October 28, 2019   PriLOSEC 20 MG capsule Generic drug: omeprazole Take 1 capsule (20 mg total) by mouth daily before breakfast.   promethazine 25 MG tablet Commonly known as: PHENERGAN Take 25 mg by mouth daily as needed (nausea not resolved by zofran.).   Ventolin HFA 108 (90 Base) MCG/ACT inhaler Generic drug: albuterol Inhale 2 puffs into the lungs every 6 (six) hours as needed for wheezing or shortness of breath.   Xtampza ER 9 MG C12a Generic drug: oxyCODONE ER Take 9 mg by mouth 2 (two) times daily.       Follow-up Information    Selinda Flavin, MD. Schedule an appointment as soon as possible for a visit in 2 week(s).   Specialty: Family Medicine Contact information: [address] [place] [address] [phone]              Allergies  Allergen Reactions  . Ciprofloxacin Rash  . Ferric Carboxymaltose Hives    Hives in same arm as infusion predominantly but a few hives in other parts of the body   . Gabapentin Hives    Consultations:  General surgery   Procedures/Studies: DG Chest 1 View  Result Date: 10/24/2019 CLINICAL DATA:  NG tube placement. EXAM: CHEST  1 VIEW COMPARISON:  None. FINDINGS: The tip of the enteric tube is at the gastroesophageal junction, side-port in the mid esophagus. Recommend advancement of at least 9 cm for optimal placement. Low lung volumes with minor bibasilar atelectasis. Heart is normal in size. IMPRESSION: Tip of the enteric tube at the gastroesophageal junction, side-port in the mid esophagus. Recommend advancement of at least 9 cm for optimal placement. Electronically Signed   By: Narda Rutherford M.D.   On: 10/24/2019 02:03   CT ABDOMEN PELVIS W CONTRAST  Result Date: 10/23/2019 CLINICAL DATA:  Abdominal pain with nausea and vomiting. Bowel obstruction suspected. History of small-bowel obstruction. EXAM: CT ABDOMEN AND PELVIS WITH CONTRAST TECHNIQUE: Multidetector CT imaging of the abdomen and pelvis was performed using the standard protocol following bolus administration of intravenous contrast. CONTRAST:  OMNIPAQUE IOHEXOL 300 MG/ML  SOLN COMPARISON:  CT 09/03/2015 FINDINGS: Lower  chest: Linear atelectasis in the lingula and to a lesser extent lower lobes. No pleural effusion. Hepatobiliary: No focal liver abnormality is seen. No gallstones, gallbladder wall thickening, or biliary dilatation. Pancreas: No ductal dilatation or inflammation. Spleen: Normal in size without focal abnormality. Adrenals/Urinary Tract: No adrenal nodule. No hydronephrosis. No perinephric edema. Mild cortical scarring in the right kidney. There is symmetric excretion on delayed phase imaging. Irregularly-shaped urinary bladder, patient with history of ileal neobladder. This is not significantly changed in appearance from prior exam. Stomach/Bowel: Stomach mildly distended. There are dilated fluid-filled small bowel loops in the lower abdomen and pelvis. Fecalization of small  bowel contents with transition point adjacent to enteric sutures in the lower abdomen, series 2, image 60. More distal small bowel is decompressed. There is no pneumatosis or mesenteric edema. Moderate stool burden in the colon. Appendectomy. Occasional sigmoid colonic diverticula without diverticulitis. Vascular/Lymphatic: Normal caliber abdominal aorta. The portal vein is patent. No adenopathy. Reproductive: Status post hysterectomy. No adnexal masses. Other: Trace free fluid in the pelvis. No free air or organized fluid collection. Tiny fat containing umbilical hernia. Musculoskeletal: There are no acute or suspicious osseous abnormalities. IMPRESSION: 1. Low-grade small-bowel obstruction with transition point adjacent to enteric sutures in the lower abdomen, likely due to adhesions. 2. Minimal sigmoid colonic diverticulosis without diverticulitis. Electronically Signed   By: Narda Rutherford M.D.   On: 10/23/2019 22:44   DG Chest Port 1V same Day  Result Date: 10/24/2019 CLINICAL DATA:  NG tube readjustment. EXAM: PORTABLE CHEST 1 VIEW COMPARISON:  Earlier this day. FINDINGS: The enteric tube has been advanced. The tip of the enteric tube is below the diaphragm, the side port is just beyond the gastroesophageal junction. Dilated small bowel in the upper abdomen. Mild bibasilar atelectasis. IMPRESSION: 1. Enteric tube tip below the diaphragm, side port just beyond the gastroesophageal junction. 2. Dilated small bowel in the upper abdomen. Electronically Signed   By: Narda Rutherford M.D.   On: 10/24/2019 02:45   DG Abd Portable 1V-Small Bowel Obstruction Protocol-initial, 8 hr delay  Result Date: 10/26/2019 CLINICAL DATA:  Small-bowel obstruction, history of cervical cancer EXAM: PORTABLE ABDOMEN - 1 VIEW COMPARISON:  10/23/2019 FINDINGS: Supine frontal view of the abdomen and pelvis was performed 8 hours after oral contrast administration. Oral contrast is seen within the descending colon and sigmoid  colon. No evidence of bowel obstruction. No acute bony abnormalities. IMPRESSION: 1. Progression of oral contrast into the colon. No evidence of bowel obstruction. Electronically Signed   By: Sharlet Salina M.D.   On: 10/26/2019 19:48       Subjective: Overall abdominal pain, nausea, vomiting has improved.  She is having bowel movements.  She is tolerating solid diet.  Discharge Exam: Vitals:   10/26/19 2046 10/26/19 2128 10/27/19 0650 10/27/19 0755  BP:  138/79 (!) 148/86   Pulse:  79 83   Resp:  18 19   Temp:  99.3 F (37.4 C) 97.7 F (36.5 C)   TempSrc:  Oral Oral   SpO2: 96% 98% 97% 96%  Weight:      Height:        General: Pt is alert, awake, not in acute distress Cardiovascular: RRR, S1/S2 +, no rubs, no gallops Respiratory: CTA bilaterally, no wheezing, no rhonchi Abdominal: Soft, NT, ND, bowel sounds + Extremities: no edema, no cyanosis    The results of significant diagnostics from this hospitalization (including imaging, microbiology, ancillary and laboratory) are listed below for reference.  Microbiology: Recent Results (from the past 240 hour(s))  SARS Coronavirus 2 by RT PCR (hospital order, performed in [facility] hospital lab) Nasopharyngeal Nasopharyngeal Swab     Status: None   Collection Time: 10/23/19 12:10 AM   Specimen: Nasopharyngeal Swab  Result Value Ref Range Status   SARS Coronavirus 2 NEGATIVE NEGATIVE Final    Comment: (NOTE) SARS-CoV-2 target nucleic acids are NOT DETECTED.  The SARS-CoV-2 RNA is generally detectable in upper and lower respiratory specimens during the acute phase of infection. The lowest concentration of SARS-CoV-2 viral copies this assay can detect is 250 copies / mL. A negative result does not preclude SARS-CoV-2 infection and should not be used as the sole basis for treatment or other patient management decisions.  A negative result may occur with improper specimen collection / handling, submission of specimen  other than nasopharyngeal swab, presence of viral mutation(s) within the areas targeted by this assay, and inadequate number of viral copies (<250 copies / mL). A negative result must be combined with clinical observations, patient history, and epidemiological information.  Fact Sheet for Patients:   BoilerBrush.com.cy  Fact Sheet for Healthcare Providers: https://pope.com/  This test is not yet approved or  cleared by the [place] FDA and has been authorized for detection and/or diagnosis of SARS-CoV-2 by FDA under an Emergency Use Authorization (EUA).  This EUA will remain in effect (meaning this test can be used) for the duration of the COVID-19 declaration under Section 564(b)(1) of the Act, 21 U.S.C. section 360bbb-3(b)(1), unless the authorization is terminated or revoked sooner.  Performed at [facility], [address], [place], [place] [address]   Urine culture     Status: Abnormal   Collection Time: 10/23/19  9:00 PM   Specimen: Urine, Random  Result Value Ref Range Status   Specimen Description   Final    URINE, RANDOM Performed at [facility], [address], [place], [place] [address]    Special Requests   Final    NONE Performed at [facility], [address] [place], [place] [address]    Culture (A)  Final    >=100,000 COLONIES/mL ESCHERICHIA COLI Confirmed Extended Spectrum Beta-Lactamase Producer (ESBL).  In bloodstream infections from ESBL organisms, carbapenems are preferred over piperacillin/tazobactam. They are shown to have a lower risk of mortality.    Report Status 10/27/2019 FINAL  Final   Organism ID, Bacteria ESCHERICHIA COLI (A)  Final      Susceptibility   Escherichia coli - MIC*    AMPICILLIN >=32 RESISTANT Resistant     CEFAZOLIN >=64 RESISTANT Resistant     CEFTRIAXONE >=64 RESISTANT Resistant     CIPROFLOXACIN >=4 RESISTANT Resistant     GENTAMICIN <=1 SENSITIVE Sensitive     IMIPENEM  <=0.25 SENSITIVE Sensitive     NITROFURANTOIN <=16 SENSITIVE Sensitive     TRIMETH/SULFA >=320 RESISTANT Resistant     AMPICILLIN/SULBACTAM >=32 RESISTANT Resistant     PIP/TAZO 8 SENSITIVE Sensitive     * >=100,000 COLONIES/mL ESCHERICHIA COLI     Labs: BNP (last 3 results) No results for input(s): BNP in the last 8760 hours. Basic Metabolic Panel: Recent Labs  Lab 10/23/19 1950 10/24/19 0555 10/25/19 0721 10/27/19 0635  NA 139 139 138 143  K 4.2 4.2 3.7 3.6  CL 105 104 103 108  CO2 24 24 27 27   GLUCOSE 127* 117* 99 94  BUN 22* 17 15 16   CREATININE 0.81 0.76 0.75 0.83  CALCIUM 9.5 8.8* 8.7*  8.5*  MG  --  1.9 2.0  --   PHOS  --  3.8  --   --    Liver Function Tests: Recent Labs  Lab 10/23/19 1950 10/24/19 0555 10/25/19 0721  AST 25 19 18   ALT 24 19 20   ALKPHOS 61 52 55  BILITOT 0.4 0.4 0.4  PROT 8.7* 7.4 7.2  ALBUMIN 4.3 3.6 3.6   Recent Labs  Lab 10/23/19 1950  LIPASE 22   No results for input(s): AMMONIA in the last 168 hours. CBC: Recent Labs  Lab 10/23/19 1950 10/24/19 0555 10/25/19 0721  WBC 10.8* 11.0* 9.4  HGB 13.6 12.6 12.2  HCT 44.3 40.6 39.4  MCV 85.9 86.0 86.2  PLT 298 276 246   Cardiac Enzymes: No results for input(s): CKTOTAL, CKMB, CKMBINDEX, TROPONINI in the last 168 hours. BNP: Invalid input(s): POCBNP CBG: No results for input(s): GLUCAP in the last 168 hours. D-Dimer No results for input(s): DDIMER in the last 72 hours. Hgb A1c No results for input(s): HGBA1C in the last 72 hours. Lipid Profile No results for input(s): CHOL, HDL, LDLCALC, TRIG, CHOLHDL, LDLDIRECT in the last 72 hours. Thyroid function studies No results for input(s): TSH, T4TOTAL, T3FREE, THYROIDAB in the last 72 hours.  Invalid input(s): FREET3 Anemia work up No results for input(s): VITAMINB12, FOLATE, FERRITIN, TIBC, IRON, RETICCTPCT in the last 72 hours. Urinalysis    Component Value Date/Time   COLORURINE YELLOW 10/23/2019 2100   APPEARANCEUR  CLOUDY (A) 10/23/2019 2100   LABSPEC 1.009 10/23/2019 2100   PHURINE 6.0 10/23/2019 2100   GLUCOSEU NEGATIVE 10/23/2019 2100   HGBUR SMALL (A) 10/23/2019 2100   BILIRUBINUR NEGATIVE 10/23/2019 2100   KETONESUR NEGATIVE 10/23/2019 2100   PROTEINUR NEGATIVE 10/23/2019 2100   UROBILINOGEN 0.2 03/02/2014 1951   NITRITE POSITIVE (A) 10/23/2019 2100   LEUKOCYTESUR MODERATE (A) 10/23/2019 2100   Sepsis Labs Invalid input(s): PROCALCITONIN,  WBC,  LACTICIDVEN Microbiology Recent Results (from the past 240 hour(s))  SARS Coronavirus 2 by RT PCR (hospital order, performed in [facility] hospital lab) Nasopharyngeal Nasopharyngeal Swab     Status: None   Collection Time: 10/23/19 12:10 AM   Specimen: Nasopharyngeal Swab  Result Value Ref Range Status   SARS Coronavirus 2 NEGATIVE NEGATIVE Final    Comment: (NOTE) SARS-CoV-2 target nucleic acids are NOT DETECTED.  The SARS-CoV-2 RNA is generally detectable in upper and lower respiratory specimens during the acute phase of infection. The lowest concentration of SARS-CoV-2 viral copies this assay can detect is 250 copies / mL. A negative result does not preclude SARS-CoV-2 infection and should not be used as the sole basis for treatment or other patient management decisions.  A negative result may occur with improper specimen collection / handling, submission of specimen other than nasopharyngeal swab, presence of viral mutation(s) within the areas targeted by this assay, and inadequate number of viral copies (<250 copies / mL). A negative result must be combined with clinical observations, patient history, and epidemiological information.  Fact Sheet for Patients:   BoilerBrush.com.cy  Fact Sheet for Healthcare Providers: https://pope.com/  This test is not yet approved or  cleared by the [place] FDA and has been authorized for detection and/or diagnosis of SARS-CoV-2 by FDA under an  Emergency Use Authorization (EUA).  This EUA will remain in effect (meaning this test can be used) for the duration of the COVID-19 declaration under Section 564(b)(1) of the Act, 21 U.S.C. section 360bbb-3(b)(1), unless the authorization is terminated or  revoked sooner.  Performed at [facility], [address] [place], [place] [address]   Urine culture     Status: Abnormal   Collection Time: 10/23/19  9:00 PM   Specimen: Urine, Random  Result Value Ref Range Status   Specimen Description   Final    URINE, RANDOM Performed at [facility], [address], [place], [place] [address]    Special Requests   Final    NONE Performed at [facility], [address], [place], [place] [address]    Culture (A)  Final    >=100,000 COLONIES/mL ESCHERICHIA COLI Confirmed Extended Spectrum Beta-Lactamase Producer (ESBL).  In bloodstream infections from ESBL organisms, carbapenems are preferred over piperacillin/tazobactam. They are shown to have a lower risk of mortality.    Report Status 10/27/2019 FINAL  Final   Organism ID, Bacteria ESCHERICHIA COLI (A)  Final      Susceptibility   Escherichia coli - MIC*    AMPICILLIN >=32 RESISTANT Resistant     CEFAZOLIN >=64 RESISTANT Resistant     CEFTRIAXONE >=64 RESISTANT Resistant     CIPROFLOXACIN >=4 RESISTANT Resistant     GENTAMICIN <=1 SENSITIVE Sensitive     IMIPENEM <=0.25 SENSITIVE Sensitive     NITROFURANTOIN <=16 SENSITIVE Sensitive     TRIMETH/SULFA >=320 RESISTANT Resistant     AMPICILLIN/SULBACTAM >=32 RESISTANT Resistant     PIP/TAZO 8 SENSITIVE Sensitive     * >=100,000 COLONIES/mL ESCHERICHIA COLI     Time coordinating discharge:  SIGNED:   Erick Blinks, MD  [facility] 10/27/2019, 8:11 PM   If 7PM-7AM, please contact night-coverage [email or web address]

## 2019-10-30 ENCOUNTER — Other Ambulatory Visit: Payer: Self-pay

## 2019-10-30 NOTE — Patient Outreach (Addendum)
[facility]) Care Management  10/30/2019  Carol Thompson 1965/05/21 [id number]   EMMI- General Discharge RED ON EMMI ALERT Day # 1 Date: 10/29/19 Red Alert Reason:  Scheduled Follow up? No  Outreach attempt: no answer.  HIPAA compliant voice message left.  Incoming call from patient. She states she is slowly getting better.  Advised patient to listen to her body and take it one day at a time. Addressed red alert with patient.  She states she has not had time to call.  Offered to assist with making follow up appointment.  Patient declined needing assistance and declined any further nurse follow up.     Plan: RN CM will close case.    Jone Baseman, RN, MSN [facility] Care Management Care Management Coordinator Direct Line [phone] Toll Free: [phone]  Fax: [phone]

## 2019-10-31 DIAGNOSIS — K56609 Unspecified intestinal obstruction, unspecified as to partial versus complete obstruction: Secondary | ICD-10-CM | POA: Diagnosis not present

## 2019-10-31 DIAGNOSIS — Z1389 Encounter for screening for other disorder: Secondary | ICD-10-CM | POA: Diagnosis not present

## 2019-10-31 DIAGNOSIS — Z1331 Encounter for screening for depression: Secondary | ICD-10-CM | POA: Diagnosis not present

## 2019-10-31 DIAGNOSIS — Z6829 Body mass index (BMI) 29.0-29.9, adult: Secondary | ICD-10-CM | POA: Diagnosis not present

## 2019-11-24 DIAGNOSIS — R339 Retention of urine, unspecified: Secondary | ICD-10-CM | POA: Diagnosis not present

## 2019-11-29 DIAGNOSIS — G90512 Complex regional pain syndrome I of left upper limb: Secondary | ICD-10-CM | POA: Diagnosis not present

## 2019-11-29 DIAGNOSIS — M5136 Other intervertebral disc degeneration, lumbar region: Secondary | ICD-10-CM | POA: Diagnosis not present

## 2019-11-29 DIAGNOSIS — G894 Chronic pain syndrome: Secondary | ICD-10-CM | POA: Diagnosis not present

## 2019-11-29 DIAGNOSIS — M797 Fibromyalgia: Secondary | ICD-10-CM | POA: Diagnosis not present

## 2019-11-29 DIAGNOSIS — M5416 Radiculopathy, lumbar region: Secondary | ICD-10-CM | POA: Diagnosis not present

## 2019-11-29 DIAGNOSIS — M4807 Spinal stenosis, lumbosacral region: Secondary | ICD-10-CM | POA: Diagnosis not present

## 2019-12-04 DIAGNOSIS — E559 Vitamin D deficiency, unspecified: Secondary | ICD-10-CM | POA: Diagnosis not present

## 2019-12-04 DIAGNOSIS — R5382 Chronic fatigue, unspecified: Secondary | ICD-10-CM | POA: Diagnosis not present

## 2019-12-07 DIAGNOSIS — G90512 Complex regional pain syndrome I of left upper limb: Secondary | ICD-10-CM | POA: Diagnosis not present

## 2019-12-07 DIAGNOSIS — Z6829 Body mass index (BMI) 29.0-29.9, adult: Secondary | ICD-10-CM | POA: Diagnosis not present

## 2019-12-07 DIAGNOSIS — M797 Fibromyalgia: Secondary | ICD-10-CM | POA: Diagnosis not present

## 2019-12-07 DIAGNOSIS — Z23 Encounter for immunization: Secondary | ICD-10-CM | POA: Diagnosis not present

## 2019-12-07 DIAGNOSIS — I10 Essential (primary) hypertension: Secondary | ICD-10-CM | POA: Diagnosis not present

## 2019-12-25 DIAGNOSIS — R339 Retention of urine, unspecified: Secondary | ICD-10-CM | POA: Diagnosis not present

## 2019-12-28 DIAGNOSIS — M5136 Other intervertebral disc degeneration, lumbar region: Secondary | ICD-10-CM | POA: Diagnosis not present

## 2020-02-21 DIAGNOSIS — R339 Retention of urine, unspecified: Secondary | ICD-10-CM | POA: Diagnosis not present

## 2020-03-14 DIAGNOSIS — Z01419 Encounter for gynecological examination (general) (routine) without abnormal findings: Secondary | ICD-10-CM | POA: Diagnosis not present

## 2020-03-14 DIAGNOSIS — Z906 Acquired absence of other parts of urinary tract: Secondary | ICD-10-CM | POA: Diagnosis not present

## 2020-03-14 DIAGNOSIS — N9489 Other specified conditions associated with female genital organs and menstrual cycle: Secondary | ICD-10-CM | POA: Diagnosis not present

## 2020-03-14 DIAGNOSIS — Z8541 Personal history of malignant neoplasm of cervix uteri: Secondary | ICD-10-CM | POA: Diagnosis not present

## 2020-03-22 DIAGNOSIS — Z1231 Encounter for screening mammogram for malignant neoplasm of breast: Secondary | ICD-10-CM | POA: Diagnosis not present

## 2020-03-28 DIAGNOSIS — R339 Retention of urine, unspecified: Secondary | ICD-10-CM | POA: Diagnosis not present

## 2020-04-04 DIAGNOSIS — R399 Unspecified symptoms and signs involving the genitourinary system: Secondary | ICD-10-CM | POA: Diagnosis not present

## 2020-04-04 DIAGNOSIS — Z9889 Other specified postprocedural states: Secondary | ICD-10-CM | POA: Diagnosis not present

## 2020-04-04 DIAGNOSIS — N3010 Interstitial cystitis (chronic) without hematuria: Secondary | ICD-10-CM | POA: Diagnosis not present

## 2020-04-25 DIAGNOSIS — R339 Retention of urine, unspecified: Secondary | ICD-10-CM | POA: Diagnosis not present

## 2020-05-01 DIAGNOSIS — M5136 Other intervertebral disc degeneration, lumbar region: Secondary | ICD-10-CM | POA: Diagnosis not present

## 2020-05-01 DIAGNOSIS — G90512 Complex regional pain syndrome I of left upper limb: Secondary | ICD-10-CM | POA: Diagnosis not present

## 2020-05-01 DIAGNOSIS — Z5181 Encounter for therapeutic drug level monitoring: Secondary | ICD-10-CM | POA: Diagnosis not present

## 2020-05-01 DIAGNOSIS — M47816 Spondylosis without myelopathy or radiculopathy, lumbar region: Secondary | ICD-10-CM | POA: Diagnosis not present

## 2020-05-03 ENCOUNTER — Other Ambulatory Visit (HOSPITAL_COMMUNITY): Payer: Self-pay | Admitting: Nurse Practitioner

## 2020-05-03 DIAGNOSIS — M5136 Other intervertebral disc degeneration, lumbar region: Secondary | ICD-10-CM

## 2020-05-17 ENCOUNTER — Ambulatory Visit (HOSPITAL_COMMUNITY)
Admission: RE | Admit: 2020-05-17 | Discharge: 2020-05-17 | Disposition: A | Discharge status: Home / Self Care | Payer: Medicare Other | Source: Ambulatory Visit | Attending: Nurse Practitioner | Admitting: Nurse Practitioner

## 2020-05-17 DIAGNOSIS — M545 Low back pain, unspecified: Secondary | ICD-10-CM | POA: Diagnosis not present

## 2020-05-17 DIAGNOSIS — M5136 Other intervertebral disc degeneration, lumbar region: Secondary | ICD-10-CM

## 2020-05-24 DIAGNOSIS — R339 Retention of urine, unspecified: Secondary | ICD-10-CM | POA: Diagnosis not present

## 2020-05-29 DIAGNOSIS — Z23 Encounter for immunization: Secondary | ICD-10-CM | POA: Diagnosis not present

## 2020-05-29 DIAGNOSIS — R300 Dysuria: Secondary | ICD-10-CM | POA: Diagnosis not present

## 2020-05-29 DIAGNOSIS — G90512 Complex regional pain syndrome I of left upper limb: Secondary | ICD-10-CM | POA: Diagnosis not present

## 2020-05-29 DIAGNOSIS — Z0001 Encounter for general adult medical examination with abnormal findings: Secondary | ICD-10-CM | POA: Diagnosis not present

## 2020-05-29 DIAGNOSIS — M797 Fibromyalgia: Secondary | ICD-10-CM | POA: Diagnosis not present

## 2020-05-29 DIAGNOSIS — I10 Essential (primary) hypertension: Secondary | ICD-10-CM | POA: Diagnosis not present

## 2020-07-05 DIAGNOSIS — R339 Retention of urine, unspecified: Secondary | ICD-10-CM | POA: Diagnosis not present

## 2020-07-25 DIAGNOSIS — G90512 Complex regional pain syndrome I of left upper limb: Secondary | ICD-10-CM | POA: Diagnosis not present

## 2020-07-25 DIAGNOSIS — M5136 Other intervertebral disc degeneration, lumbar region: Secondary | ICD-10-CM | POA: Diagnosis not present

## 2020-07-25 DIAGNOSIS — M47816 Spondylosis without myelopathy or radiculopathy, lumbar region: Secondary | ICD-10-CM | POA: Diagnosis not present

## 2020-07-25 DIAGNOSIS — G894 Chronic pain syndrome: Secondary | ICD-10-CM | POA: Diagnosis not present

## 2020-07-30 ENCOUNTER — Other Ambulatory Visit: Payer: Self-pay

## 2020-07-30 ENCOUNTER — Encounter: Payer: Self-pay | Admitting: Emergency Medicine

## 2020-07-30 ENCOUNTER — Ambulatory Visit
Admission: EM | Admit: 2020-07-30 | Discharge: 2020-07-30 | Disposition: A | Discharge status: Home / Self Care | Payer: Medicare Other

## 2020-07-30 DIAGNOSIS — M25512 Pain in left shoulder: Secondary | ICD-10-CM

## 2020-07-30 MED ORDER — DEXAMETHASONE SODIUM PHOSPHATE 10 MG/ML IJ SOLN
10.0000 mg | Freq: Once | INTRAMUSCULAR | Status: AC
  Administered 2020-07-30: 10 mg via INTRAMUSCULAR
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
## 2020-07-30 NOTE — Discharge Instructions (Signed)
You have received a steroid injection in the office today  May take home pain medications  May take home muscle relaxers  May use ice or heat to the area as needed for comfort  Follow-up with orthopedics if symptoms or not improving over the next week

## 2020-08-13 DIAGNOSIS — R339 Retention of urine, unspecified: Secondary | ICD-10-CM | POA: Diagnosis not present

## 2020-09-12 DIAGNOSIS — R339 Retention of urine, unspecified: Secondary | ICD-10-CM | POA: Diagnosis not present

## 2020-09-19 DIAGNOSIS — G43009 Migraine without aura, not intractable, without status migrainosus: Secondary | ICD-10-CM | POA: Diagnosis not present

## 2020-09-19 DIAGNOSIS — I10 Essential (primary) hypertension: Secondary | ICD-10-CM | POA: Diagnosis not present

## 2020-10-14 DIAGNOSIS — R339 Retention of urine, unspecified: Secondary | ICD-10-CM | POA: Diagnosis not present

## 2020-10-15 DIAGNOSIS — Z79891 Long term (current) use of opiate analgesic: Secondary | ICD-10-CM | POA: Diagnosis not present

## 2020-10-15 DIAGNOSIS — G90512 Complex regional pain syndrome I of left upper limb: Secondary | ICD-10-CM | POA: Diagnosis not present

## 2020-10-15 DIAGNOSIS — M542 Cervicalgia: Secondary | ICD-10-CM | POA: Diagnosis not present

## 2020-10-15 DIAGNOSIS — M7918 Myalgia, other site: Secondary | ICD-10-CM | POA: Diagnosis not present

## 2020-10-16 DIAGNOSIS — E559 Vitamin D deficiency, unspecified: Secondary | ICD-10-CM | POA: Diagnosis not present

## 2020-10-16 DIAGNOSIS — G43909 Migraine, unspecified, not intractable, without status migrainosus: Secondary | ICD-10-CM | POA: Diagnosis not present

## 2020-10-16 DIAGNOSIS — I10 Essential (primary) hypertension: Secondary | ICD-10-CM | POA: Diagnosis not present

## 2020-10-16 DIAGNOSIS — E038 Other specified hypothyroidism: Secondary | ICD-10-CM | POA: Diagnosis not present

## 2020-11-06 DIAGNOSIS — E038 Other specified hypothyroidism: Secondary | ICD-10-CM | POA: Diagnosis not present

## 2020-11-06 DIAGNOSIS — I10 Essential (primary) hypertension: Secondary | ICD-10-CM | POA: Diagnosis not present

## 2020-11-06 DIAGNOSIS — G43009 Migraine without aura, not intractable, without status migrainosus: Secondary | ICD-10-CM | POA: Diagnosis not present

## 2020-11-06 DIAGNOSIS — M797 Fibromyalgia: Secondary | ICD-10-CM | POA: Diagnosis not present

## 2020-11-12 ENCOUNTER — Encounter (HOSPITAL_COMMUNITY): Payer: Self-pay | Admitting: Physical Therapy

## 2020-11-12 ENCOUNTER — Other Ambulatory Visit: Payer: Self-pay

## 2020-11-12 ENCOUNTER — Ambulatory Visit (HOSPITAL_COMMUNITY): Payer: Medicare Other | Attending: Nurse Practitioner | Admitting: Physical Therapy

## 2020-11-12 DIAGNOSIS — M542 Cervicalgia: Secondary | ICD-10-CM | POA: Diagnosis not present

## 2020-11-12 DIAGNOSIS — R29898 Other symptoms and signs involving the musculoskeletal system: Secondary | ICD-10-CM | POA: Insufficient documentation

## 2020-11-12 NOTE — Patient Instructions (Signed)
Access Code: [email or web address] URL: [email or web address] Date: 11/12/2020 Prepared by: Mitzi Hansen Deshaun Weisinger  Exercises Supine Scapular Retraction - 3 x daily - 7 x weekly - 2 sets - 10 reps
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
## 2020-11-14 ENCOUNTER — Ambulatory Visit (HOSPITAL_COMMUNITY): Payer: Medicare Other

## 2020-11-14 DIAGNOSIS — R339 Retention of urine, unspecified: Secondary | ICD-10-CM | POA: Diagnosis not present

## 2020-11-19 ENCOUNTER — Other Ambulatory Visit: Payer: Self-pay

## 2020-11-19 ENCOUNTER — Ambulatory Visit (HOSPITAL_COMMUNITY): Payer: Medicare Other

## 2020-11-19 DIAGNOSIS — M542 Cervicalgia: Secondary | ICD-10-CM | POA: Diagnosis not present

## 2020-11-19 DIAGNOSIS — R29898 Other symptoms and signs involving the musculoskeletal system: Secondary | ICD-10-CM | POA: Diagnosis not present

## 2020-11-19 NOTE — Therapy (Signed)
Visit Plan possibly manual for pain/mobility, cervical ROM, postural strengthening    PT Home Exercise Plan scap retractions    Consulted and Agree with Plan of Care Patient             Patient will benefit from skilled therapeutic intervention in order to improve the following deficits and impairments:  Decreased range of motion, Decreased endurance, Increased muscle spasms, Impaired UE functional use, Pain, Decreased activity tolerance, Impaired flexibility, Improper body mechanics, Postural dysfunction, Decreased strength, Decreased mobility  Visit Diagnosis: Cervicalgia  Other symptoms and signs involving the musculoskeletal system     Problem List Patient Active Problem List   Diagnosis Date Noted   Small bowel obstruction ([place]) 10/24/2019   GERD (gastroesophageal reflux disease) 10/24/2019   Upper abdominal pain 11/24/2018   Abdominal bloating 11/24/2018   Hematochezia 11/24/2018   Partial small bowel obstruction ([place]) 09/03/2015   Acute lower UTI 09/03/2015   Chronic interstitial cystitis 09/03/2015   Essential hypertension 09/03/2015   Fibromyalgia 09/03/2015   Atypical chest pain 06/02/2011   Bradycardia 05/27/2010   Essential hypertension, benign 05/27/2010   Palpitations 05/27/2010   11:15 AM, 11/19/20 M. Sherlyn Lees, PT, DPT Physical Therapist- [place] Office Number: [phone]   Carol Thompson [address] [place], [place], [address] Phone: [phone]   Fax:  [phone]  Name: Carol Thompson MRN: [id number] Date of Birth: 05-04-1965  Visit Plan possibly manual for pain/mobility, cervical ROM, postural strengthening    PT Home Exercise Plan scap retractions    Consulted and Agree with Plan of Care Patient             Patient will benefit from skilled therapeutic intervention in order to improve the following deficits and impairments:  Decreased range of motion, Decreased endurance, Increased muscle spasms, Impaired UE functional use, Pain, Decreased activity tolerance, Impaired flexibility, Improper body mechanics, Postural dysfunction, Decreased strength, Decreased mobility  Visit Diagnosis: Cervicalgia  Other symptoms and signs involving the musculoskeletal system     Problem List Patient Active Problem List   Diagnosis Date Noted   Small bowel obstruction ([place]) 10/24/2019   GERD (gastroesophageal reflux disease) 10/24/2019   Upper abdominal pain 11/24/2018   Abdominal bloating 11/24/2018   Hematochezia 11/24/2018   Partial small bowel obstruction ([place]) 09/03/2015   Acute lower UTI 09/03/2015   Chronic interstitial cystitis 09/03/2015   Essential hypertension 09/03/2015   Fibromyalgia 09/03/2015   Atypical chest pain 06/02/2011   Bradycardia 05/27/2010   Essential hypertension, benign 05/27/2010   Palpitations 05/27/2010   11:15 AM, 11/19/20 M. Sherlyn Lees, PT, DPT Physical Therapist- [place] Office Number: [phone]   Carol Thompson [address] [place], [place], [address] Phone: [phone]   Fax:  [phone]  Name: Carol Thompson MRN: [id number] Date of Birth: 05-04-1965  [place], [place], [address] Phone: [phone]   Fax:  [phone]  Physical Therapy Treatment  Patient Details  Name: Carol Thompson MRN: [id number] Date of Birth: [birth year] Referring Provider (PT): Elfredia Nevins NP   Encounter Date: 11/19/2020   PT End of Session - 11/19/20 1045     Visit Number 2    Number of Visits 12    Date for PT Re-Evaluation 12/24/20    Authorization Type BCBS Medicare (no auth req, no visit limit)    Progress Note Due on Visit 10    PT Start Time 1030    PT Stop Time 1115    PT Time Calculation (min) 45 min    Activity Tolerance Patient limited by pain    Behavior During Therapy [facility] for tasks assessed/performed             Past Medical History:  Diagnosis Date   Acid reflux    Asthma    Cervical cancer ([place])    Chronic fatigue    Chronic pain    Cystitis, interstitial    Depression    Fibromyalgia    Hypertension    IBS (irritable bowel syndrome)    Internal hemorrhoids    Colonoscopy 11/11 - Dr. Laural Golden   Interstitial cystitis    Migraine    Pelvic floor dysfunction    Sleep deprivation    Vulvodynia     Past Surgical History:  Procedure Laterality Date   ABDOMINAL HYSTERECTOMY     APPENDECTOMY     BLADDER SURGERY     02/2014 ileal neobladder secondary to severe interstitial cystitis    COLONOSCOPY WITH PROPOFOL N/A 12/09/2018   Procedure: COLONOSCOPY WITH PROPOFOL;  Surgeon: Rogene Houston, MD;  [facility];  Service: Endoscopy;  Laterality: N/A;   ESOPHAGOGASTRODUODENOSCOPY (EGD) WITH PROPOFOL N/A 12/09/2018   Procedure: ESOPHAGOGASTRODUODENOSCOPY (EGD) WITH PROPOFOL;  Surgeon: Rogene Houston, MD;  [facility];  Service: Endoscopy;  Laterality: N/A;  140pm    There were no vitals filed for this visit.   Subjective Assessment - 11/19/20 1033     Subjective Endorses increased right side neck pain and notes pain in her right hand as  well. Increased pain when rotating head/neck to the right with increase in deep, dull discomfort    Limitations Lifting;House hold activities    Patient Stated Goals decrease pain and play with grandchildren    Currently in Pain? Yes    Pain Score 7     Pain Location Neck    Pain Orientation Right    Pain Descriptors / Indicators Dull;Discomfort;Throbbing;Aching    Pain Type Acute pain    Pain Onset More than a month ago                [facility] PT Assessment - 11/19/20 0001       Assessment   Medical Diagnosis Myofascial Pain/ Cervicalgia                           [facility] Adult PT Treatment/Exercise - 11/19/20 0001       Neck Exercises: Seated   Other Seated Exercise cervical extension SNAG with towel x 2 min. Rotation SNAG x 1 min left/right      Neck Exercises: Supine   Neck Retraction 20 reps    Neck Retraction Limitations gentle pressure, performed with hot pack under neck

## 2020-11-21 ENCOUNTER — Other Ambulatory Visit: Payer: Self-pay

## 2020-11-21 ENCOUNTER — Ambulatory Visit (HOSPITAL_COMMUNITY): Payer: Medicare Other | Admitting: Physical Therapy

## 2020-11-21 DIAGNOSIS — R29898 Other symptoms and signs involving the musculoskeletal system: Secondary | ICD-10-CM | POA: Diagnosis not present

## 2020-11-21 DIAGNOSIS — M47812 Spondylosis without myelopathy or radiculopathy, cervical region: Secondary | ICD-10-CM | POA: Diagnosis not present

## 2020-11-21 DIAGNOSIS — M542 Cervicalgia: Secondary | ICD-10-CM

## 2020-11-21 DIAGNOSIS — M546 Pain in thoracic spine: Secondary | ICD-10-CM | POA: Diagnosis not present

## 2020-11-21 DIAGNOSIS — G894 Chronic pain syndrome: Secondary | ICD-10-CM | POA: Diagnosis not present

## 2020-11-21 NOTE — Therapy (Signed)
manual had lasting improvements for pain/mobility, cervical ROM, postural strengthening    PT Home Exercise Plan scap retractions    Consulted and Agree with Plan of Care Patient             Patient will benefit from skilled therapeutic intervention in order to improve the following deficits and impairments:  Decreased range of motion, Decreased endurance, Increased muscle spasms, Impaired UE functional use, Pain, Decreased activity tolerance, Impaired flexibility, Improper body mechanics, Postural dysfunction, Decreased strength, Decreased mobility  Visit Diagnosis: Cervicalgia  Other symptoms and signs involving the musculoskeletal system     Problem List Patient Active Problem List   Diagnosis Date Noted   Small bowel obstruction ([place]) 10/24/2019   GERD (gastroesophageal reflux disease) 10/24/2019   Upper abdominal pain 11/24/2018   Abdominal bloating 11/24/2018   Hematochezia 11/24/2018   Partial small bowel obstruction ([place]) 09/03/2015   Acute lower UTI 09/03/2015   Chronic interstitial cystitis 09/03/2015   Essential hypertension 09/03/2015   Fibromyalgia 09/03/2015   Atypical chest pain 06/02/2011   Bradycardia 05/27/2010   Essential hypertension, benign 05/27/2010   Palpitations 05/27/2010   Teena Irani, PTA/CLT [phone]  Teena Irani, PTA 11/21/2020, 1:55 PM  [facility] [address] [place], [place], [address] Phone: [phone]   Fax:  [phone]  Name: Carol Thompson MRN: [id number] Date of Birth: Nov 14, 1965  [place], [place], [address] Phone: [phone]   Fax:  [phone]  Physical Therapy Treatment  Patient Details  Name: Carol Thompson MRN: [id number] Date of Birth: [birth year] Referring Provider (PT): Elfredia Nevins NP   Encounter Date: 11/21/2020   PT End of Session - 11/21/20 1354     Visit Number 3    Number of Visits 12    Date for PT Re-Evaluation 12/24/20    Authorization Type BCBS Medicare (no auth req, no visit limit)    Progress Note Due on Visit 10    PT Start Time 1048    PT Stop Time 1133    PT Time Calculation (min) 45 min    Activity Tolerance Patient limited by pain    Behavior During Therapy [facility] for tasks assessed/performed             Past Medical History:  Diagnosis Date   Acid reflux    Asthma    Cervical cancer ([place])    Chronic fatigue    Chronic pain    Cystitis, interstitial    Depression    Fibromyalgia    Hypertension    IBS (irritable bowel syndrome)    Internal hemorrhoids    Colonoscopy 11/11 - Dr. Laural Golden   Interstitial cystitis    Migraine    Pelvic floor dysfunction    Sleep deprivation    Vulvodynia     Past Surgical History:  Procedure Laterality Date   ABDOMINAL HYSTERECTOMY     APPENDECTOMY     BLADDER SURGERY     02/2014 ileal neobladder secondary to severe interstitial cystitis    COLONOSCOPY WITH PROPOFOL N/A 12/09/2018   Procedure: COLONOSCOPY WITH PROPOFOL;  Surgeon: Rogene Houston, MD;  [facility];  Service: Endoscopy;  Laterality: N/A;   ESOPHAGOGASTRODUODENOSCOPY (EGD) WITH PROPOFOL N/A 12/09/2018   Procedure: ESOPHAGOGASTRODUODENOSCOPY (EGD) WITH PROPOFOL;  Surgeon: Rogene Houston, MD;  [facility];  Service: Endoscopy;  Laterality: N/A;  140pm    There were no vitals filed for this visit.   Subjective Assessment - 11/21/20 1056     Subjective Pt reports 5/10 pain both sides but more at the right.  States she's also  having nonstop pain in her thoracic area.  Returning ot MD today to evaluate her thoracic area.  States the exercises are hard but she is doing them and letting pain be her guide.    Currently in Pain? Yes    Pain Score 6     Pain Location Neck    Pain Orientation Right;Left    Pain Descriptors / Indicators Aching;Dull    Pain Type Acute pain                               [facility] Adult PT Treatment/Exercise - 11/21/20 0001       Neck Exercises: Seated   Neck Retraction 5 reps    Lateral Flexion 10 reps    W Back 10 reps    Other Seated Exercise cervical extension 5X in painfree ROM      Manual Therapy   Manual Therapy Other (comment);Soft tissue mobilization    Manual therapy comments completed at [facility] seperately from all othe rskilled interventions    Soft tissue mobilization to bilatera UT, scaps and scalenes in sitting    Other Manual Therapy attemtped Occipital release and gentle traction in supine;  manual had lasting improvements for pain/mobility, cervical ROM, postural strengthening    PT Home Exercise Plan scap retractions    Consulted and Agree with Plan of Care Patient             Patient will benefit from skilled therapeutic intervention in order to improve the following deficits and impairments:  Decreased range of motion, Decreased endurance, Increased muscle spasms, Impaired UE functional use, Pain, Decreased activity tolerance, Impaired flexibility, Improper body mechanics, Postural dysfunction, Decreased strength, Decreased mobility  Visit Diagnosis: Cervicalgia  Other symptoms and signs involving the musculoskeletal system     Problem List Patient Active Problem List   Diagnosis Date Noted   Small bowel obstruction ([place]) 10/24/2019   GERD (gastroesophageal reflux disease) 10/24/2019   Upper abdominal pain 11/24/2018   Abdominal bloating 11/24/2018   Hematochezia 11/24/2018   Partial small bowel obstruction ([place]) 09/03/2015   Acute lower UTI 09/03/2015   Chronic interstitial cystitis 09/03/2015   Essential hypertension 09/03/2015   Fibromyalgia 09/03/2015   Atypical chest pain 06/02/2011   Bradycardia 05/27/2010   Essential hypertension, benign 05/27/2010   Palpitations 05/27/2010   Teena Irani, PTA/CLT [phone]  Teena Irani, PTA 11/21/2020, 1:55 PM  [facility] [address] [place], [place], [address] Phone: [phone]   Fax:  [phone]  Name: Carol Thompson MRN: [id number] Date of Birth: Nov 14, 1965

## 2020-11-26 ENCOUNTER — Ambulatory Visit (HOSPITAL_COMMUNITY): Payer: Medicare Other

## 2020-11-26 ENCOUNTER — Other Ambulatory Visit: Payer: Self-pay

## 2020-11-26 DIAGNOSIS — R29898 Other symptoms and signs involving the musculoskeletal system: Secondary | ICD-10-CM

## 2020-11-26 DIAGNOSIS — M542 Cervicalgia: Secondary | ICD-10-CM

## 2020-11-26 NOTE — Therapy (Signed)
[place], [place], [address] Phone: [phone]   Fax:  [phone]  Physical Therapy Treatment  Patient Details  Name: Carol Thompson MRN: [id number] Date of Birth: Oct 18, 1965 Referring Provider (PT): Elfredia Nevins NP   Encounter Date: 11/26/2020   PT End of Session - 11/26/20 1039     Visit Number 4    Number of Visits 12    Date for PT Re-Evaluation 12/24/20    Authorization Type BCBS Medicare (no auth req, no visit limit)    Progress Note Due on Visit 10    PT Start Time 1035    PT Stop Time 1120    PT Time Calculation (min) 45 min    Activity Tolerance Patient limited by pain    Behavior During Therapy [facility] for tasks assessed/performed             Past Medical History:  Diagnosis Date   Acid reflux    Asthma    Cervical cancer ([place])    Chronic fatigue    Chronic pain    Cystitis, interstitial    Depression    Fibromyalgia    Hypertension    IBS (irritable bowel syndrome)    Internal hemorrhoids    Colonoscopy 11/11 - Dr. Laural Golden   Interstitial cystitis    Migraine    Pelvic floor dysfunction    Sleep deprivation    Vulvodynia     Past Surgical History:  Procedure Laterality Date   ABDOMINAL HYSTERECTOMY     APPENDECTOMY     BLADDER SURGERY     02/2014 ileal neobladder secondary to severe interstitial cystitis    COLONOSCOPY WITH PROPOFOL N/A 12/09/2018   Procedure: COLONOSCOPY WITH PROPOFOL;  Surgeon: Rogene Houston, MD;  [facility];  Service: Endoscopy;  Laterality: N/A;   ESOPHAGOGASTRODUODENOSCOPY (EGD) WITH PROPOFOL N/A 12/09/2018   Procedure: ESOPHAGOGASTRODUODENOSCOPY (EGD) WITH PROPOFOL;  Surgeon: Rogene Houston, MD;  [facility];  Service: Endoscopy;  Laterality: N/A;  140pm    There were no vitals filed for this visit.   Subjective Assessment - 11/26/20 1037     Subjective Pt notes continued neck and upper back pain with increased symptoms past 2  days without known causative factor.    Currently in Pain? Yes    Pain Score 5     Pain Location Neck    Pain Orientation Right;Left    Pain Descriptors / Indicators Aching;Dull    Pain Type Acute pain;Chronic pain    Pain Radiating Towards T-spine    Pain Onset More than a month ago                [facility] PT Assessment - 11/26/20 0001       Assessment   Medical Diagnosis Myofascial Pain/ Cervicalgia                           [facility] Adult PT Treatment/Exercise - 11/26/20 0001       Neck Exercises: Machines for Strengthening   UBE (Upper Arm Bike) 2 min forward-backward level 1      Neck Exercises: Theraband   Shoulder External Rotation 20 reps;Red      Neck Exercises: Standing   Neck Retraction 20 reps    Other Standing Exercises horzontal abduction with red band 2x10      Neck Exercises: Stretches   Upper Trapezius Stretch Right;Left;3 reps;20 seconds  [place], [place], [address] Phone: [phone]   Fax:  [phone]  Physical Therapy Treatment  Patient Details  Name: Carol Thompson MRN: [id number] Date of Birth: Oct 18, 1965 Referring Provider (PT): Elfredia Nevins NP   Encounter Date: 11/26/2020   PT End of Session - 11/26/20 1039     Visit Number 4    Number of Visits 12    Date for PT Re-Evaluation 12/24/20    Authorization Type BCBS Medicare (no auth req, no visit limit)    Progress Note Due on Visit 10    PT Start Time 1035    PT Stop Time 1120    PT Time Calculation (min) 45 min    Activity Tolerance Patient limited by pain    Behavior During Therapy [facility] for tasks assessed/performed             Past Medical History:  Diagnosis Date   Acid reflux    Asthma    Cervical cancer ([place])    Chronic fatigue    Chronic pain    Cystitis, interstitial    Depression    Fibromyalgia    Hypertension    IBS (irritable bowel syndrome)    Internal hemorrhoids    Colonoscopy 11/11 - Dr. Laural Golden   Interstitial cystitis    Migraine    Pelvic floor dysfunction    Sleep deprivation    Vulvodynia     Past Surgical History:  Procedure Laterality Date   ABDOMINAL HYSTERECTOMY     APPENDECTOMY     BLADDER SURGERY     02/2014 ileal neobladder secondary to severe interstitial cystitis    COLONOSCOPY WITH PROPOFOL N/A 12/09/2018   Procedure: COLONOSCOPY WITH PROPOFOL;  Surgeon: Rogene Houston, MD;  [facility];  Service: Endoscopy;  Laterality: N/A;   ESOPHAGOGASTRODUODENOSCOPY (EGD) WITH PROPOFOL N/A 12/09/2018   Procedure: ESOPHAGOGASTRODUODENOSCOPY (EGD) WITH PROPOFOL;  Surgeon: Rogene Houston, MD;  [facility];  Service: Endoscopy;  Laterality: N/A;  140pm    There were no vitals filed for this visit.   Subjective Assessment - 11/26/20 1037     Subjective Pt notes continued neck and upper back pain with increased symptoms past 2  days without known causative factor.    Currently in Pain? Yes    Pain Score 5     Pain Location Neck    Pain Orientation Right;Left    Pain Descriptors / Indicators Aching;Dull    Pain Type Acute pain;Chronic pain    Pain Radiating Towards T-spine    Pain Onset More than a month ago                [facility] PT Assessment - 11/26/20 0001       Assessment   Medical Diagnosis Myofascial Pain/ Cervicalgia                           [facility] Adult PT Treatment/Exercise - 11/26/20 0001       Neck Exercises: Machines for Strengthening   UBE (Upper Arm Bike) 2 min forward-backward level 1      Neck Exercises: Theraband   Shoulder External Rotation 20 reps;Red      Neck Exercises: Standing   Neck Retraction 20 reps    Other Standing Exercises horzontal abduction with red band 2x10      Neck Exercises: Stretches   Upper Trapezius Stretch Right;Left;3 reps;20 seconds  Home Exercise Plan scap retractions    Consulted and Agree with Plan of Care Patient             Patient will benefit from skilled therapeutic intervention in order to improve the following deficits and impairments:  Decreased range of motion, Decreased endurance, Increased muscle spasms, Impaired UE functional use, Pain, Decreased activity tolerance, Impaired flexibility, Improper body mechanics, Postural dysfunction, Decreased strength, Decreased mobility  Visit Diagnosis: Cervicalgia  Other symptoms and signs involving the musculoskeletal system     Problem List Patient Active Problem List   Diagnosis Date Noted   Small bowel obstruction ([place]) 10/24/2019   GERD (gastroesophageal reflux disease) 10/24/2019   Upper abdominal pain 11/24/2018   Abdominal bloating 11/24/2018   Hematochezia 11/24/2018   Partial small bowel obstruction ([place]) 09/03/2015   Acute lower UTI 09/03/2015   Chronic interstitial cystitis 09/03/2015   Essential hypertension 09/03/2015   Fibromyalgia 09/03/2015   Atypical chest pain 06/02/2011   Bradycardia 05/27/2010   Essential hypertension, benign 05/27/2010   Palpitations 05/27/2010    Toniann Fail, PT 11/26/2020, 11:18 AM  [facility] [address] [place], [place], [address] Phone: [phone]   Fax:  [phone]  Name: Carol Thompson MRN: [id number] Date of Birth: 1965/04/16

## 2020-11-28 ENCOUNTER — Other Ambulatory Visit: Payer: Self-pay

## 2020-11-28 ENCOUNTER — Ambulatory Visit (HOSPITAL_COMMUNITY): Payer: Medicare Other | Admitting: Physical Therapy

## 2020-11-28 DIAGNOSIS — R29898 Other symptoms and signs involving the musculoskeletal system: Secondary | ICD-10-CM

## 2020-11-28 DIAGNOSIS — M542 Cervicalgia: Secondary | ICD-10-CM

## 2020-11-28 NOTE — Therapy (Signed)
[place], [place], [address] Phone: [phone]   Fax:  [phone]  Physical Therapy Treatment  Patient Details  Name: Carol Thompson MRN: [id number] Date of Birth: Dec 16, 1965 Referring Provider (PT): Elfredia Nevins NP   Encounter Date: 11/28/2020   PT End of Session - 11/28/20 1327     Visit Number 5    Number of Visits 12    Date for PT Re-Evaluation 12/24/20    Authorization Type BCBS Medicare (no auth req, no visit limit)    Progress Note Due on Visit 10    PT Start Time 1050    PT Stop Time 1130    PT Time Calculation (min) 40 min    Activity Tolerance Patient limited by pain    Behavior During Therapy [facility] for tasks assessed/performed             Past Medical History:  Diagnosis Date   Acid reflux    Asthma    Cervical cancer ([place])    Chronic fatigue    Chronic pain    Cystitis, interstitial    Depression    Fibromyalgia    Hypertension    IBS (irritable bowel syndrome)    Internal hemorrhoids    Colonoscopy 11/11 - Dr. Laural Golden   Interstitial cystitis    Migraine    Pelvic floor dysfunction    Sleep deprivation    Vulvodynia     Past Surgical History:  Procedure Laterality Date   ABDOMINAL HYSTERECTOMY     APPENDECTOMY     BLADDER SURGERY     02/2014 ileal neobladder secondary to severe interstitial cystitis    COLONOSCOPY WITH PROPOFOL N/A 12/09/2018   Procedure: COLONOSCOPY WITH PROPOFOL;  Surgeon: Rogene Houston, MD;  [facility];  Service: Endoscopy;  Laterality: N/A;   ESOPHAGOGASTRODUODENOSCOPY (EGD) WITH PROPOFOL N/A 12/09/2018   Procedure: ESOPHAGOGASTRODUODENOSCOPY (EGD) WITH PROPOFOL;  Surgeon: Rogene Houston, MD;  [facility];  Service: Endoscopy;  Laterality: N/A;  140pm    There were no vitals filed for this visit.   Subjective Assessment - 11/28/20 1053     Subjective Pt states MD put her on mobic and started steriods when she seen her Friday.   Did not write additional order for thoracic as it would be incorporated into cervical treatment.  STates it has improved a little since beginning those medications and currently 4/10 neck, upper spine and Rt shoulder.  States she felt the best the day we worked on the tight muscles.    Currently in Pain? Yes    Pain Score 4     Pain Location Neck    Pain Orientation Right    Pain Descriptors / Indicators Aching;Shooting;Sore    Pain Type Acute pain    Pain Radiating Towards into Rt shoulder to elbow and down thoracic spine                               [facility] Adult PT Treatment/Exercise - 11/28/20 0001       Neck Exercises: Machines for Strengthening   UBE (Upper Arm Bike) 4' total; 2'fwd/2'bkwd level 1      Neck Exercises: Seated   Other Seated Exercise cervical excursions 5X each    Other Seated Exercise thoracic excursions with UE movements 5X each      Manual Therapy   Manual Therapy Soft tissue mobilization  of motion, Decreased endurance, Increased muscle spasms, Impaired UE functional use, Pain, Decreased activity tolerance, Impaired flexibility, Improper body mechanics, Postural dysfunction, Decreased strength, Decreased mobility  Visit Diagnosis: Cervicalgia  Other symptoms and signs involving the musculoskeletal system     Problem List Patient Active Problem List   Diagnosis Date Noted   Small bowel obstruction ([place]) 10/24/2019   GERD (gastroesophageal reflux disease) 10/24/2019   Upper abdominal pain 11/24/2018   Abdominal bloating 11/24/2018   Hematochezia 11/24/2018   Partial small bowel obstruction ([place]) 09/03/2015   Acute lower UTI 09/03/2015   Chronic interstitial cystitis 09/03/2015   Essential hypertension 09/03/2015   Fibromyalgia 09/03/2015   Atypical chest pain 06/02/2011   Bradycardia 05/27/2010   Essential hypertension, benign 05/27/2010   Palpitations 05/27/2010   Teena Irani, PTA/CLT [phone]  Teena Irani, PTA 11/28/2020, 1:29 PM  [facility] [address] [place], [place], [address] Phone: [phone]   Fax:  [phone]  Name: Carol Thompson MRN: [id number] Date of Birth: 01/20/1966  of motion, Decreased endurance, Increased muscle spasms, Impaired UE functional use, Pain, Decreased activity tolerance, Impaired flexibility, Improper body mechanics, Postural dysfunction, Decreased strength, Decreased mobility  Visit Diagnosis: Cervicalgia  Other symptoms and signs involving the musculoskeletal system     Problem List Patient Active Problem List   Diagnosis Date Noted   Small bowel obstruction ([place]) 10/24/2019   GERD (gastroesophageal reflux disease) 10/24/2019   Upper abdominal pain 11/24/2018   Abdominal bloating 11/24/2018   Hematochezia 11/24/2018   Partial small bowel obstruction ([place]) 09/03/2015   Acute lower UTI 09/03/2015   Chronic interstitial cystitis 09/03/2015   Essential hypertension 09/03/2015   Fibromyalgia 09/03/2015   Atypical chest pain 06/02/2011   Bradycardia 05/27/2010   Essential hypertension, benign 05/27/2010   Palpitations 05/27/2010   Teena Irani, PTA/CLT [phone]  Teena Irani, PTA 11/28/2020, 1:29 PM  [facility] [address] [place], [place], [address] Phone: [phone]   Fax:  [phone]  Name: Carol Thompson MRN: [id number] Date of Birth: 01/20/1966

## 2020-12-03 ENCOUNTER — Ambulatory Visit (HOSPITAL_COMMUNITY): Payer: Medicare Other

## 2020-12-03 ENCOUNTER — Other Ambulatory Visit: Payer: Self-pay

## 2020-12-03 DIAGNOSIS — M542 Cervicalgia: Secondary | ICD-10-CM | POA: Diagnosis not present

## 2020-12-03 DIAGNOSIS — R29898 Other symptoms and signs involving the musculoskeletal system: Secondary | ICD-10-CM | POA: Diagnosis not present

## 2020-12-03 NOTE — Therapy (Signed)
[place], [place], [address] Phone: [phone]   Fax:  [phone]  Physical Therapy Treatment  Patient Details  Name: Carol Thompson MRN: [id number] Date of Birth: Jul 13, 1965 Referring Provider (PT): Elfredia Nevins NP   Encounter Date: 12/03/2020   PT End of Session - 12/03/20 1052     Visit Number 6    Number of Visits 12    Date for PT Re-Evaluation 12/24/20    Authorization Type BCBS Medicare (no auth req, no visit limit)    Progress Note Due on Visit 10    PT Start Time 1050    PT Stop Time 1115    PT Time Calculation (min) 25 min    Activity Tolerance Patient limited by pain    Behavior During Therapy [facility] for tasks assessed/performed             Past Medical History:  Diagnosis Date   Acid reflux    Asthma    Cervical cancer ([place])    Chronic fatigue    Chronic pain    Cystitis, interstitial    Depression    Fibromyalgia    Hypertension    IBS (irritable bowel syndrome)    Internal hemorrhoids    Colonoscopy 11/11 - Dr. Laural Golden   Interstitial cystitis    Migraine    Pelvic floor dysfunction    Sleep deprivation    Vulvodynia     Past Surgical History:  Procedure Laterality Date   ABDOMINAL HYSTERECTOMY     APPENDECTOMY     BLADDER SURGERY     02/2014 ileal neobladder secondary to severe interstitial cystitis    COLONOSCOPY WITH PROPOFOL N/A 12/09/2018   Procedure: COLONOSCOPY WITH PROPOFOL;  Surgeon: Rogene Houston, MD;  [facility];  Service: Endoscopy;  Laterality: N/A;   ESOPHAGOGASTRODUODENOSCOPY (EGD) WITH PROPOFOL N/A 12/09/2018   Procedure: ESOPHAGOGASTRODUODENOSCOPY (EGD) WITH PROPOFOL;  Surgeon: Rogene Houston, MD;  [facility];  Service: Endoscopy;  Laterality: N/A;  140pm    There were no vitals filed for this visit.   Subjective Assessment - 12/03/20 1101     Subjective Pt reports her upper back and right shoulder have been hurting and burning.   Notes continued general discomfort with certain head/neck motions. Pt notes she typically wakes every 2 hours due to pain/discomfort during the night    Patient Stated Goals decrease pain and play with grandchildren    Currently in Pain? Yes    Pain Score 6     Pain Location Neck   upper back   Pain Orientation Right    Pain Descriptors / Indicators Aching;Shooting    Pain Type Acute pain    Pain Onset More than a month ago                [facility] PT Assessment - 12/03/20 0001       Assessment   Medical Diagnosis Myofascial Pain/ Cervicalgia    Referring Provider (PT) Elfredia Nevins NP      Observation/Other Assessments   Focus on Therapeutic Outcomes (FOTO)  50.2% function      AROM   Cervical Flexion WNL    Cervical Extension 25% limited    Cervical - Right Side Bend 25% limited    Cervical - Left Side Bend WNL    Cervical - Right Rotation WNL    Cervical - Left Rotation WNL, painful  retractions    Consulted and Agree with Plan of Care Patient             Patient will benefit from skilled therapeutic intervention in order to improve the following deficits and impairments:  Decreased range of motion, Decreased endurance, Increased muscle spasms, Impaired UE functional use, Pain, Decreased activity tolerance, Impaired flexibility, Improper body mechanics, Postural dysfunction, Decreased strength, Decreased mobility  Visit Diagnosis: Cervicalgia  Other symptoms and signs involving the musculoskeletal system     Problem List Patient Active Problem List   Diagnosis Date Noted   Small bowel obstruction ([place]) 10/24/2019   GERD (gastroesophageal reflux disease) 10/24/2019   Upper abdominal pain 11/24/2018   Abdominal bloating 11/24/2018   Hematochezia 11/24/2018   Partial small bowel obstruction ([place]) 09/03/2015   Acute lower UTI 09/03/2015   Chronic interstitial cystitis 09/03/2015   Essential hypertension 09/03/2015   Fibromyalgia 09/03/2015   Atypical chest pain 06/02/2011   Bradycardia 05/27/2010   Essential hypertension, benign 05/27/2010   Palpitations 05/27/2010    11:30 AM, 12/03/20 M. Sherlyn Lees, PT, DPT Physical Therapist- [place] Office Number: [phone]   [place] [address] [place], [place], [address] Phone: [phone]   Fax:  [phone]  Name: Carol Thompson MRN: [id number] Date of Birth: 1965/08/28  [place], [place], [address] Phone: [phone]   Fax:  [phone]  Physical Therapy Treatment  Patient Details  Name: Carol Thompson MRN: [id number] Date of Birth: Jul 13, 1965 Referring Provider (PT): Elfredia Nevins NP   Encounter Date: 12/03/2020   PT End of Session - 12/03/20 1052     Visit Number 6    Number of Visits 12    Date for PT Re-Evaluation 12/24/20    Authorization Type BCBS Medicare (no auth req, no visit limit)    Progress Note Due on Visit 10    PT Start Time 1050    PT Stop Time 1115    PT Time Calculation (min) 25 min    Activity Tolerance Patient limited by pain    Behavior During Therapy [facility] for tasks assessed/performed             Past Medical History:  Diagnosis Date   Acid reflux    Asthma    Cervical cancer ([place])    Chronic fatigue    Chronic pain    Cystitis, interstitial    Depression    Fibromyalgia    Hypertension    IBS (irritable bowel syndrome)    Internal hemorrhoids    Colonoscopy 11/11 - Dr. Laural Golden   Interstitial cystitis    Migraine    Pelvic floor dysfunction    Sleep deprivation    Vulvodynia     Past Surgical History:  Procedure Laterality Date   ABDOMINAL HYSTERECTOMY     APPENDECTOMY     BLADDER SURGERY     02/2014 ileal neobladder secondary to severe interstitial cystitis    COLONOSCOPY WITH PROPOFOL N/A 12/09/2018   Procedure: COLONOSCOPY WITH PROPOFOL;  Surgeon: Rogene Houston, MD;  [facility];  Service: Endoscopy;  Laterality: N/A;   ESOPHAGOGASTRODUODENOSCOPY (EGD) WITH PROPOFOL N/A 12/09/2018   Procedure: ESOPHAGOGASTRODUODENOSCOPY (EGD) WITH PROPOFOL;  Surgeon: Rogene Houston, MD;  [facility];  Service: Endoscopy;  Laterality: N/A;  140pm    There were no vitals filed for this visit.   Subjective Assessment - 12/03/20 1101     Subjective Pt reports her upper back and right shoulder have been hurting and burning.   Notes continued general discomfort with certain head/neck motions. Pt notes she typically wakes every 2 hours due to pain/discomfort during the night    Patient Stated Goals decrease pain and play with grandchildren    Currently in Pain? Yes    Pain Score 6     Pain Location Neck   upper back   Pain Orientation Right    Pain Descriptors / Indicators Aching;Shooting    Pain Type Acute pain    Pain Onset More than a month ago                [facility] PT Assessment - 12/03/20 0001       Assessment   Medical Diagnosis Myofascial Pain/ Cervicalgia    Referring Provider (PT) Elfredia Nevins NP      Observation/Other Assessments   Focus on Therapeutic Outcomes (FOTO)  50.2% function      AROM   Cervical Flexion WNL    Cervical Extension 25% limited    Cervical - Right Side Bend 25% limited    Cervical - Left Side Bend WNL    Cervical - Right Rotation WNL    Cervical - Left Rotation WNL, painful

## 2020-12-05 ENCOUNTER — Encounter (HOSPITAL_COMMUNITY): Payer: Self-pay

## 2020-12-05 ENCOUNTER — Other Ambulatory Visit: Payer: Self-pay

## 2020-12-05 ENCOUNTER — Ambulatory Visit (HOSPITAL_COMMUNITY): Payer: Medicare Other

## 2020-12-05 DIAGNOSIS — R29898 Other symptoms and signs involving the musculoskeletal system: Secondary | ICD-10-CM | POA: Diagnosis not present

## 2020-12-05 DIAGNOSIS — M542 Cervicalgia: Secondary | ICD-10-CM | POA: Diagnosis not present

## 2020-12-05 NOTE — Therapy (Addendum)
[place], [place], [address] Phone: [phone]   Fax:  [phone]  Physical Therapy Treatment  Patient Details  Name: Carol Thompson MRN: [id number] Date of Birth: Mar 14, 1965 Referring Provider (PT): Elfredia Nevins NP   Encounter Date: 12/05/2020   PT End of Session - 12/05/20 1057     Visit Number 7    Number of Visits 12    Date for PT Re-Evaluation 12/24/20    Authorization Type BCBS Medicare (no auth req, no visit limit)    Progress Note Due on Visit 10    PT Start Time 1051    PT Stop Time 1129    PT Time Calculation (min) 38 min    Activity Tolerance Patient limited by pain;Patient tolerated treatment well    Behavior During Therapy [facility] for tasks assessed/performed             Past Medical History:  Diagnosis Date   Acid reflux    Asthma    Cervical cancer ([facility])    Chronic fatigue    Chronic pain    Cystitis, interstitial    Depression    Fibromyalgia    Hypertension    IBS (irritable bowel syndrome)    Internal hemorrhoids    Colonoscopy 11/11 - Dr. Laural Golden   Interstitial cystitis    Migraine    Pelvic floor dysfunction    Sleep deprivation    Vulvodynia     Past Surgical History:  Procedure Laterality Date   ABDOMINAL HYSTERECTOMY     APPENDECTOMY     BLADDER SURGERY     02/2014 ileal neobladder secondary to severe interstitial cystitis    COLONOSCOPY WITH PROPOFOL N/A 12/09/2018   Procedure: COLONOSCOPY WITH PROPOFOL;  Surgeon: Rogene Houston, MD;  [facility];  Service: Endoscopy;  Laterality: N/A;   ESOPHAGOGASTRODUODENOSCOPY (EGD) WITH PROPOFOL N/A 12/09/2018   Procedure: ESOPHAGOGASTRODUODENOSCOPY (EGD) WITH PROPOFOL;  Surgeon: Rogene Houston, MD;  [facility];  Service: Endoscopy;  Laterality: N/A;  140pm    There were no vitals filed for this visit.   Subjective Assessment - 12/05/20 1055     Subjective Pt stated her upper back and neck have  increased pain today, pain scale 6/10.    Patient Stated Goals decrease pain and play with grandchildren    Currently in Pain? Yes    Pain Score 6     Pain Location Neck    Pain Orientation Right;Left   Rt> Lt   Pain Type Acute pain    Pain Radiating Towards Rt shoulder to center of back    Pain Onset More than a month ago    Pain Frequency Constant    Aggravating Factors  turning    Pain Relieving Factors pain meds, heat                [facility] PT Assessment - 12/05/20 0001       Assessment   Medical Diagnosis Myofascial Pain/ Cervicalgia    Referring Provider (PT) Elfredia Nevins NP    Onset Date/Surgical Date 07/30/20    Next MD Visit 01/13/21                           [facility] Adult PT Treatment/Exercise - 12/05/20 0001       Neck Exercises: Seated   Shoulder Rolls Backwards;10 reps    Shoulder Rolls Limitations paired with breathing  Impaired UE functional use, Pain, Decreased activity tolerance, Impaired flexibility, Improper body mechanics, Postural dysfunction, Decreased strength, Decreased mobility  Visit Diagnosis: Cervicalgia  Other symptoms and signs involving the musculoskeletal system     Problem List Patient Active Problem List   Diagnosis Date Noted   Small bowel obstruction ([place]) 10/24/2019   GERD (gastroesophageal reflux disease) 10/24/2019   Upper abdominal pain 11/24/2018   Abdominal bloating 11/24/2018   Hematochezia 11/24/2018   Partial small bowel obstruction ([place]) 09/03/2015   Acute lower UTI 09/03/2015   Chronic interstitial cystitis 09/03/2015   Essential hypertension 09/03/2015   Fibromyalgia 09/03/2015   Atypical chest pain 06/02/2011   Bradycardia 05/27/2010   Essential hypertension, benign 05/27/2010   Palpitations 05/27/2010   Ihor Austin, LPTA/CLT; CBIS [phone]  Aldona Lento, PTA 12/05/2020, 11:32 AM  [place] [address] [place], [place], [address] Phone: [phone]   Fax:  [phone]  Name: Carol Thompson MRN: [id number] Date of Birth: 1966/01/09  [place], [place], [address] Phone: [phone]   Fax:  [phone]  Physical Therapy Treatment  Patient Details  Name: Carol Thompson MRN: [id number] Date of Birth: Mar 14, 1965 Referring Provider (PT): Elfredia Nevins NP   Encounter Date: 12/05/2020   PT End of Session - 12/05/20 1057     Visit Number 7    Number of Visits 12    Date for PT Re-Evaluation 12/24/20    Authorization Type BCBS Medicare (no auth req, no visit limit)    Progress Note Due on Visit 10    PT Start Time 1051    PT Stop Time 1129    PT Time Calculation (min) 38 min    Activity Tolerance Patient limited by pain;Patient tolerated treatment well    Behavior During Therapy [facility] for tasks assessed/performed             Past Medical History:  Diagnosis Date   Acid reflux    Asthma    Cervical cancer ([facility])    Chronic fatigue    Chronic pain    Cystitis, interstitial    Depression    Fibromyalgia    Hypertension    IBS (irritable bowel syndrome)    Internal hemorrhoids    Colonoscopy 11/11 - Dr. Laural Golden   Interstitial cystitis    Migraine    Pelvic floor dysfunction    Sleep deprivation    Vulvodynia     Past Surgical History:  Procedure Laterality Date   ABDOMINAL HYSTERECTOMY     APPENDECTOMY     BLADDER SURGERY     02/2014 ileal neobladder secondary to severe interstitial cystitis    COLONOSCOPY WITH PROPOFOL N/A 12/09/2018   Procedure: COLONOSCOPY WITH PROPOFOL;  Surgeon: Rogene Houston, MD;  [facility];  Service: Endoscopy;  Laterality: N/A;   ESOPHAGOGASTRODUODENOSCOPY (EGD) WITH PROPOFOL N/A 12/09/2018   Procedure: ESOPHAGOGASTRODUODENOSCOPY (EGD) WITH PROPOFOL;  Surgeon: Rogene Houston, MD;  [facility];  Service: Endoscopy;  Laterality: N/A;  140pm    There were no vitals filed for this visit.   Subjective Assessment - 12/05/20 1055     Subjective Pt stated her upper back and neck have  increased pain today, pain scale 6/10.    Patient Stated Goals decrease pain and play with grandchildren    Currently in Pain? Yes    Pain Score 6     Pain Location Neck    Pain Orientation Right;Left   Rt> Lt   Pain Type Acute pain    Pain Radiating Towards Rt shoulder to center of back    Pain Onset More than a month ago    Pain Frequency Constant    Aggravating Factors  turning    Pain Relieving Factors pain meds, heat                [facility] PT Assessment - 12/05/20 0001       Assessment   Medical Diagnosis Myofascial Pain/ Cervicalgia    Referring Provider (PT) Elfredia Nevins NP    Onset Date/Surgical Date 07/30/20    Next MD Visit 01/13/21                           [facility] Adult PT Treatment/Exercise - 12/05/20 0001       Neck Exercises: Seated   Shoulder Rolls Backwards;10 reps    Shoulder Rolls Limitations paired with breathing

## 2020-12-10 ENCOUNTER — Other Ambulatory Visit: Payer: Self-pay

## 2020-12-10 ENCOUNTER — Ambulatory Visit (HOSPITAL_COMMUNITY): Payer: Medicare Other | Attending: Nurse Practitioner

## 2020-12-10 DIAGNOSIS — R29898 Other symptoms and signs involving the musculoskeletal system: Secondary | ICD-10-CM | POA: Diagnosis not present

## 2020-12-10 DIAGNOSIS — M542 Cervicalgia: Secondary | ICD-10-CM | POA: Insufficient documentation

## 2020-12-10 NOTE — Therapy (Signed)
[place], [place], [address] Phone: [phone]   Fax:  [phone]  Physical Therapy Treatment  Patient Details  Name: Carol Thompson MRN: [id number] Date of Birth: 1966/02/15 Referring Provider (PT): Elfredia Nevins NP   Encounter Date: 12/10/2020   PT End of Session - 12/10/20 1119     Visit Number 8    Number of Visits 12    Date for PT Re-Evaluation 12/24/20    Authorization Type BCBS Medicare (no auth req, no visit limit)    Progress Note Due on Visit 10    PT Start Time 1119    PT Stop Time 1200    PT Time Calculation (min) 41 min    Activity Tolerance Patient limited by pain;Patient tolerated treatment well    Behavior During Therapy [facility] for tasks assessed/performed             Past Medical History:  Diagnosis Date   Acid reflux    Asthma    Cervical cancer ([facility])    Chronic fatigue    Chronic pain    Cystitis, interstitial    Depression    Fibromyalgia    Hypertension    IBS (irritable bowel syndrome)    Internal hemorrhoids    Colonoscopy 11/11 - Dr. Laural Golden   Interstitial cystitis    Migraine    Pelvic floor dysfunction    Sleep deprivation    Vulvodynia     Past Surgical History:  Procedure Laterality Date   ABDOMINAL HYSTERECTOMY     APPENDECTOMY     BLADDER SURGERY     02/2014 ileal neobladder secondary to severe interstitial cystitis    COLONOSCOPY WITH PROPOFOL N/A 12/09/2018   Procedure: COLONOSCOPY WITH PROPOFOL;  Surgeon: Rogene Houston, MD;  [facility];  Service: Endoscopy;  Laterality: N/A;   ESOPHAGOGASTRODUODENOSCOPY (EGD) WITH PROPOFOL N/A 12/09/2018   Procedure: ESOPHAGOGASTRODUODENOSCOPY (EGD) WITH PROPOFOL;  Surgeon: Rogene Houston, MD;  [facility];  Service: Endoscopy;  Laterality: N/A;  140pm    There were no vitals filed for this visit.   Subjective Assessment - 12/10/20 1122     Subjective "Neck and upper back pain continue, a little  less discomfort today, about 4-5/10"    Patient Stated Goals decrease pain and play with grandchildren    Currently in Pain? Yes    Pain Score 5     Pain Location Neck    Pain Orientation Right;Left   upper thoracic   Pain Onset More than a month ago                               [facility] Adult PT Treatment/Exercise - 12/10/20 0001       Neck Exercises: Machines for Strengthening   UBE (Upper Arm Bike) 4' total; 2'fwd/2'bkwd level 1    Lat Pull 2 plates 1x10    Power Tower single arm row 2 plates 1x10      Neck Exercises: Land 2 reps;60 seconds    Other Neck Stretches wall slide 1x10 3 sec hold                     PT Education - 12/10/20 1158     Education Details education on PRE as it pertains to FM, e.g. increased repetition vs increased resistance. Education/demonstration of fitness facility equipment to use for scapular/upper  [place], [place], [address] Phone: [phone]   Fax:  [phone]  Physical Therapy Treatment  Patient Details  Name: Carol Thompson MRN: [id number] Date of Birth: 1966/02/15 Referring Provider (PT): Elfredia Nevins NP   Encounter Date: 12/10/2020   PT End of Session - 12/10/20 1119     Visit Number 8    Number of Visits 12    Date for PT Re-Evaluation 12/24/20    Authorization Type BCBS Medicare (no auth req, no visit limit)    Progress Note Due on Visit 10    PT Start Time 1119    PT Stop Time 1200    PT Time Calculation (min) 41 min    Activity Tolerance Patient limited by pain;Patient tolerated treatment well    Behavior During Therapy [facility] for tasks assessed/performed             Past Medical History:  Diagnosis Date   Acid reflux    Asthma    Cervical cancer ([facility])    Chronic fatigue    Chronic pain    Cystitis, interstitial    Depression    Fibromyalgia    Hypertension    IBS (irritable bowel syndrome)    Internal hemorrhoids    Colonoscopy 11/11 - Dr. Laural Golden   Interstitial cystitis    Migraine    Pelvic floor dysfunction    Sleep deprivation    Vulvodynia     Past Surgical History:  Procedure Laterality Date   ABDOMINAL HYSTERECTOMY     APPENDECTOMY     BLADDER SURGERY     02/2014 ileal neobladder secondary to severe interstitial cystitis    COLONOSCOPY WITH PROPOFOL N/A 12/09/2018   Procedure: COLONOSCOPY WITH PROPOFOL;  Surgeon: Rogene Houston, MD;  [facility];  Service: Endoscopy;  Laterality: N/A;   ESOPHAGOGASTRODUODENOSCOPY (EGD) WITH PROPOFOL N/A 12/09/2018   Procedure: ESOPHAGOGASTRODUODENOSCOPY (EGD) WITH PROPOFOL;  Surgeon: Rogene Houston, MD;  [facility];  Service: Endoscopy;  Laterality: N/A;  140pm    There were no vitals filed for this visit.   Subjective Assessment - 12/10/20 1122     Subjective "Neck and upper back pain continue, a little  less discomfort today, about 4-5/10"    Patient Stated Goals decrease pain and play with grandchildren    Currently in Pain? Yes    Pain Score 5     Pain Location Neck    Pain Orientation Right;Left   upper thoracic   Pain Onset More than a month ago                               [facility] Adult PT Treatment/Exercise - 12/10/20 0001       Neck Exercises: Machines for Strengthening   UBE (Upper Arm Bike) 4' total; 2'fwd/2'bkwd level 1    Lat Pull 2 plates 1x10    Power Tower single arm row 2 plates 1x10      Neck Exercises: Land 2 reps;60 seconds    Other Neck Stretches wall slide 1x10 3 sec hold                     PT Education - 12/10/20 1158     Education Details education on PRE as it pertains to FM, e.g. increased repetition vs increased resistance. Education/demonstration of fitness facility equipment to use for scapular/upper  Problem List Patient Active Problem List   Diagnosis Date Noted   Small bowel obstruction ([place]) 10/24/2019   GERD (gastroesophageal reflux disease) 10/24/2019   Upper abdominal pain 11/24/2018   Abdominal bloating 11/24/2018   Hematochezia 11/24/2018   Partial small bowel obstruction ([place]) 09/03/2015   Acute lower UTI 09/03/2015   Chronic interstitial cystitis 09/03/2015   Essential hypertension 09/03/2015   Fibromyalgia 09/03/2015   Atypical chest pain 06/02/2011   Bradycardia 05/27/2010   Essential hypertension, benign 05/27/2010   Palpitations 05/27/2010   12:02 PM, 12/10/20 M. Sherlyn Lees, PT, DPT Physical Therapist- [place] Office Number: [phone]   [place] [address] [place], [place], [address] Phone: [phone]   Fax:  [phone]  Name: RIGBY SWAMY MRN: [id number] Date of Birth: 1965-05-31

## 2020-12-12 ENCOUNTER — Encounter (HOSPITAL_COMMUNITY): Payer: Medicare Other

## 2020-12-12 ENCOUNTER — Telehealth (HOSPITAL_COMMUNITY): Payer: Self-pay

## 2020-12-12 NOTE — Telephone Encounter (Signed)
Patient l/m to cx she is sick today and will not be here

## 2020-12-16 DIAGNOSIS — R339 Retention of urine, unspecified: Secondary | ICD-10-CM | POA: Diagnosis not present

## 2020-12-17 ENCOUNTER — Encounter (HOSPITAL_COMMUNITY): Payer: Self-pay

## 2020-12-17 ENCOUNTER — Other Ambulatory Visit: Payer: Self-pay

## 2020-12-17 ENCOUNTER — Ambulatory Visit (HOSPITAL_COMMUNITY): Payer: Medicare Other

## 2020-12-17 DIAGNOSIS — M542 Cervicalgia: Secondary | ICD-10-CM | POA: Diagnosis not present

## 2020-12-17 DIAGNOSIS — R29898 Other symptoms and signs involving the musculoskeletal system: Secondary | ICD-10-CM | POA: Diagnosis not present

## 2020-12-17 NOTE — Therapy (Signed)
[facility] [address] [place], [place], [address] Phone: [phone]   Fax:  [phone]  Physical Therapy Treatment and Progress Note  Patient Details  Name: Carol Thompson MRN: [id number] Date of Birth: Jul 30, 1965 Referring Provider (PT): Elfredia Nevins NP  Progress Note Reporting Period 11/12/20 to 12/17/20  See note below for Objective Data and Assessment of Progress/Goals.     Encounter Date: 12/17/2020   PT End of Session - 12/17/20 1114     Visit Number 9    Number of Visits 12    Date for PT Re-Evaluation 12/24/20    Authorization Type BCBS Medicare (no auth req, no visit limit)    Progress Note Due on Visit 19    PT Start Time 1115    PT Stop Time 1200    PT Time Calculation (min) 45 min    Activity Tolerance Patient limited by pain;Patient tolerated treatment well    Behavior During Therapy [facility] for tasks assessed/performed             Past Medical History:  Diagnosis Date   Acid reflux    Asthma    Cervical cancer (HCC)    Chronic fatigue    Chronic pain    Cystitis, interstitial    Depression    Fibromyalgia    Hypertension    IBS (irritable bowel syndrome)    Internal hemorrhoids    Colonoscopy 11/11 - Dr. Laural Golden   Interstitial cystitis    Migraine    Pelvic floor dysfunction    Sleep deprivation    Vulvodynia     Past Surgical History:  Procedure Laterality Date   ABDOMINAL HYSTERECTOMY     APPENDECTOMY     BLADDER SURGERY     02/2014 ileal neobladder secondary to severe interstitial cystitis    COLONOSCOPY WITH PROPOFOL N/A 12/09/2018   Procedure: COLONOSCOPY WITH PROPOFOL;  Surgeon: Rogene Houston, MD;  [facility];  Service: Endoscopy;  Laterality: N/A;   ESOPHAGOGASTRODUODENOSCOPY (EGD) WITH PROPOFOL N/A 12/09/2018   Procedure: ESOPHAGOGASTRODUODENOSCOPY (EGD) WITH PROPOFOL;  Surgeon: Rogene Houston, MD;  [facility];  Service: Endoscopy;  Laterality: N/A;  140pm     There were no vitals filed for this visit.   Subjective Assessment - 12/17/20 1112     Subjective Neck and upper back between shoulder blades continue to ache/throb, not a constant throb    Patient Stated Goals decrease pain and play with grandchildren    Currently in Pain? Yes    Pain Score 5     Pain Location Neck    Pain Orientation Right;Left;Mid    Pain Descriptors / Indicators Aching;Throbbing    Pain Type Acute pain    Pain Onset More than a month ago                [facility] PT Assessment - 12/17/20 0001       Assessment   Medical Diagnosis Myofascial Pain/ Cervicalgia    Referring Provider (PT) Elfredia Nevins NP    Onset Date/Surgical Date 07/30/20      Observation/Other Assessments   Focus on Therapeutic Outcomes (FOTO)  53% function      AROM   Cervical Flexion WNL    Cervical Extension WNL    Cervical - Right Side Bend WNL    Cervical - Left Side Bend WNL    Cervical - Right Rotation WNL    Cervical - Left Rotation 25% limited  Examination-Activity Limitations Reach Overhead;Bend;Carry;Lift;Sleep     Examination-Participation Restrictions Meal Prep;Cleaning;Community Activity;Shop;Driving;Volunteer;[facility] Le[facility]    Stability/Clinical Decision Making Evolving/Moderate complexity    Rehab Potential Good    PT Frequency 2x / week    PT Duration 6 weeks    PT Treatment/Interventions ADLs/Self Care Home Management;Aquatic Therapy;Cryotherapy;Electrical Stimulation;Iontophoresis 4mg /ml Dexamethasone;Moist Heat;Traction;Ultrasound;Parrafin;DME Instruction;Gait training;Stair training;Functional mobility training;Therapeutic activities;Therapeutic exercise;Balance training;Neuromuscular re-education;Patient/family education;Manual techniques;Compression bandaging;Scar mobilization;Passive range of motion;Dry needling;Energy conservation;Splinting;Taping;Spinal Manipulations;Joint Manipulations    PT Next Visit Plan Continue manual to reduce spasm/pain.  Progress cervical ROM, postural strengthening    PT Home Exercise Plan scap retractions    Consulted and Agree with Plan of Care Patient             Patient will benefit from skilled therapeutic intervention in order to improve the following deficits and impairments:  Decreased range of motion, Decreased endurance, Increased muscle spasms, Impaired UE functional use, Pain, Decreased activity tolerance, Impaired flexibility, Improper body mechanics, Postural dysfunction, Decreased strength, Decreased mobility  Visit Diagnosis: Cervicalgia  Other symptoms and signs involving the musculoskeletal system     Problem List Patient Active Problem List   Diagnosis Date Noted   Small bowel obstruction ([place]) 10/24/2019   GERD (gastroesophageal reflux disease) 10/24/2019   Upper abdominal pain 11/24/2018   Abdominal bloating 11/24/2018   Hematochezia 11/24/2018   Partial small bowel obstruction ([place]) 09/03/2015   Acute lower UTI 09/03/2015   Chronic interstitial cystitis 09/03/2015   Essential hypertension 09/03/2015   Fibromyalgia  09/03/2015   Atypical chest pain 06/02/2011   Bradycardia 05/27/2010   Essential hypertension, benign 05/27/2010   Palpitations 05/27/2010    Toniann Fail, PT 12/17/2020, 12:10 PM  [facility] [address] [place], [place], [address] Phone: [phone]   Fax:  [phone]  Name: KHALIE WINCE MRN: [id number] Date of Birth: 01/18/1966  [facility] [address] [place], [place], [address] Phone: [phone]   Fax:  [phone]  Physical Therapy Treatment and Progress Note  Patient Details  Name: Carol Thompson MRN: [id number] Date of Birth: Jul 30, 1965 Referring Provider (PT): Elfredia Nevins NP  Progress Note Reporting Period 11/12/20 to 12/17/20  See note below for Objective Data and Assessment of Progress/Goals.     Encounter Date: 12/17/2020   PT End of Session - 12/17/20 1114     Visit Number 9    Number of Visits 12    Date for PT Re-Evaluation 12/24/20    Authorization Type BCBS Medicare (no auth req, no visit limit)    Progress Note Due on Visit 19    PT Start Time 1115    PT Stop Time 1200    PT Time Calculation (min) 45 min    Activity Tolerance Patient limited by pain;Patient tolerated treatment well    Behavior During Therapy [facility] for tasks assessed/performed             Past Medical History:  Diagnosis Date   Acid reflux    Asthma    Cervical cancer (HCC)    Chronic fatigue    Chronic pain    Cystitis, interstitial    Depression    Fibromyalgia    Hypertension    IBS (irritable bowel syndrome)    Internal hemorrhoids    Colonoscopy 11/11 - Dr. Laural Golden   Interstitial cystitis    Migraine    Pelvic floor dysfunction    Sleep deprivation    Vulvodynia     Past Surgical History:  Procedure Laterality Date   ABDOMINAL HYSTERECTOMY     APPENDECTOMY     BLADDER SURGERY     02/2014 ileal neobladder secondary to severe interstitial cystitis    COLONOSCOPY WITH PROPOFOL N/A 12/09/2018   Procedure: COLONOSCOPY WITH PROPOFOL;  Surgeon: Rogene Houston, MD;  [facility];  Service: Endoscopy;  Laterality: N/A;   ESOPHAGOGASTRODUODENOSCOPY (EGD) WITH PROPOFOL N/A 12/09/2018   Procedure: ESOPHAGOGASTRODUODENOSCOPY (EGD) WITH PROPOFOL;  Surgeon: Rogene Houston, MD;  [facility];  Service: Endoscopy;  Laterality: N/A;  140pm     There were no vitals filed for this visit.   Subjective Assessment - 12/17/20 1112     Subjective Neck and upper back between shoulder blades continue to ache/throb, not a constant throb    Patient Stated Goals decrease pain and play with grandchildren    Currently in Pain? Yes    Pain Score 5     Pain Location Neck    Pain Orientation Right;Left;Mid    Pain Descriptors / Indicators Aching;Throbbing    Pain Type Acute pain    Pain Onset More than a month ago                [facility] PT Assessment - 12/17/20 0001       Assessment   Medical Diagnosis Myofascial Pain/ Cervicalgia    Referring Provider (PT) Elfredia Nevins NP    Onset Date/Surgical Date 07/30/20      Observation/Other Assessments   Focus on Therapeutic Outcomes (FOTO)  53% function      AROM   Cervical Flexion WNL    Cervical Extension WNL    Cervical - Right Side Bend WNL    Cervical - Left Side Bend WNL    Cervical - Right Rotation WNL    Cervical - Left Rotation 25% limited

## 2020-12-19 ENCOUNTER — Encounter (HOSPITAL_COMMUNITY): Payer: Medicare Other

## 2020-12-19 ENCOUNTER — Telehealth (HOSPITAL_COMMUNITY): Payer: Self-pay

## 2020-12-19 NOTE — Telephone Encounter (Signed)
Thought to be a no show.  Called and spoke to pt who stated she called earlier and cancelled apt as unable to make it.  Reminded next apt date and time with contact number included.  Ihor Austin, LPTA/CLT; Delana Meyer [phone]

## 2020-12-24 ENCOUNTER — Encounter (HOSPITAL_COMMUNITY): Payer: Self-pay | Admitting: Physical Therapy

## 2020-12-24 ENCOUNTER — Other Ambulatory Visit: Payer: Self-pay

## 2020-12-24 ENCOUNTER — Ambulatory Visit (HOSPITAL_COMMUNITY): Payer: Medicare Other | Admitting: Physical Therapy

## 2020-12-24 DIAGNOSIS — M542 Cervicalgia: Secondary | ICD-10-CM

## 2020-12-24 DIAGNOSIS — R29898 Other symptoms and signs involving the musculoskeletal system: Secondary | ICD-10-CM | POA: Diagnosis not present

## 2020-12-24 DIAGNOSIS — M85852 Other specified disorders of bone density and structure, left thigh: Secondary | ICD-10-CM | POA: Diagnosis not present

## 2020-12-24 DIAGNOSIS — M810 Age-related osteoporosis without current pathological fracture: Secondary | ICD-10-CM | POA: Diagnosis not present
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
## 2020-12-27 ENCOUNTER — Encounter (HOSPITAL_COMMUNITY): Payer: Medicare Other

## 2020-12-30 DIAGNOSIS — R300 Dysuria: Secondary | ICD-10-CM | POA: Diagnosis not present

## 2021-01-14 DIAGNOSIS — R339 Retention of urine, unspecified: Secondary | ICD-10-CM | POA: Diagnosis not present

## 2021-01-16 DIAGNOSIS — M810 Age-related osteoporosis without current pathological fracture: Secondary | ICD-10-CM | POA: Insufficient documentation

## 2021-01-21 DIAGNOSIS — M542 Cervicalgia: Secondary | ICD-10-CM | POA: Diagnosis not present

## 2021-02-05 DIAGNOSIS — E038 Other specified hypothyroidism: Secondary | ICD-10-CM | POA: Diagnosis not present

## 2021-02-05 DIAGNOSIS — M797 Fibromyalgia: Secondary | ICD-10-CM | POA: Diagnosis not present

## 2021-02-05 DIAGNOSIS — G43009 Migraine without aura, not intractable, without status migrainosus: Secondary | ICD-10-CM | POA: Diagnosis not present

## 2021-02-05 DIAGNOSIS — I10 Essential (primary) hypertension: Secondary | ICD-10-CM | POA: Diagnosis not present

## 2021-02-06 DIAGNOSIS — G8929 Other chronic pain: Secondary | ICD-10-CM | POA: Diagnosis not present

## 2021-02-06 DIAGNOSIS — M542 Cervicalgia: Secondary | ICD-10-CM | POA: Diagnosis not present

## 2021-02-06 DIAGNOSIS — M4802 Spinal stenosis, cervical region: Secondary | ICD-10-CM | POA: Diagnosis not present

## 2021-02-06 DIAGNOSIS — M50321 Other cervical disc degeneration at C4-C5 level: Secondary | ICD-10-CM | POA: Diagnosis not present

## 2021-02-14 DIAGNOSIS — R339 Retention of urine, unspecified: Secondary | ICD-10-CM | POA: Diagnosis not present

## 2021-03-18 DIAGNOSIS — R339 Retention of urine, unspecified: Secondary | ICD-10-CM | POA: Diagnosis not present

## 2021-04-03 DIAGNOSIS — Z9889 Other specified postprocedural states: Secondary | ICD-10-CM | POA: Diagnosis not present

## 2021-04-03 DIAGNOSIS — N3010 Interstitial cystitis (chronic) without hematuria: Secondary | ICD-10-CM | POA: Diagnosis not present

## 2021-04-03 DIAGNOSIS — N2889 Other specified disorders of kidney and ureter: Secondary | ICD-10-CM | POA: Diagnosis not present

## 2021-04-04 DIAGNOSIS — Z1231 Encounter for screening mammogram for malignant neoplasm of breast: Secondary | ICD-10-CM | POA: Diagnosis not present

## 2021-04-17 DIAGNOSIS — R339 Retention of urine, unspecified: Secondary | ICD-10-CM | POA: Diagnosis not present

## 2021-04-18 DIAGNOSIS — I10 Essential (primary) hypertension: Secondary | ICD-10-CM | POA: Diagnosis not present

## 2021-04-18 DIAGNOSIS — E559 Vitamin D deficiency, unspecified: Secondary | ICD-10-CM | POA: Diagnosis not present

## 2021-04-18 DIAGNOSIS — E038 Other specified hypothyroidism: Secondary | ICD-10-CM | POA: Diagnosis not present

## 2021-04-18 DIAGNOSIS — G43909 Migraine, unspecified, not intractable, without status migrainosus: Secondary | ICD-10-CM | POA: Diagnosis not present

## 2021-04-23 DIAGNOSIS — G90512 Complex regional pain syndrome I of left upper limb: Secondary | ICD-10-CM | POA: Diagnosis not present

## 2021-04-23 DIAGNOSIS — M797 Fibromyalgia: Secondary | ICD-10-CM | POA: Diagnosis not present

## 2021-04-23 DIAGNOSIS — M5030 Other cervical disc degeneration, unspecified cervical region: Secondary | ICD-10-CM | POA: Diagnosis not present

## 2021-04-23 DIAGNOSIS — M5136 Other intervertebral disc degeneration, lumbar region: Secondary | ICD-10-CM | POA: Diagnosis not present

## 2021-05-02 DIAGNOSIS — G90512 Complex regional pain syndrome I of left upper limb: Secondary | ICD-10-CM | POA: Diagnosis not present

## 2021-05-02 DIAGNOSIS — M47812 Spondylosis without myelopathy or radiculopathy, cervical region: Secondary | ICD-10-CM | POA: Diagnosis not present

## 2021-05-02 DIAGNOSIS — M797 Fibromyalgia: Secondary | ICD-10-CM | POA: Diagnosis not present

## 2021-05-02 DIAGNOSIS — G894 Chronic pain syndrome: Secondary | ICD-10-CM | POA: Diagnosis not present

## 2021-05-16 DIAGNOSIS — Z9889 Other specified postprocedural states: Secondary | ICD-10-CM | POA: Diagnosis not present

## 2021-05-16 DIAGNOSIS — N2889 Other specified disorders of kidney and ureter: Secondary | ICD-10-CM | POA: Diagnosis not present

## 2021-05-19 DIAGNOSIS — R339 Retention of urine, unspecified: Secondary | ICD-10-CM | POA: Diagnosis not present

## 2021-05-27 DIAGNOSIS — M47812 Spondylosis without myelopathy or radiculopathy, cervical region: Secondary | ICD-10-CM | POA: Diagnosis not present

## 2021-06-03 DIAGNOSIS — M797 Fibromyalgia: Secondary | ICD-10-CM | POA: Diagnosis not present

## 2021-06-03 DIAGNOSIS — M47812 Spondylosis without myelopathy or radiculopathy, cervical region: Secondary | ICD-10-CM | POA: Diagnosis not present

## 2021-06-03 DIAGNOSIS — N3010 Interstitial cystitis (chronic) without hematuria: Secondary | ICD-10-CM | POA: Diagnosis not present

## 2021-06-03 DIAGNOSIS — G894 Chronic pain syndrome: Secondary | ICD-10-CM | POA: Diagnosis not present

## 2021-06-16 DIAGNOSIS — D519 Vitamin B12 deficiency anemia, unspecified: Secondary | ICD-10-CM | POA: Diagnosis not present

## 2021-06-16 DIAGNOSIS — D529 Folate deficiency anemia, unspecified: Secondary | ICD-10-CM | POA: Diagnosis not present

## 2021-06-16 DIAGNOSIS — I10 Essential (primary) hypertension: Secondary | ICD-10-CM | POA: Diagnosis not present

## 2021-06-16 DIAGNOSIS — D649 Anemia, unspecified: Secondary | ICD-10-CM | POA: Diagnosis not present

## 2021-06-18 DIAGNOSIS — R339 Retention of urine, unspecified: Secondary | ICD-10-CM | POA: Diagnosis not present

## 2021-06-23 DIAGNOSIS — M47812 Spondylosis without myelopathy or radiculopathy, cervical region: Secondary | ICD-10-CM | POA: Diagnosis not present

## 2021-06-30 DIAGNOSIS — M797 Fibromyalgia: Secondary | ICD-10-CM | POA: Diagnosis not present

## 2021-06-30 DIAGNOSIS — G90512 Complex regional pain syndrome I of left upper limb: Secondary | ICD-10-CM | POA: Diagnosis not present

## 2021-06-30 DIAGNOSIS — G894 Chronic pain syndrome: Secondary | ICD-10-CM | POA: Diagnosis not present

## 2021-07-08 DIAGNOSIS — M797 Fibromyalgia: Secondary | ICD-10-CM | POA: Diagnosis not present

## 2021-07-08 DIAGNOSIS — G894 Chronic pain syndrome: Secondary | ICD-10-CM | POA: Diagnosis not present

## 2021-07-08 DIAGNOSIS — G90512 Complex regional pain syndrome I of left upper limb: Secondary | ICD-10-CM | POA: Diagnosis not present

## 2021-07-18 DIAGNOSIS — R339 Retention of urine, unspecified: Secondary | ICD-10-CM | POA: Diagnosis not present

## 2021-08-01 DIAGNOSIS — G894 Chronic pain syndrome: Secondary | ICD-10-CM | POA: Diagnosis not present

## 2021-08-01 DIAGNOSIS — M797 Fibromyalgia: Secondary | ICD-10-CM | POA: Diagnosis not present

## 2021-08-14 DIAGNOSIS — M47812 Spondylosis without myelopathy or radiculopathy, cervical region: Secondary | ICD-10-CM | POA: Diagnosis not present

## 2021-08-14 DIAGNOSIS — G894 Chronic pain syndrome: Secondary | ICD-10-CM | POA: Diagnosis not present

## 2021-08-14 DIAGNOSIS — M797 Fibromyalgia: Secondary | ICD-10-CM | POA: Diagnosis not present

## 2021-09-15 DIAGNOSIS — R339 Retention of urine, unspecified: Secondary | ICD-10-CM | POA: Diagnosis not present

## 2021-10-15 DIAGNOSIS — R339 Retention of urine, unspecified: Secondary | ICD-10-CM | POA: Diagnosis not present

## 2021-10-20 DIAGNOSIS — M47812 Spondylosis without myelopathy or radiculopathy, cervical region: Secondary | ICD-10-CM | POA: Diagnosis not present

## 2021-10-20 DIAGNOSIS — Z5181 Encounter for therapeutic drug level monitoring: Secondary | ICD-10-CM | POA: Diagnosis not present

## 2021-10-20 DIAGNOSIS — G894 Chronic pain syndrome: Secondary | ICD-10-CM | POA: Diagnosis not present

## 2021-10-20 DIAGNOSIS — Z79899 Other long term (current) drug therapy: Secondary | ICD-10-CM | POA: Diagnosis not present

## 2021-10-20 DIAGNOSIS — M797 Fibromyalgia: Secondary | ICD-10-CM | POA: Diagnosis not present

## 2021-10-29 DIAGNOSIS — R110 Nausea: Secondary | ICD-10-CM | POA: Diagnosis not present

## 2021-10-29 DIAGNOSIS — Z9889 Other specified postprocedural states: Secondary | ICD-10-CM | POA: Diagnosis not present

## 2021-10-29 DIAGNOSIS — Z48816 Encounter for surgical aftercare following surgery on the genitourinary system: Secondary | ICD-10-CM | POA: Diagnosis not present

## 2021-11-17 DIAGNOSIS — R339 Retention of urine, unspecified: Secondary | ICD-10-CM | POA: Diagnosis not present

## 2021-11-18 DIAGNOSIS — Z79899 Other long term (current) drug therapy: Secondary | ICD-10-CM | POA: Diagnosis not present

## 2021-11-18 DIAGNOSIS — Z5181 Encounter for therapeutic drug level monitoring: Secondary | ICD-10-CM | POA: Diagnosis not present

## 2021-11-26 DIAGNOSIS — M47812 Spondylosis without myelopathy or radiculopathy, cervical region: Secondary | ICD-10-CM | POA: Diagnosis not present

## 2021-12-15 DIAGNOSIS — M797 Fibromyalgia: Secondary | ICD-10-CM | POA: Diagnosis not present

## 2021-12-15 DIAGNOSIS — G894 Chronic pain syndrome: Secondary | ICD-10-CM | POA: Diagnosis not present

## 2021-12-15 DIAGNOSIS — G90512 Complex regional pain syndrome I of left upper limb: Secondary | ICD-10-CM | POA: Diagnosis not present

## 2021-12-17 DIAGNOSIS — R339 Retention of urine, unspecified: Secondary | ICD-10-CM | POA: Diagnosis not present

## 2021-12-22 DIAGNOSIS — Z906 Acquired absence of other parts of urinary tract: Secondary | ICD-10-CM | POA: Diagnosis not present

## 2021-12-22 DIAGNOSIS — Z8541 Personal history of malignant neoplasm of cervix uteri: Secondary | ICD-10-CM | POA: Diagnosis not present

## 2021-12-22 DIAGNOSIS — N9489 Other specified conditions associated with female genital organs and menstrual cycle: Secondary | ICD-10-CM | POA: Diagnosis not present

## 2021-12-22 DIAGNOSIS — R102 Pelvic and perineal pain: Secondary | ICD-10-CM | POA: Diagnosis not present

## 2022-01-07 DIAGNOSIS — I10 Essential (primary) hypertension: Secondary | ICD-10-CM | POA: Diagnosis not present

## 2022-01-07 DIAGNOSIS — Z8551 Personal history of malignant neoplasm of bladder: Secondary | ICD-10-CM | POA: Diagnosis not present

## 2022-01-07 DIAGNOSIS — E038 Other specified hypothyroidism: Secondary | ICD-10-CM | POA: Diagnosis not present

## 2022-01-07 DIAGNOSIS — R102 Pelvic and perineal pain: Secondary | ICD-10-CM | POA: Diagnosis not present

## 2022-01-07 DIAGNOSIS — Z8541 Personal history of malignant neoplasm of cervix uteri: Secondary | ICD-10-CM | POA: Diagnosis not present

## 2022-01-07 DIAGNOSIS — N941 Unspecified dyspareunia: Secondary | ICD-10-CM | POA: Diagnosis not present

## 2022-01-07 DIAGNOSIS — E559 Vitamin D deficiency, unspecified: Secondary | ICD-10-CM | POA: Diagnosis not present

## 2022-01-07 DIAGNOSIS — Z13220 Encounter for screening for lipoid disorders: Secondary | ICD-10-CM | POA: Diagnosis not present

## 2022-01-14 DIAGNOSIS — I10 Essential (primary) hypertension: Secondary | ICD-10-CM | POA: Diagnosis not present

## 2022-01-14 DIAGNOSIS — E038 Other specified hypothyroidism: Secondary | ICD-10-CM | POA: Diagnosis not present

## 2022-01-14 DIAGNOSIS — E559 Vitamin D deficiency, unspecified: Secondary | ICD-10-CM | POA: Diagnosis not present

## 2022-01-14 DIAGNOSIS — G43909 Migraine, unspecified, not intractable, without status migrainosus: Secondary | ICD-10-CM | POA: Diagnosis not present

## 2022-01-16 DIAGNOSIS — R339 Retention of urine, unspecified: Secondary | ICD-10-CM | POA: Diagnosis not present

## 2022-02-10 DIAGNOSIS — G894 Chronic pain syndrome: Secondary | ICD-10-CM | POA: Diagnosis not present

## 2022-02-10 DIAGNOSIS — Z79899 Other long term (current) drug therapy: Secondary | ICD-10-CM | POA: Diagnosis not present

## 2022-02-10 DIAGNOSIS — G90512 Complex regional pain syndrome I of left upper limb: Secondary | ICD-10-CM | POA: Diagnosis not present

## 2022-02-10 DIAGNOSIS — M797 Fibromyalgia: Secondary | ICD-10-CM | POA: Diagnosis not present

## 2022-02-10 DIAGNOSIS — Z5181 Encounter for therapeutic drug level monitoring: Secondary | ICD-10-CM | POA: Diagnosis not present

## 2022-02-16 DIAGNOSIS — R339 Retention of urine, unspecified: Secondary | ICD-10-CM | POA: Diagnosis not present

## 2022-03-04 DIAGNOSIS — D519 Vitamin B12 deficiency anemia, unspecified: Secondary | ICD-10-CM | POA: Diagnosis not present

## 2022-03-04 DIAGNOSIS — D649 Anemia, unspecified: Secondary | ICD-10-CM | POA: Diagnosis not present

## 2022-03-04 DIAGNOSIS — I10 Essential (primary) hypertension: Secondary | ICD-10-CM | POA: Diagnosis not present

## 2022-03-04 DIAGNOSIS — D529 Folate deficiency anemia, unspecified: Secondary | ICD-10-CM | POA: Diagnosis not present

## 2022-03-18 DIAGNOSIS — R339 Retention of urine, unspecified: Secondary | ICD-10-CM | POA: Diagnosis not present

## 2022-03-30 DIAGNOSIS — G43909 Migraine, unspecified, not intractable, without status migrainosus: Secondary | ICD-10-CM | POA: Diagnosis not present

## 2022-03-30 DIAGNOSIS — E038 Other specified hypothyroidism: Secondary | ICD-10-CM | POA: Diagnosis not present

## 2022-03-30 DIAGNOSIS — R079 Chest pain, unspecified: Secondary | ICD-10-CM | POA: Diagnosis not present

## 2022-03-30 DIAGNOSIS — I10 Essential (primary) hypertension: Secondary | ICD-10-CM | POA: Diagnosis not present

## 2022-03-30 DIAGNOSIS — E559 Vitamin D deficiency, unspecified: Secondary | ICD-10-CM | POA: Diagnosis not present

## 2022-04-07 DIAGNOSIS — G90512 Complex regional pain syndrome I of left upper limb: Secondary | ICD-10-CM | POA: Diagnosis not present

## 2022-04-07 DIAGNOSIS — G894 Chronic pain syndrome: Secondary | ICD-10-CM | POA: Diagnosis not present

## 2022-04-07 DIAGNOSIS — M797 Fibromyalgia: Secondary | ICD-10-CM | POA: Diagnosis not present

## 2022-05-01 DIAGNOSIS — D529 Folate deficiency anemia, unspecified: Secondary | ICD-10-CM | POA: Diagnosis not present

## 2022-05-01 DIAGNOSIS — R5382 Chronic fatigue, unspecified: Secondary | ICD-10-CM | POA: Diagnosis not present

## 2022-05-01 DIAGNOSIS — D649 Anemia, unspecified: Secondary | ICD-10-CM | POA: Diagnosis not present

## 2022-05-01 DIAGNOSIS — I10 Essential (primary) hypertension: Secondary | ICD-10-CM | POA: Diagnosis not present

## 2022-05-11 ENCOUNTER — Telehealth: Payer: Self-pay | Admitting: Internal Medicine

## 2022-05-11 ENCOUNTER — Ambulatory Visit: Payer: Medicare HMO | Attending: Internal Medicine | Admitting: Internal Medicine

## 2022-05-11 ENCOUNTER — Telehealth: Payer: Self-pay | Admitting: Cardiovascular Disease

## 2022-05-11 ENCOUNTER — Encounter: Payer: Self-pay | Admitting: *Deleted

## 2022-05-11 ENCOUNTER — Encounter: Payer: Self-pay | Admitting: Internal Medicine

## 2022-05-11 VITALS — BP 128/72 | HR 80 | Ht 61.0 in | Wt 154.0 lb

## 2022-05-11 DIAGNOSIS — D649 Anemia, unspecified: Secondary | ICD-10-CM | POA: Diagnosis not present

## 2022-05-11 DIAGNOSIS — E559 Vitamin D deficiency, unspecified: Secondary | ICD-10-CM | POA: Diagnosis not present

## 2022-05-11 DIAGNOSIS — R519 Headache, unspecified: Secondary | ICD-10-CM | POA: Diagnosis not present

## 2022-05-11 DIAGNOSIS — G43909 Migraine, unspecified, not intractable, without status migrainosus: Secondary | ICD-10-CM | POA: Diagnosis not present

## 2022-05-11 DIAGNOSIS — R42 Dizziness and giddiness: Secondary | ICD-10-CM | POA: Diagnosis not present

## 2022-05-11 DIAGNOSIS — I10 Essential (primary) hypertension: Secondary | ICD-10-CM | POA: Diagnosis not present

## 2022-05-11 DIAGNOSIS — R079 Chest pain, unspecified: Secondary | ICD-10-CM | POA: Diagnosis not present

## 2022-05-11 DIAGNOSIS — E038 Other specified hypothyroidism: Secondary | ICD-10-CM | POA: Diagnosis not present

## 2022-05-11 DIAGNOSIS — Z6827 Body mass index (BMI) 27.0-27.9, adult: Secondary | ICD-10-CM | POA: Diagnosis not present

## 2022-05-11 NOTE — Progress Notes (Signed)
Cardiology Office Note  Date: 05/11/2022   ID: Carol Thompson, Carol Thompson 10-27-1965, MRN [id number]  PCP:  Selinda Flavin, MD  Cardiologist:  Nona Dell, MD Electrophysiologist:  None   Reason for Office Visit: Evaluation chest pain at the request of Dr. Dimas Aguas   History of Present Illness: Carol Thompson is a 57 y.o. female known to have fibromyalgia, HTN was referred to cardiology clinic for evaluation chest pain.  Patient has been having substernal chest pains x 2 months, nonradiating, lasting few seconds to a minute, occurs with exertion and resolves with rest. Frequency once per week. Chest pain frequency improved after candesartan dose was increased 1 month ago, no chest pains are occurring once in every 2 weeks. Associated with SOB and palpitations. Palpitations also occur when she does not have chest pain and happens once in a while. She has fibromyalgia and cannot sleep well at night. Denies dizziness, syncope, leg swelling. Denied smoking cigarettes, however she had secondhand exposure to cigarette smoke all her life from her father and father-in-law.  Labs reviewed from the PCPs office, CBC showed normal hemoglobin and low MCV, 78.5. Currently on iron supplements.  Past Medical History:  Diagnosis Date   Acid reflux    Asthma    Cervical cancer (HCC)    Chronic fatigue    Chronic pain    Cystitis, interstitial    Depression    Fibromyalgia    Hypertension    IBS (irritable bowel syndrome)    Internal hemorrhoids    Colonoscopy 11/11 - Dr. Karilyn Cota   Interstitial cystitis    Migraine    Pelvic floor dysfunction    Sleep deprivation    Vulvodynia     Past Surgical History:  Procedure Laterality Date   ABDOMINAL HYSTERECTOMY     APPENDECTOMY     BLADDER SURGERY     02/2014 ileal neobladder secondary to severe interstitial cystitis    COLONOSCOPY WITH PROPOFOL N/A 12/09/2018   Procedure: COLONOSCOPY WITH PROPOFOL;  Surgeon: Malissa Hippo, MD;  [facility];  Service: Endoscopy;  Laterality: N/A;   ESOPHAGOGASTRODUODENOSCOPY (EGD) WITH PROPOFOL N/A 12/09/2018   Procedure: ESOPHAGOGASTRODUODENOSCOPY (EGD) WITH PROPOFOL;  Surgeon: Malissa Hippo, MD;  [facility]: [facility];  Service: Endoscopy;  Laterality: N/A;  140pm    Current Outpatient Medications  Medication Sig Dispense Refill   albuterol (VENTOLIN HFA) 108 (90 BASE) MCG/ACT inhaler Inhale 2 puffs into the lungs every 6 (six) hours as needed for wheezing or shortness of breath.      BELBUCA 300 MCG FILM Place inside cheek in the morning and at bedtime.     candesartan (ATACAND) 8 MG tablet Take 8 mg by mouth daily.     estradiol (ESTRACE) 2 MG tablet Take 2 mg by mouth every evening.      ibuprofen (ADVIL) 200 MG tablet Take 200 mg by mouth every 8 (eight) hours as needed (pain.).      lidocaine (XYLOCAINE) 2 % jelly Place 1 application into the urethra 6 (six) times daily.      ondansetron (ZOFRAN) 8 MG tablet Take 4 mg by mouth every 8 (eight) hours as needed for nausea or vomiting.      promethazine (PHENERGAN) 25 MG tablet Take 25 mg by mouth daily as needed (nausea not resolved by zofran.).     amitriptyline (ELAVIL) 25 MG tablet Take 25 mg by mouth at bedtime.     beclomethasone (QVAR) 40 MCG/ACT inhaler Inhale 1  puff into the lungs daily.      fluticasone (FLONASE) 50 MCG/ACT nasal spray Place 2 sprays into both nostrils every evening.      metoprolol (TOPROL-XL) 100 MG 24 hr tablet Take 100 mg by mouth daily.       omeprazole (PRILOSEC) 20 MG capsule Take 1 capsule (20 mg total) by mouth daily before breakfast. (Patient not taking: Reported on 10/24/2019)     oxyCODONE-acetaminophen (PERCOCET) 10-325 MG tablet Take 1 tablet by mouth every 6 (six) hours as needed.     polyethylene glycol (MIRALAX / GLYCOLAX) 17 g packet Take 17 g by mouth daily. 14 each 0   tiZANidine (ZANAFLEX) 4 MG tablet Take 4 mg by mouth 2 (two) times daily.     XTAMPZA ER 9 MG C12A Take 9 mg by  mouth 2 (two) times daily.     No current facility-administered medications for this visit.   Allergies:  Ciprofloxacin, Ferric carboxymaltose, and Gabapentin   Social History: The patient  reports that she has never smoked. She has never used smokeless tobacco. She reports that she does not drink alcohol and does not use drugs.   Family History: The patient's family history includes Coronary artery disease in her father; Kidney failure in her mother; Lung cancer in her father; Parkinsonism in her brother.   ROS:  Please see the history of present illness. Otherwise, complete review of systems is positive for none.  All other systems are reviewed and negative.   Physical Exam: VS:  Ht 5\' 1"  (1.549 m)   Wt 154 lb (69.9 kg)   BMI 29.10 kg/m , BMI Body mass index is 29.1 kg/m.  Wt Readings from Last 3 Encounters:  05/11/22 154 lb (69.9 kg)  10/24/19 154 lb 5.2 oz (70 kg)  11/24/18 152 lb 1.6 oz (69 kg)    General: Patient appears comfortable at rest. HEENT: Conjunctiva and lids normal, oropharynx clear with moist mucosa. Neck: Supple, no elevated JVP or carotid bruits, no thyromegaly. Lungs: Clear to auscultation, nonlabored breathing at rest. Cardiac: Regular rate and rhythm, no S3 or significant systolic murmur, no pericardial rub. Abdomen: Soft, nontender, no hepatomegaly, bowel sounds present, no guarding or rebound. Extremities: No pitting edema, distal pulses 2+. Skin: Warm and dry. Musculoskeletal: No kyphosis. Neuropsychiatric: Alert and oriented x3, affect grossly appropriate.  ECG: I personally reviewed the EKG from PCPs office which showed normal sinus rhythm and no ST-T changes  Recent Labwork: No results found for requested labs within last 365 days.  No results found for: "CHOL", "TRIG", "HDL", "CHOLHDL", "VLDL", "LDLCALC", "LDLDIRECT"  Other Studies Reviewed Today: Cardiac event monitor in 2019 Normal sinus rhythm, average HR 74 bpm  Echocardiogram in  2013 Normal LVEF No valve abnormalities  Assessment and Plan: Patient is a 57 year old F known to have HTN, fibromyalgia was referred to cardiology clinic for chest pain.  # Possibly cardiac chest pain -Patient has been having substernal chest pains x 2 months, nonradiating, lasting few seconds to a minute, occurs with exertion and resolves with rest. Frequency once per week. Chest pain frequency improved after candesartan dose was increased 1 month ago, no chest pains are occurring once in every 2 weeks. Due to risk factors of HTN and secondhand exposure to cigarette smoke, she will benefit from [place]. Last echocardiogram from 2013 showed normal LVEF and no valve abnormalities. No need of repeat echo, no murmur on physical exam.  # HTN, controlled -Continue candesartan 8 mg once daily -Check blood pressures  every day, follow-up with PCP  I have spent a total of 45 minutes with patient reviewing chart, EKGs, labs and examining patient as well as establishing an assessment and plan that was discussed with the patient.  > 50% of time was spent in direct patient care.      Medication Adjustments/Labs and Tests Ordered: Current medicines are reviewed at length with the patient today.  Concerns regarding medicines are outlined above.   Tests Ordered: Orders Placed This Encounter  Procedures   NM Myocar Multi W/Spect W/Wall Motion / EF      Medication Changes: No orders of the defined types were placed in this encounter.   Disposition:  Follow up prn  Signed Londyn Hotard Verne Spurr, MD, 05/11/2022 8:17 AM    [facility] [address] [place], [place], [place] [address]

## 2022-05-11 NOTE — Patient Instructions (Addendum)
Medication Instructions:  Your physician recommends that you continue on your current medications as directed. Please refer to the Current Medication list given to you today.   Labwork: none  Testing/Procedures: Your physician has requested that you have a lexiscan myoview. For further information please visit [email or web address]. Please follow instruction sheet, as given.    Follow-Up: Your physician recommends that you schedule a follow-up appointment in: as needed    Any Other Special Instructions Will Be Listed Below (If Applicable).     If you need a refill on your cardiac medications before your next appointment, please call your pharmacy.   

## 2022-05-11 NOTE — Telephone Encounter (Signed)
error 

## 2022-05-11 NOTE — Telephone Encounter (Signed)
Checking percert on the following patient for testing scheduled at [facility].   Lexiscan   05/19/2022

## 2022-05-12 ENCOUNTER — Encounter: Payer: Self-pay | Admitting: Internal Medicine

## 2022-05-18 DIAGNOSIS — Z1231 Encounter for screening mammogram for malignant neoplasm of breast: Secondary | ICD-10-CM | POA: Diagnosis not present

## 2022-05-18 DIAGNOSIS — R92323 Mammographic fibroglandular density, bilateral breasts: Secondary | ICD-10-CM | POA: Diagnosis not present

## 2022-05-19 ENCOUNTER — Ambulatory Visit (HOSPITAL_COMMUNITY)
Admission: RE | Admit: 2022-05-19 | Discharge: 2022-05-19 | Disposition: A | Discharge status: Home / Self Care | Payer: Medicare HMO | Source: Ambulatory Visit | Attending: Internal Medicine | Admitting: Internal Medicine

## 2022-05-19 ENCOUNTER — Encounter (HOSPITAL_COMMUNITY)
Admission: RE | Admit: 2022-05-19 | Discharge: 2022-05-19 | Disposition: A | Discharge status: Home / Self Care | Payer: Medicare HMO | Source: Ambulatory Visit | Attending: Internal Medicine | Admitting: Internal Medicine

## 2022-05-19 ENCOUNTER — Encounter (HOSPITAL_COMMUNITY): Payer: Self-pay

## 2022-05-19 DIAGNOSIS — R079 Chest pain, unspecified: Secondary | ICD-10-CM | POA: Insufficient documentation

## 2022-05-19 LAB — NM MYOCAR MULTI W/SPECT W/WALL MOTION / EF
Base ST Depression (mm): 0 mm
LV dias vol: 49 mL (ref 46–106)
LV sys vol: 7 mL
Nuc Stress EF: 85 %
Peak HR: 123 {beats}/min
RATE: 0.3
Rest HR: 69 {beats}/min
Rest Nuclear Isotope Dose: 11 mCi
SDS: 0
SRS: 0
SSS: 0
ST Depression (mm): 0 mm
Stress Nuclear Isotope Dose: 33 mCi
TID: 1.07

## 2022-05-19 MED ORDER — TECHNETIUM TC 99M TETROFOSMIN IV KIT
30.0000 | PACK | Freq: Once | INTRAVENOUS | Status: AC | PRN
  Administered 2022-05-19: 33 via INTRAVENOUS

## 2022-05-19 MED ORDER — REGADENOSON 0.4 MG/5ML IV SOLN
INTRAVENOUS | Status: AC
  Administered 2022-05-19: 0.4 mg via INTRAVENOUS
  Filled 2022-05-19: qty 5

## 2022-05-19 MED ORDER — TECHNETIUM TC 99M TETROFOSMIN IV KIT
10.0000 | PACK | Freq: Once | INTRAVENOUS | Status: AC | PRN
  Administered 2022-05-19: 11 via INTRAVENOUS

## 2022-05-19 MED ORDER — SODIUM CHLORIDE FLUSH 0.9 % IV SOLN
INTRAVENOUS | Status: AC
  Administered 2022-05-19: 10 mL via INTRAVENOUS
  Filled 2022-05-19: qty 10

## 2022-05-20 DIAGNOSIS — R339 Retention of urine, unspecified: Secondary | ICD-10-CM | POA: Diagnosis not present

## 2022-05-27 DIAGNOSIS — Z79899 Other long term (current) drug therapy: Secondary | ICD-10-CM | POA: Diagnosis not present

## 2022-05-27 DIAGNOSIS — M47812 Spondylosis without myelopathy or radiculopathy, cervical region: Secondary | ICD-10-CM | POA: Diagnosis not present

## 2022-05-27 DIAGNOSIS — M47816 Spondylosis without myelopathy or radiculopathy, lumbar region: Secondary | ICD-10-CM | POA: Diagnosis not present

## 2022-05-27 DIAGNOSIS — Z5181 Encounter for therapeutic drug level monitoring: Secondary | ICD-10-CM | POA: Diagnosis not present

## 2022-05-27 DIAGNOSIS — N3010 Interstitial cystitis (chronic) without hematuria: Secondary | ICD-10-CM | POA: Diagnosis not present

## 2022-05-27 DIAGNOSIS — G894 Chronic pain syndrome: Secondary | ICD-10-CM | POA: Diagnosis not present

## 2022-06-18 DIAGNOSIS — R339 Retention of urine, unspecified: Secondary | ICD-10-CM | POA: Diagnosis not present

## 2022-06-25 DIAGNOSIS — M797 Fibromyalgia: Secondary | ICD-10-CM | POA: Diagnosis not present

## 2022-06-25 DIAGNOSIS — G894 Chronic pain syndrome: Secondary | ICD-10-CM | POA: Diagnosis not present

## 2022-06-25 DIAGNOSIS — M47812 Spondylosis without myelopathy or radiculopathy, cervical region: Secondary | ICD-10-CM | POA: Diagnosis not present

## 2022-06-25 DIAGNOSIS — G90512 Complex regional pain syndrome I of left upper limb: Secondary | ICD-10-CM | POA: Diagnosis not present

## 2022-07-01 DIAGNOSIS — R42 Dizziness and giddiness: Secondary | ICD-10-CM | POA: Insufficient documentation

## 2022-07-02 ENCOUNTER — Encounter: Payer: Self-pay | Admitting: Neurology

## 2022-07-02 ENCOUNTER — Ambulatory Visit: Payer: Medicare HMO | Admitting: Neurology

## 2022-07-02 ENCOUNTER — Telehealth: Payer: Self-pay | Admitting: Neurology

## 2022-07-02 VITALS — BP 144/80 | HR 80 | Ht 61.0 in | Wt 143.0 lb

## 2022-07-02 DIAGNOSIS — R42 Dizziness and giddiness: Secondary | ICD-10-CM | POA: Diagnosis not present

## 2022-07-02 DIAGNOSIS — R2689 Other abnormalities of gait and mobility: Secondary | ICD-10-CM | POA: Diagnosis not present

## 2022-07-02 DIAGNOSIS — H8111 Benign paroxysmal vertigo, right ear: Secondary | ICD-10-CM

## 2022-07-02 MED ORDER — MECLIZINE HCL 12.5 MG PO TABS: 12.5000 mg | ORAL_TABLET | Freq: Three times a day (TID) | ORAL | 0 refills | Status: AC | PRN

## 2022-07-02 NOTE — Patient Instructions (Signed)
MRI brain without contrast Trial of meclizine before bedtime Referral to vestibular therapy Follow-up in 6 months or sooner if worse

## 2022-07-02 NOTE — Telephone Encounter (Signed)
sent to GI they obtain [facility] Berkley Harvey [phone]
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
## 2022-07-07 DIAGNOSIS — I10 Essential (primary) hypertension: Secondary | ICD-10-CM | POA: Diagnosis not present

## 2022-07-07 DIAGNOSIS — E038 Other specified hypothyroidism: Secondary | ICD-10-CM | POA: Diagnosis not present

## 2022-07-07 DIAGNOSIS — R5382 Chronic fatigue, unspecified: Secondary | ICD-10-CM | POA: Diagnosis not present

## 2022-07-07 DIAGNOSIS — E559 Vitamin D deficiency, unspecified: Secondary | ICD-10-CM | POA: Diagnosis not present

## 2022-07-07 DIAGNOSIS — D529 Folate deficiency anemia, unspecified: Secondary | ICD-10-CM | POA: Diagnosis not present

## 2022-07-07 DIAGNOSIS — Z131 Encounter for screening for diabetes mellitus: Secondary | ICD-10-CM | POA: Diagnosis not present

## 2022-07-07 DIAGNOSIS — D519 Vitamin B12 deficiency anemia, unspecified: Secondary | ICD-10-CM | POA: Diagnosis not present

## 2022-07-07 DIAGNOSIS — Z13220 Encounter for screening for lipoid disorders: Secondary | ICD-10-CM | POA: Diagnosis not present

## 2022-07-15 ENCOUNTER — Encounter: Payer: Self-pay | Admitting: Neurology

## 2022-07-15 DIAGNOSIS — G43909 Migraine, unspecified, not intractable, without status migrainosus: Secondary | ICD-10-CM | POA: Diagnosis not present

## 2022-07-15 DIAGNOSIS — Z0001 Encounter for general adult medical examination with abnormal findings: Secondary | ICD-10-CM | POA: Diagnosis not present

## 2022-07-15 DIAGNOSIS — I10 Essential (primary) hypertension: Secondary | ICD-10-CM | POA: Diagnosis not present

## 2022-07-15 DIAGNOSIS — R42 Dizziness and giddiness: Secondary | ICD-10-CM | POA: Diagnosis not present

## 2022-07-15 DIAGNOSIS — E559 Vitamin D deficiency, unspecified: Secondary | ICD-10-CM | POA: Diagnosis not present

## 2022-07-15 DIAGNOSIS — R079 Chest pain, unspecified: Secondary | ICD-10-CM | POA: Diagnosis not present

## 2022-07-15 DIAGNOSIS — Z6827 Body mass index (BMI) 27.0-27.9, adult: Secondary | ICD-10-CM | POA: Diagnosis not present

## 2022-07-15 DIAGNOSIS — N312 Flaccid neuropathic bladder, not elsewhere classified: Secondary | ICD-10-CM | POA: Diagnosis not present

## 2022-07-15 DIAGNOSIS — R519 Headache, unspecified: Secondary | ICD-10-CM | POA: Diagnosis not present

## 2022-07-15 DIAGNOSIS — D649 Anemia, unspecified: Secondary | ICD-10-CM | POA: Diagnosis not present

## 2022-07-15 DIAGNOSIS — E038 Other specified hypothyroidism: Secondary | ICD-10-CM | POA: Diagnosis not present

## 2022-07-18 ENCOUNTER — Ambulatory Visit
Admission: RE | Admit: 2022-07-18 | Discharge: 2022-07-18 | Disposition: A | Discharge status: Home / Self Care | Payer: Medicare HMO | Source: Ambulatory Visit | Attending: Neurology | Admitting: Neurology

## 2022-07-18 DIAGNOSIS — R2689 Other abnormalities of gait and mobility: Secondary | ICD-10-CM

## 2022-07-18 DIAGNOSIS — H8111 Benign paroxysmal vertigo, right ear: Secondary | ICD-10-CM

## 2022-07-18 DIAGNOSIS — R42 Dizziness and giddiness: Secondary | ICD-10-CM

## 2022-07-20 DIAGNOSIS — R339 Retention of urine, unspecified: Secondary | ICD-10-CM | POA: Diagnosis not present

## 2022-07-22 IMAGING — CT CT ABD-PELV W/ CM
2 of 5 series · 16 of 46 positions shown, 18 images · IV contrast (Omnipaque or Isovue)
Comparison: CT 09/03/2015

CLINICAL DATA: Abdominal pain with nausea and vomiting. Bowel
obstruction suspected. History of small-bowel obstruction.

EXAM:
CT ABDOMEN AND PELVIS WITH CONTRAST
TECHNIQUE: Multidetector CT imaging of the abdomen and pelvis was performed
using the standard protocol following bolus administration of
intravenous contrast.
CONTRAST:  100mL OMNIPAQUE IOHEXOL 300 MG/ML  SOLN

[Series 2: axial st · axial · 0.67mm/px · z∈[-311,+84]mm · 13 of 91 slices shown, 15 images]
[im 6/91  soft-tissue]
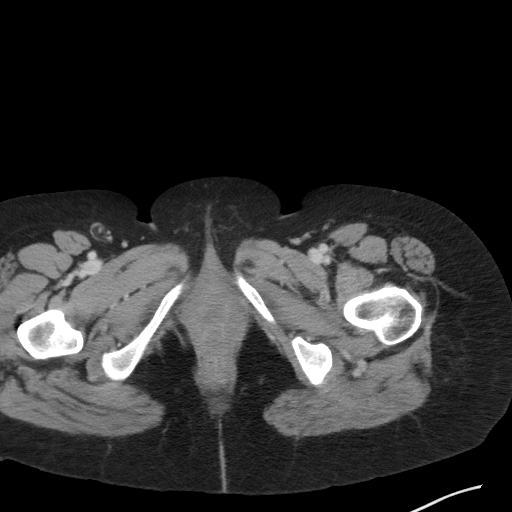
[im 6/91  bone]
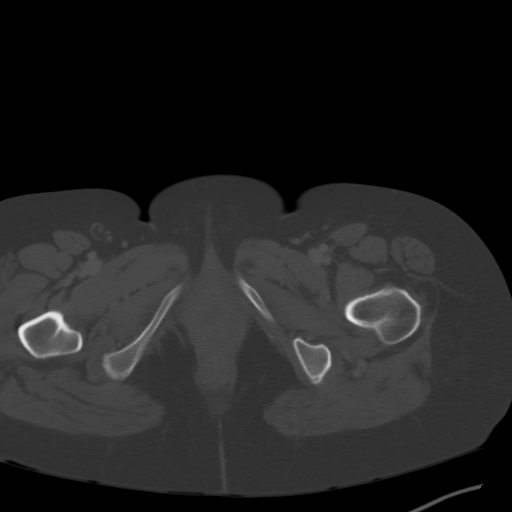
[im 12/91  soft-tissue]
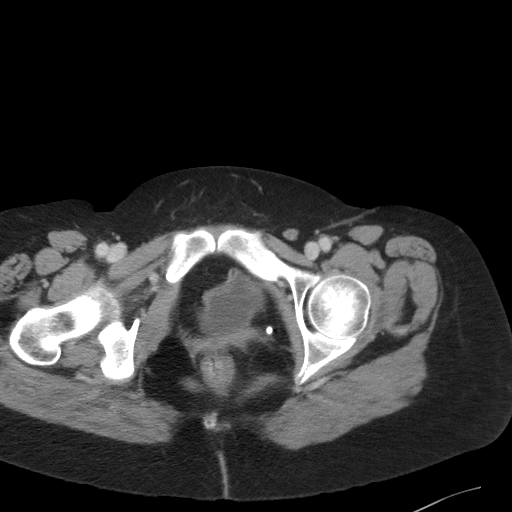
[im 17/91  soft-tissue]
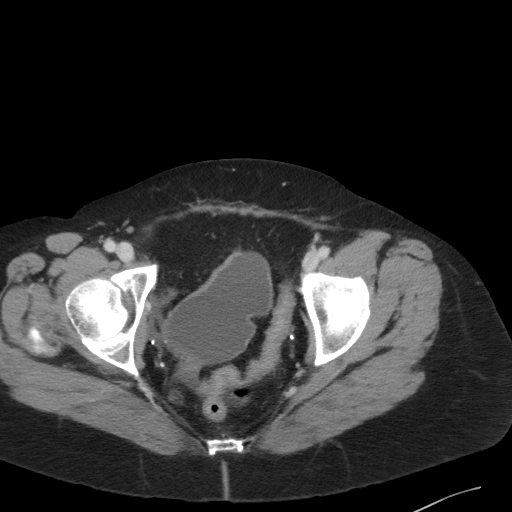
[im 29/91  soft-tissue]
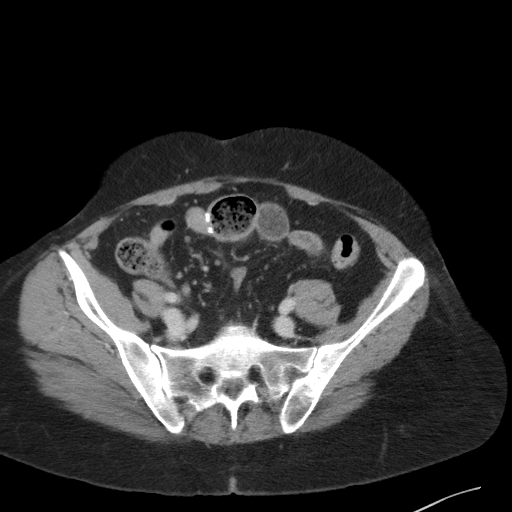
[im 34/91  soft-tissue]
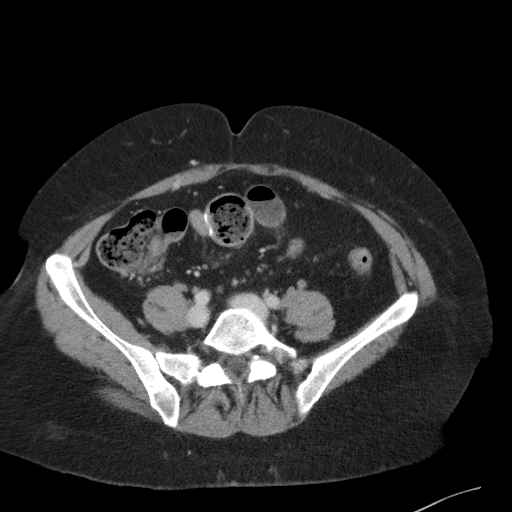
[im 40/91  soft-tissue]
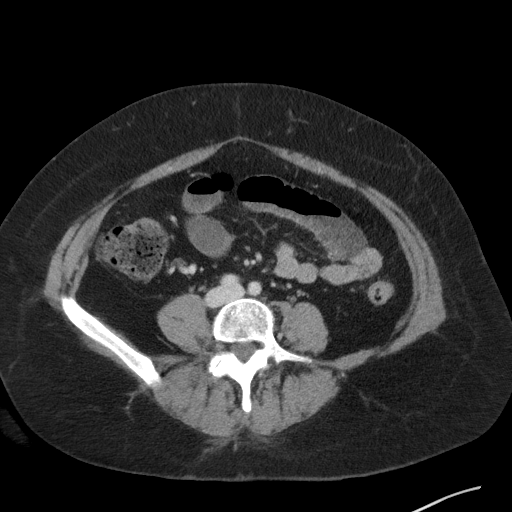
[im 46/91  soft-tissue]
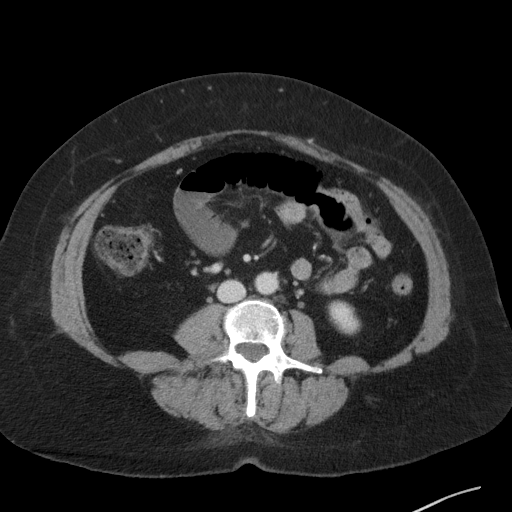
[im 51/91  soft-tissue]
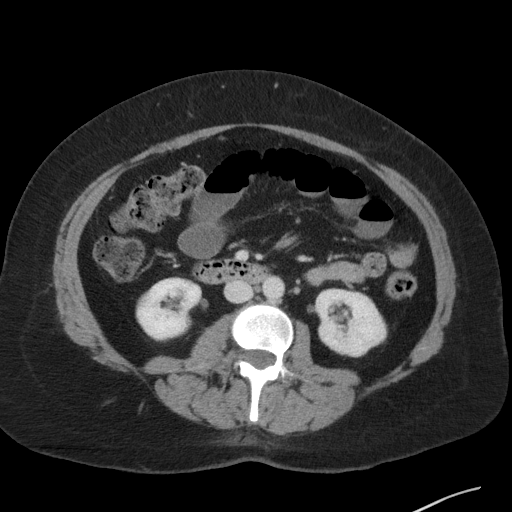
[im 57/91  soft-tissue]
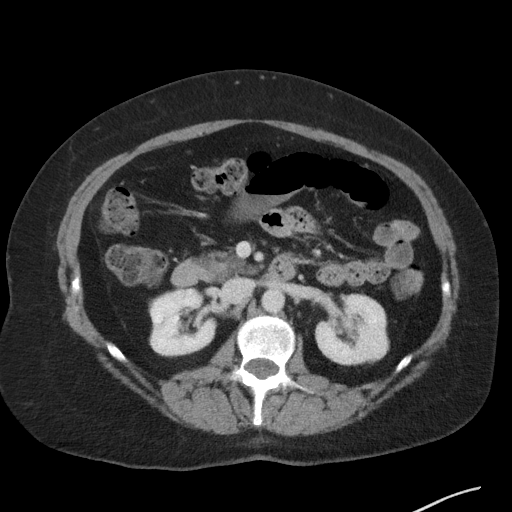
[im 57/91  bone]
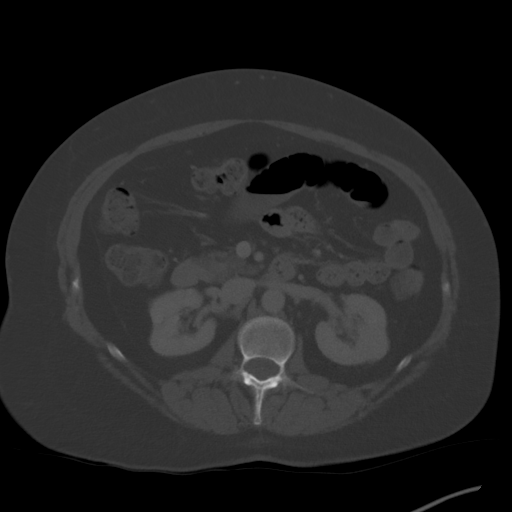
[im 62/91  soft-tissue]
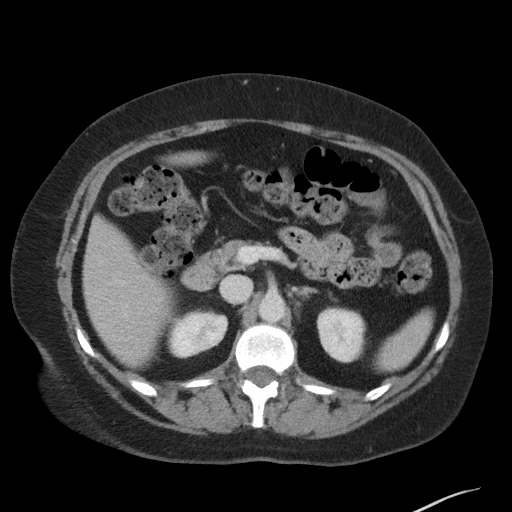
[im 74/91  soft-tissue]
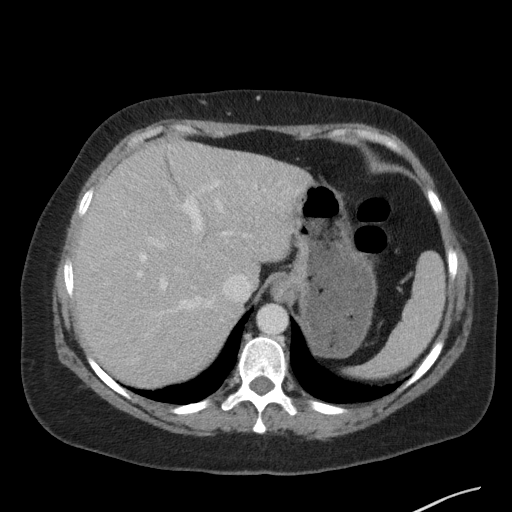
[im 79/91  soft-tissue]
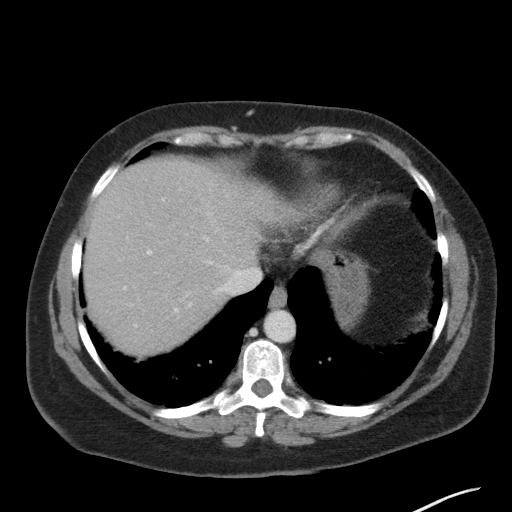
[im 85/91  soft-tissue]
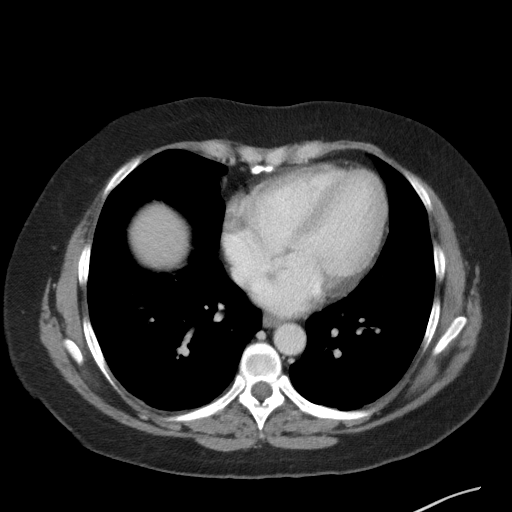

[Series 5: coronal st · coronal · 0.74mm/px · 3 of 86 slices shown]
[im 29/86  soft-tissue]
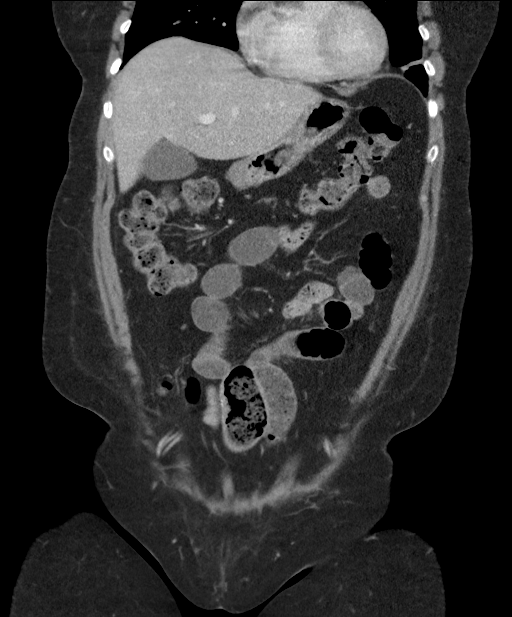
[im 38/86  soft-tissue]
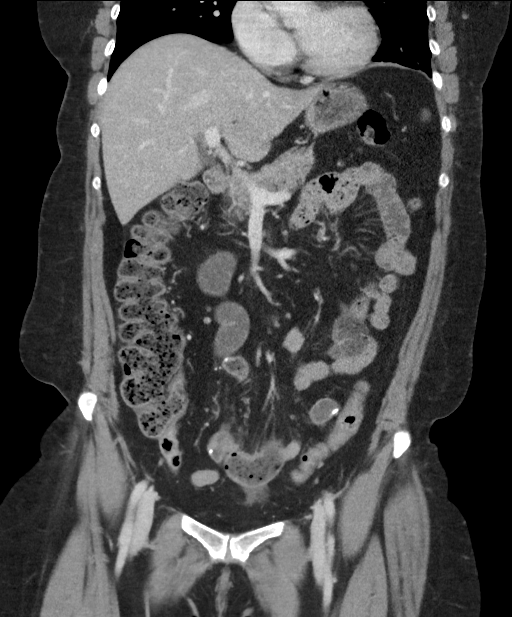
[im 48/86  soft-tissue]
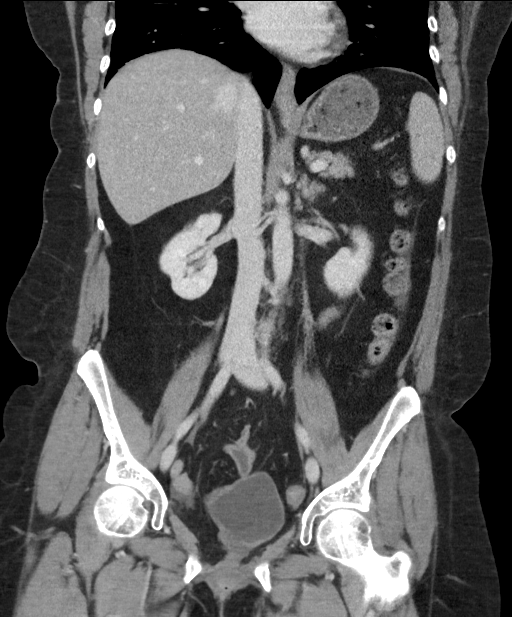

[16 of 46 positions shown; findings below may reference images not displayed]

FINDINGS: Lower chest: Linear atelectasis in the lingula and to a lesser
extent lower lobes. No pleural effusion.

Hepatobiliary: No focal liver abnormality is seen. No gallstones,
gallbladder wall thickening, or biliary dilatation.

Pancreas: No ductal dilatation or inflammation.

Spleen: Normal in size without focal abnormality.

Adrenals/Urinary Tract: No adrenal nodule. No hydronephrosis. No
perinephric edema. Mild cortical scarring in the right kidney. There
is symmetric excretion on delayed phase imaging. Irregularly-shaped
urinary bladder, patient with history of ileal neobladder. This is
not significantly changed in appearance from prior exam.

Stomach/Bowel: Stomach mildly distended. There are dilated
fluid-filled small bowel loops in the lower abdomen and pelvis.
Fecalization of small bowel contents with transition point adjacent
to enteric sutures in the lower abdomen, series 2, image 60. More
distal small bowel is decompressed. There is no pneumatosis or
mesenteric edema. Moderate stool burden in the colon. Appendectomy.
Occasional sigmoid colonic diverticula without diverticulitis.

Vascular/Lymphatic: Normal caliber abdominal aorta. The portal vein
is patent. No adenopathy.

Reproductive: Status post hysterectomy. No adnexal masses.

Other: Trace free fluid in the pelvis. No free air or organized
fluid collection. Tiny fat containing umbilical hernia.

Musculoskeletal: There are no acute or suspicious osseous
abnormalities.
IMPRESSION: 1. Low-grade small-bowel obstruction with transition point adjacent
to enteric sutures in the lower abdomen, likely due to adhesions.
2. Minimal sigmoid colonic diverticulosis without diverticulitis.

## 2022-07-23 DIAGNOSIS — M797 Fibromyalgia: Secondary | ICD-10-CM | POA: Diagnosis not present

## 2022-07-23 DIAGNOSIS — G90512 Complex regional pain syndrome I of left upper limb: Secondary | ICD-10-CM | POA: Diagnosis not present

## 2022-07-23 DIAGNOSIS — G894 Chronic pain syndrome: Secondary | ICD-10-CM | POA: Diagnosis not present

## 2022-07-23 IMAGING — DX DG CHEST 1V PORT SAME DAY
1 series · 1 of 1 positions shown · non-contrast
Comparison: Earlier this day.

CLINICAL DATA: NG tube readjustment.

EXAM:
PORTABLE CHEST 1 VIEW

[chest ap]
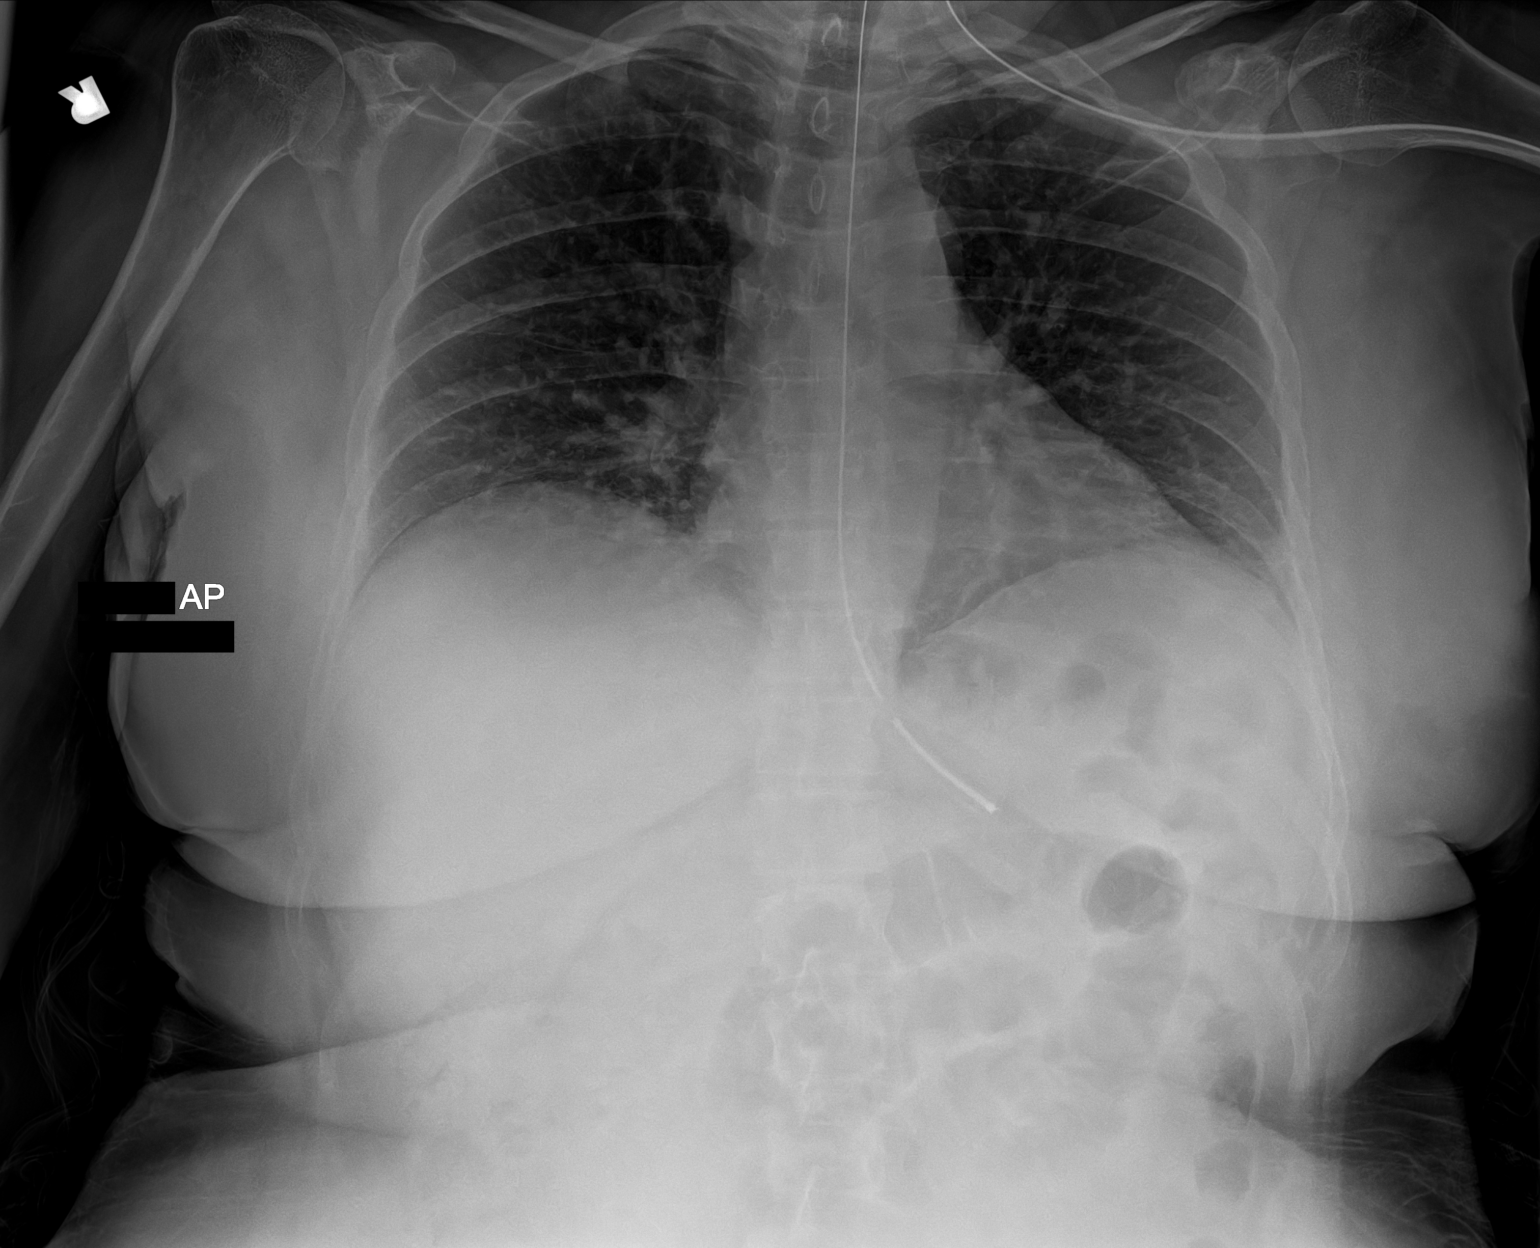

[1 of 1 positions shown; findings below may reference images not displayed]

FINDINGS: The enteric tube has been advanced. The tip of the enteric tube is
below the diaphragm, the side port is just beyond the
gastroesophageal junction. Dilated small bowel in the upper abdomen.
Mild bibasilar atelectasis.
IMPRESSION: 1. Enteric tube tip below the diaphragm, side port just beyond the
gastroesophageal junction.
2. Dilated small bowel in the upper abdomen.

## 2022-08-19 DIAGNOSIS — R339 Retention of urine, unspecified: Secondary | ICD-10-CM | POA: Diagnosis not present

## 2022-09-17 DIAGNOSIS — Z79899 Other long term (current) drug therapy: Secondary | ICD-10-CM | POA: Diagnosis not present

## 2022-09-17 DIAGNOSIS — G894 Chronic pain syndrome: Secondary | ICD-10-CM | POA: Diagnosis not present

## 2022-09-17 DIAGNOSIS — Z5181 Encounter for therapeutic drug level monitoring: Secondary | ICD-10-CM | POA: Diagnosis not present

## 2022-09-17 DIAGNOSIS — M797 Fibromyalgia: Secondary | ICD-10-CM | POA: Diagnosis not present

## 2022-09-18 DIAGNOSIS — R339 Retention of urine, unspecified: Secondary | ICD-10-CM | POA: Diagnosis not present

## 2022-10-12 DIAGNOSIS — E559 Vitamin D deficiency, unspecified: Secondary | ICD-10-CM | POA: Diagnosis not present

## 2022-10-13 DIAGNOSIS — E559 Vitamin D deficiency, unspecified: Secondary | ICD-10-CM | POA: Diagnosis not present

## 2022-10-19 DIAGNOSIS — R339 Retention of urine, unspecified: Secondary | ICD-10-CM | POA: Diagnosis not present

## 2022-10-22 DIAGNOSIS — M47816 Spondylosis without myelopathy or radiculopathy, lumbar region: Secondary | ICD-10-CM | POA: Diagnosis not present

## 2022-10-22 DIAGNOSIS — G894 Chronic pain syndrome: Secondary | ICD-10-CM | POA: Diagnosis not present

## 2022-10-22 DIAGNOSIS — M797 Fibromyalgia: Secondary | ICD-10-CM | POA: Diagnosis not present

## 2022-10-22 DIAGNOSIS — M47812 Spondylosis without myelopathy or radiculopathy, cervical region: Secondary | ICD-10-CM | POA: Diagnosis not present

## 2022-10-22 DIAGNOSIS — G90512 Complex regional pain syndrome I of left upper limb: Secondary | ICD-10-CM | POA: Diagnosis not present

## 2022-10-26 DIAGNOSIS — H52223 Regular astigmatism, bilateral: Secondary | ICD-10-CM | POA: Diagnosis not present

## 2022-10-26 DIAGNOSIS — H524 Presbyopia: Secondary | ICD-10-CM | POA: Diagnosis not present

## 2022-11-04 DIAGNOSIS — Z9889 Other specified postprocedural states: Secondary | ICD-10-CM | POA: Diagnosis not present

## 2022-11-04 DIAGNOSIS — N3010 Interstitial cystitis (chronic) without hematuria: Secondary | ICD-10-CM | POA: Diagnosis not present

## 2022-11-04 DIAGNOSIS — N2889 Other specified disorders of kidney and ureter: Secondary | ICD-10-CM | POA: Diagnosis not present

## 2022-11-18 DIAGNOSIS — R339 Retention of urine, unspecified: Secondary | ICD-10-CM | POA: Diagnosis not present

## 2022-12-03 DIAGNOSIS — M47816 Spondylosis without myelopathy or radiculopathy, lumbar region: Secondary | ICD-10-CM | POA: Diagnosis not present

## 2022-12-03 DIAGNOSIS — G894 Chronic pain syndrome: Secondary | ICD-10-CM | POA: Diagnosis not present

## 2022-12-03 DIAGNOSIS — N3010 Interstitial cystitis (chronic) without hematuria: Secondary | ICD-10-CM | POA: Diagnosis not present

## 2022-12-03 DIAGNOSIS — M47812 Spondylosis without myelopathy or radiculopathy, cervical region: Secondary | ICD-10-CM | POA: Diagnosis not present

## 2022-12-18 DIAGNOSIS — R339 Retention of urine, unspecified: Secondary | ICD-10-CM | POA: Diagnosis not present

## 2022-12-28 DIAGNOSIS — M810 Age-related osteoporosis without current pathological fracture: Secondary | ICD-10-CM | POA: Diagnosis not present

## 2022-12-31 DIAGNOSIS — G894 Chronic pain syndrome: Secondary | ICD-10-CM | POA: Diagnosis not present

## 2022-12-31 DIAGNOSIS — N3010 Interstitial cystitis (chronic) without hematuria: Secondary | ICD-10-CM | POA: Diagnosis not present

## 2022-12-31 DIAGNOSIS — Z5181 Encounter for therapeutic drug level monitoring: Secondary | ICD-10-CM | POA: Diagnosis not present

## 2022-12-31 DIAGNOSIS — Z79899 Other long term (current) drug therapy: Secondary | ICD-10-CM | POA: Diagnosis not present

## 2022-12-31 DIAGNOSIS — M47816 Spondylosis without myelopathy or radiculopathy, lumbar region: Secondary | ICD-10-CM | POA: Diagnosis not present

## 2022-12-31 DIAGNOSIS — M47812 Spondylosis without myelopathy or radiculopathy, cervical region: Secondary | ICD-10-CM | POA: Diagnosis not present

## 2022-12-31 DIAGNOSIS — G90512 Complex regional pain syndrome I of left upper limb: Secondary | ICD-10-CM | POA: Diagnosis not present

## 2023-01-07 ENCOUNTER — Ambulatory Visit: Payer: Medicare HMO | Admitting: Neurology

## 2023-01-19 DIAGNOSIS — R339 Retention of urine, unspecified: Secondary | ICD-10-CM | POA: Diagnosis not present

## 2023-02-17 DIAGNOSIS — R339 Retention of urine, unspecified: Secondary | ICD-10-CM | POA: Diagnosis not present

## 2023-02-25 DIAGNOSIS — G894 Chronic pain syndrome: Secondary | ICD-10-CM | POA: Diagnosis not present

## 2023-02-25 DIAGNOSIS — M797 Fibromyalgia: Secondary | ICD-10-CM | POA: Diagnosis not present

## 2023-02-25 DIAGNOSIS — N3010 Interstitial cystitis (chronic) without hematuria: Secondary | ICD-10-CM | POA: Diagnosis not present

## 2023-02-25 DIAGNOSIS — M47812 Spondylosis without myelopathy or radiculopathy, cervical region: Secondary | ICD-10-CM | POA: Diagnosis not present

## 2023-03-16 DIAGNOSIS — D529 Folate deficiency anemia, unspecified: Secondary | ICD-10-CM | POA: Diagnosis not present

## 2023-03-16 DIAGNOSIS — D649 Anemia, unspecified: Secondary | ICD-10-CM | POA: Diagnosis not present

## 2023-03-16 DIAGNOSIS — D519 Vitamin B12 deficiency anemia, unspecified: Secondary | ICD-10-CM | POA: Diagnosis not present

## 2023-03-22 DIAGNOSIS — R339 Retention of urine, unspecified: Secondary | ICD-10-CM | POA: Diagnosis not present

## 2023-04-01 DIAGNOSIS — K080 Exfoliation of teeth due to systemic causes: Secondary | ICD-10-CM | POA: Diagnosis not present

## 2023-04-07 DIAGNOSIS — Z79899 Other long term (current) drug therapy: Secondary | ICD-10-CM | POA: Diagnosis not present

## 2023-04-07 DIAGNOSIS — G894 Chronic pain syndrome: Secondary | ICD-10-CM | POA: Diagnosis not present

## 2023-04-07 DIAGNOSIS — M797 Fibromyalgia: Secondary | ICD-10-CM | POA: Diagnosis not present

## 2023-04-07 DIAGNOSIS — Z5181 Encounter for therapeutic drug level monitoring: Secondary | ICD-10-CM | POA: Diagnosis not present

## 2023-04-07 DIAGNOSIS — N3010 Interstitial cystitis (chronic) without hematuria: Secondary | ICD-10-CM | POA: Diagnosis not present

## 2023-04-21 DIAGNOSIS — R339 Retention of urine, unspecified: Secondary | ICD-10-CM | POA: Diagnosis not present

## 2023-05-14 DIAGNOSIS — R92323 Mammographic fibroglandular density, bilateral breasts: Secondary | ICD-10-CM | POA: Diagnosis not present

## 2023-05-14 DIAGNOSIS — Z1231 Encounter for screening mammogram for malignant neoplasm of breast: Secondary | ICD-10-CM | POA: Diagnosis not present

## 2023-06-15 DIAGNOSIS — M6289 Other specified disorders of muscle: Secondary | ICD-10-CM | POA: Diagnosis not present

## 2023-06-15 DIAGNOSIS — Z01419 Encounter for gynecological examination (general) (routine) without abnormal findings: Secondary | ICD-10-CM | POA: Diagnosis not present

## 2023-06-15 DIAGNOSIS — Z8541 Personal history of malignant neoplasm of cervix uteri: Secondary | ICD-10-CM | POA: Diagnosis not present

## 2023-06-15 DIAGNOSIS — Z124 Encounter for screening for malignant neoplasm of cervix: Secondary | ICD-10-CM | POA: Diagnosis not present

## 2023-06-15 DIAGNOSIS — Z906 Acquired absence of other parts of urinary tract: Secondary | ICD-10-CM | POA: Diagnosis not present

## 2023-06-21 DIAGNOSIS — R339 Retention of urine, unspecified: Secondary | ICD-10-CM | POA: Diagnosis not present

## 2023-07-07 DIAGNOSIS — M47812 Spondylosis without myelopathy or radiculopathy, cervical region: Secondary | ICD-10-CM | POA: Diagnosis not present

## 2023-07-07 DIAGNOSIS — G894 Chronic pain syndrome: Secondary | ICD-10-CM | POA: Diagnosis not present

## 2023-07-07 DIAGNOSIS — M797 Fibromyalgia: Secondary | ICD-10-CM | POA: Diagnosis not present

## 2023-07-07 DIAGNOSIS — G90512 Complex regional pain syndrome I of left upper limb: Secondary | ICD-10-CM | POA: Diagnosis not present

## 2023-07-14 DIAGNOSIS — D559 Anemia due to enzyme disorder, unspecified: Secondary | ICD-10-CM | POA: Diagnosis not present

## 2023-07-14 DIAGNOSIS — Z131 Encounter for screening for diabetes mellitus: Secondary | ICD-10-CM | POA: Diagnosis not present

## 2023-07-14 DIAGNOSIS — E038 Other specified hypothyroidism: Secondary | ICD-10-CM | POA: Diagnosis not present

## 2023-07-14 DIAGNOSIS — E559 Vitamin D deficiency, unspecified: Secondary | ICD-10-CM | POA: Diagnosis not present

## 2023-07-14 DIAGNOSIS — Z13220 Encounter for screening for lipoid disorders: Secondary | ICD-10-CM | POA: Diagnosis not present

## 2023-07-21 DIAGNOSIS — R002 Palpitations: Secondary | ICD-10-CM | POA: Diagnosis not present

## 2023-07-21 DIAGNOSIS — R079 Chest pain, unspecified: Secondary | ICD-10-CM | POA: Diagnosis not present

## 2023-07-21 DIAGNOSIS — Z1389 Encounter for screening for other disorder: Secondary | ICD-10-CM | POA: Diagnosis not present

## 2023-07-21 DIAGNOSIS — Z6827 Body mass index (BMI) 27.0-27.9, adult: Secondary | ICD-10-CM | POA: Diagnosis not present

## 2023-07-21 DIAGNOSIS — R42 Dizziness and giddiness: Secondary | ICD-10-CM | POA: Diagnosis not present

## 2023-07-21 DIAGNOSIS — Z0000 Encounter for general adult medical examination without abnormal findings: Secondary | ICD-10-CM | POA: Diagnosis not present

## 2023-07-21 DIAGNOSIS — Z1331 Encounter for screening for depression: Secondary | ICD-10-CM | POA: Diagnosis not present

## 2023-07-21 DIAGNOSIS — Z0001 Encounter for general adult medical examination with abnormal findings: Secondary | ICD-10-CM | POA: Diagnosis not present

## 2023-08-20 DIAGNOSIS — R339 Retention of urine, unspecified: Secondary | ICD-10-CM | POA: Diagnosis not present

## 2023-09-01 DIAGNOSIS — G90512 Complex regional pain syndrome I of left upper limb: Secondary | ICD-10-CM | POA: Diagnosis not present

## 2023-09-01 DIAGNOSIS — G894 Chronic pain syndrome: Secondary | ICD-10-CM | POA: Diagnosis not present

## 2023-09-01 DIAGNOSIS — M797 Fibromyalgia: Secondary | ICD-10-CM | POA: Diagnosis not present

## 2023-09-01 DIAGNOSIS — Z5181 Encounter for therapeutic drug level monitoring: Secondary | ICD-10-CM | POA: Diagnosis not present

## 2023-09-01 DIAGNOSIS — Z79899 Other long term (current) drug therapy: Secondary | ICD-10-CM | POA: Diagnosis not present

## 2023-09-01 DIAGNOSIS — M47812 Spondylosis without myelopathy or radiculopathy, cervical region: Secondary | ICD-10-CM | POA: Diagnosis not present

## 2023-09-21 DIAGNOSIS — R339 Retention of urine, unspecified: Secondary | ICD-10-CM | POA: Diagnosis not present

## 2023-10-20 DIAGNOSIS — E038 Other specified hypothyroidism: Secondary | ICD-10-CM | POA: Diagnosis not present

## 2023-10-20 DIAGNOSIS — Z0001 Encounter for general adult medical examination with abnormal findings: Secondary | ICD-10-CM | POA: Diagnosis not present

## 2023-10-20 DIAGNOSIS — D559 Anemia due to enzyme disorder, unspecified: Secondary | ICD-10-CM | POA: Diagnosis not present

## 2023-10-20 DIAGNOSIS — E559 Vitamin D deficiency, unspecified: Secondary | ICD-10-CM | POA: Diagnosis not present

## 2023-10-20 DIAGNOSIS — R42 Dizziness and giddiness: Secondary | ICD-10-CM | POA: Diagnosis not present

## 2023-10-21 DIAGNOSIS — R339 Retention of urine, unspecified: Secondary | ICD-10-CM | POA: Diagnosis not present

## 2023-10-27 DIAGNOSIS — M797 Fibromyalgia: Secondary | ICD-10-CM | POA: Diagnosis not present

## 2023-10-27 DIAGNOSIS — G894 Chronic pain syndrome: Secondary | ICD-10-CM | POA: Diagnosis not present

## 2023-10-27 DIAGNOSIS — N3010 Interstitial cystitis (chronic) without hematuria: Secondary | ICD-10-CM | POA: Diagnosis not present

## 2023-10-27 DIAGNOSIS — G90512 Complex regional pain syndrome I of left upper limb: Secondary | ICD-10-CM | POA: Diagnosis not present

## 2023-11-03 DIAGNOSIS — Z9889 Other specified postprocedural states: Secondary | ICD-10-CM | POA: Diagnosis not present

## 2023-11-03 DIAGNOSIS — N3010 Interstitial cystitis (chronic) without hematuria: Secondary | ICD-10-CM | POA: Diagnosis not present

## 2023-11-03 DIAGNOSIS — R310 Gross hematuria: Secondary | ICD-10-CM | POA: Diagnosis not present

## 2023-11-18 DIAGNOSIS — R030 Elevated blood-pressure reading, without diagnosis of hypertension: Secondary | ICD-10-CM | POA: Diagnosis not present

## 2023-11-18 DIAGNOSIS — N390 Urinary tract infection, site not specified: Secondary | ICD-10-CM | POA: Diagnosis not present

## 2023-11-22 DIAGNOSIS — R339 Retention of urine, unspecified: Secondary | ICD-10-CM | POA: Diagnosis not present

## 2023-11-23 DIAGNOSIS — Z9889 Other specified postprocedural states: Secondary | ICD-10-CM | POA: Diagnosis not present

## 2023-11-23 DIAGNOSIS — R310 Gross hematuria: Secondary | ICD-10-CM | POA: Diagnosis not present

## 2023-12-02 DIAGNOSIS — N200 Calculus of kidney: Secondary | ICD-10-CM | POA: Diagnosis not present

## 2023-12-02 DIAGNOSIS — R310 Gross hematuria: Secondary | ICD-10-CM | POA: Diagnosis not present

## 2023-12-22 DIAGNOSIS — G90512 Complex regional pain syndrome I of left upper limb: Secondary | ICD-10-CM | POA: Diagnosis not present

## 2023-12-22 DIAGNOSIS — N3010 Interstitial cystitis (chronic) without hematuria: Secondary | ICD-10-CM | POA: Diagnosis not present

## 2023-12-22 DIAGNOSIS — G894 Chronic pain syndrome: Secondary | ICD-10-CM | POA: Diagnosis not present

## 2023-12-22 DIAGNOSIS — Z5181 Encounter for therapeutic drug level monitoring: Secondary | ICD-10-CM | POA: Diagnosis not present

## 2023-12-22 DIAGNOSIS — M797 Fibromyalgia: Secondary | ICD-10-CM | POA: Diagnosis not present

## 2023-12-22 DIAGNOSIS — Z79899 Other long term (current) drug therapy: Secondary | ICD-10-CM | POA: Diagnosis not present

## 2023-12-27 DIAGNOSIS — H52223 Regular astigmatism, bilateral: Secondary | ICD-10-CM | POA: Diagnosis not present

## 2024-01-10 DIAGNOSIS — E038 Other specified hypothyroidism: Secondary | ICD-10-CM | POA: Diagnosis not present

## 2024-01-10 DIAGNOSIS — Z0001 Encounter for general adult medical examination with abnormal findings: Secondary | ICD-10-CM | POA: Diagnosis not present

## 2024-01-10 DIAGNOSIS — R5382 Chronic fatigue, unspecified: Secondary | ICD-10-CM | POA: Diagnosis not present

## 2024-01-10 DIAGNOSIS — R42 Dizziness and giddiness: Secondary | ICD-10-CM | POA: Diagnosis not present

## 2024-01-10 DIAGNOSIS — E559 Vitamin D deficiency, unspecified: Secondary | ICD-10-CM | POA: Diagnosis not present

## 2024-01-10 DIAGNOSIS — I10 Essential (primary) hypertension: Secondary | ICD-10-CM | POA: Diagnosis not present

## 2024-01-18 DIAGNOSIS — I10 Essential (primary) hypertension: Secondary | ICD-10-CM | POA: Diagnosis not present

## 2024-01-18 DIAGNOSIS — R42 Dizziness and giddiness: Secondary | ICD-10-CM | POA: Diagnosis not present

## 2024-01-18 DIAGNOSIS — Z23 Encounter for immunization: Secondary | ICD-10-CM | POA: Diagnosis not present

## 2024-01-18 DIAGNOSIS — R079 Chest pain, unspecified: Secondary | ICD-10-CM | POA: Diagnosis not present

## 2024-01-18 DIAGNOSIS — Z6827 Body mass index (BMI) 27.0-27.9, adult: Secondary | ICD-10-CM | POA: Diagnosis not present

## 2024-01-18 DIAGNOSIS — R002 Palpitations: Secondary | ICD-10-CM | POA: Diagnosis not present

## 2024-02-16 DIAGNOSIS — M546 Pain in thoracic spine: Secondary | ICD-10-CM | POA: Diagnosis not present

## 2024-02-16 DIAGNOSIS — G90512 Complex regional pain syndrome I of left upper limb: Secondary | ICD-10-CM | POA: Diagnosis not present

## 2024-02-16 DIAGNOSIS — M797 Fibromyalgia: Secondary | ICD-10-CM | POA: Diagnosis not present

## 2024-02-16 DIAGNOSIS — N3010 Interstitial cystitis (chronic) without hematuria: Secondary | ICD-10-CM | POA: Diagnosis not present
